# Patient Record
Sex: Female | Born: 1970
Health system: Southern US, Community
[De-identification: ages and names within clinical notes are randomized; demographics above are authoritative.]

## PROBLEM LIST (undated history)

## (undated) DIAGNOSIS — K589 Irritable bowel syndrome without diarrhea: Secondary | ICD-10-CM

## (undated) DIAGNOSIS — F32A Depression, unspecified: Secondary | ICD-10-CM

## (undated) DIAGNOSIS — Z860100 Personal history of colon polyps, unspecified: Secondary | ICD-10-CM

## (undated) DIAGNOSIS — T8859XA Other complications of anesthesia, initial encounter: Secondary | ICD-10-CM

## (undated) DIAGNOSIS — R7303 Prediabetes: Secondary | ICD-10-CM

## (undated) DIAGNOSIS — K921 Melena: Secondary | ICD-10-CM

## (undated) DIAGNOSIS — F329 Major depressive disorder, single episode, unspecified: Secondary | ICD-10-CM

## (undated) DIAGNOSIS — F909 Attention-deficit hyperactivity disorder, unspecified type: Secondary | ICD-10-CM

## (undated) DIAGNOSIS — T4145XA Adverse effect of unspecified anesthetic, initial encounter: Secondary | ICD-10-CM

## (undated) DIAGNOSIS — K5790 Diverticulosis of intestine, part unspecified, without perforation or abscess without bleeding: Secondary | ICD-10-CM

## (undated) DIAGNOSIS — I8390 Asymptomatic varicose veins of unspecified lower extremity: Secondary | ICD-10-CM

## (undated) DIAGNOSIS — G2581 Restless legs syndrome: Secondary | ICD-10-CM

## (undated) DIAGNOSIS — J45909 Unspecified asthma, uncomplicated: Secondary | ICD-10-CM

## (undated) DIAGNOSIS — K219 Gastro-esophageal reflux disease without esophagitis: Secondary | ICD-10-CM

## (undated) DIAGNOSIS — G43909 Migraine, unspecified, not intractable, without status migrainosus: Secondary | ICD-10-CM

## (undated) DIAGNOSIS — T7840XA Allergy, unspecified, initial encounter: Secondary | ICD-10-CM

## (undated) DIAGNOSIS — M1990 Unspecified osteoarthritis, unspecified site: Secondary | ICD-10-CM

## (undated) DIAGNOSIS — M5430 Sciatica, unspecified side: Secondary | ICD-10-CM

## (undated) DIAGNOSIS — N6489 Other specified disorders of breast: Secondary | ICD-10-CM

## (undated) DIAGNOSIS — E785 Hyperlipidemia, unspecified: Secondary | ICD-10-CM

## (undated) DIAGNOSIS — K649 Unspecified hemorrhoids: Secondary | ICD-10-CM

## (undated) DIAGNOSIS — F419 Anxiety disorder, unspecified: Secondary | ICD-10-CM

## (undated) DIAGNOSIS — F4310 Post-traumatic stress disorder, unspecified: Secondary | ICD-10-CM

## (undated) DIAGNOSIS — S8000XA Contusion of unspecified knee, initial encounter: Secondary | ICD-10-CM

## (undated) DIAGNOSIS — K5900 Constipation, unspecified: Secondary | ICD-10-CM

## (undated) HISTORY — DX: Contusion of unspecified knee, initial encounter: S80.00XA

## (undated) HISTORY — PX: UPPER GI ENDOSCOPY: SHX6162

## (undated) HISTORY — PX: HEMORRHOID BANDING: SHX5850

## (undated) HISTORY — DX: Depression, unspecified: F32.A

## (undated) HISTORY — DX: Allergy, unspecified, initial encounter: T78.40XA

## (undated) HISTORY — DX: Gastro-esophageal reflux disease without esophagitis: K21.9

## (undated) HISTORY — DX: Hyperlipidemia, unspecified: E78.5

## (undated) HISTORY — DX: Melena: K92.1

## (undated) HISTORY — DX: Attention-deficit hyperactivity disorder, unspecified type: F90.9

## (undated) HISTORY — DX: Anxiety disorder, unspecified: F41.9

## (undated) HISTORY — DX: Major depressive disorder, single episode, unspecified: F32.9

## (undated) HISTORY — DX: Post-traumatic stress disorder, unspecified: F43.10

## (undated) HISTORY — DX: Other specified disorders of breast: N64.89

## (undated) HISTORY — PX: COLONOSCOPY: SHX174

## (undated) HISTORY — DX: Unspecified osteoarthritis, unspecified site: M19.90

## (undated) HISTORY — PX: RADIOFREQUENCY ABLATION NERVES: SUR1070

---

## 2007-03-02 ENCOUNTER — Ambulatory Visit (HOSPITAL_COMMUNITY): Admission: RE | Admit: 2007-03-02 | Discharge: 2007-03-02 | Payer: Self-pay | Admitting: *Deleted

## 2007-03-02 ENCOUNTER — Encounter (INDEPENDENT_AMBULATORY_CARE_PROVIDER_SITE_OTHER): Payer: Self-pay | Admitting: Specialist

## 2007-04-16 ENCOUNTER — Ambulatory Visit (HOSPITAL_COMMUNITY): Admission: RE | Admit: 2007-04-16 | Discharge: 2007-04-16 | Payer: Self-pay | Admitting: *Deleted

## 2010-07-14 ENCOUNTER — Other Ambulatory Visit: Admission: RE | Admit: 2010-07-14 | Discharge: 2010-07-14 | Payer: Self-pay | Admitting: Family Medicine

## 2010-08-04 ENCOUNTER — Encounter: Admission: RE | Admit: 2010-08-04 | Discharge: 2010-08-04 | Payer: Self-pay | Admitting: Family Medicine

## 2010-09-03 ENCOUNTER — Encounter: Admission: RE | Admit: 2010-09-03 | Discharge: 2010-09-03 | Payer: Self-pay | Admitting: Surgery

## 2010-10-31 LAB — HM DIABETES EYE EXAM

## 2010-11-20 ENCOUNTER — Encounter: Payer: Self-pay | Admitting: Surgery

## 2011-03-15 NOTE — Op Note (Signed)
NAMEAVY, BARLETT                  ACCOUNT NO.:  [id number]   MEDICAL RECORD NO.:  [id number]          PATIENT TYPE:  AMB   LOCATION:  ENDO                         FACILITY:  [facility]   PHYSICIAN:  Georgiana Spinner, M.D.    DATE OF BIRTH:  [birth year]   DATE OF PROCEDURE:  04/16/2007  DATE OF DISCHARGE:                               OPERATIVE REPORT   PROCEDURE:  Colonoscopy.   INDICATIONS:  Colon cancer screening with a family history of colon  polyps, colon cancer occurring at age 77 in her father.   ANESTHESIA:  Demerol 120 mg, Benadryl 50 mg, Versed 12.5 mg.   PROCEDURE:  With the patient mildly sedated in the left lateral  decubitus position, the Pentax videoscopic colonoscope was inserted in  the rectum and passed rather easily to the cecum identified by the  ileocecal valve and appendiceal orifice both of which were photographed.  From this point the colonoscope was slowly withdrawn taking  circumferential views of colonic mucosa stopping only in the rectum  which appeared normal on direct and showed hemorrhoids on retroflex  view.  The endoscope was straightened and withdrawn.  The patient's  vital signs and pulse oximeter remained stable.  The patient tolerated  the procedure well without apparent complications.   FINDINGS:  Internal hemorrhoids, otherwise unremarkable exam.   PLAN:  Repeat examination in 5 years due to family history.           ______________________________  Georgiana Spinner, M.D.     GMO/MEDQ  D:  04/16/2007  T:  04/16/2007  Job:  [id number]

## 2011-03-18 NOTE — Op Note (Signed)
Pamela Novak, Pamela Novak                  ACCOUNT NO.:  [id number]   MEDICAL RECORD NO.:  [id number]          PATIENT TYPE:  AMB   LOCATION:  ENDO                         FACILITY:  [facility]   PHYSICIAN:  Georgiana Spinner, M.D.    DATE OF BIRTH:  1970-12-10   DATE OF PROCEDURE:  03/02/2007  DATE OF DISCHARGE:                               OPERATIVE REPORT   PROCEDURE:  Upper endoscopy.   INDICATIONS:  Abdominal pain.   ANESTHESIA:  Demerol 100 mg, Versed 10 mg.   PROCEDURE:  With the patient mildly sedated in the left lateral  decubitus position, the Pentax videoscopic endoscope was inserted in the  mouth and passed under direct vision through the esophagus, which  appeared normal except the distal esophagus showed some changes of  possible reflux.  This was photographed and biopsied.  We entered into  the stomach.  Fundus, body, antrum, duodenal bulb, second portion of the  duodenum were all visualized.  From this point the endoscope was slowly  withdrawn taking circumferential views of the duodenal and gastric  mucosa until the endoscope was pulled back into the stomach, placed in  retroflexion to view the stomach from below.  The endoscope was  straightened and withdrawn taking circumferential views of the remaining  gastric and esophageal mucosa.  The patient's vital signs and pulse  oximetry remained stable.  The patient tolerated the procedure well  without apparent complications.   FINDINGS:  1. A fairly unremarkable examination.  2. Biopsy of distal esophagus.   PLAN:  Await biopsy report.  Proceed to CT scan of abdomen and pelvis to  evaluate further.           ______________________________  Georgiana Spinner, M.D.     GMO/MEDQ  D:  03/02/2007  T:  03/02/2007  Job:  [id number]

## 2012-07-31 LAB — HM MAMMOGRAPHY: HM Mammogram: NORMAL

## 2012-07-31 LAB — HM PAP SMEAR: HM Pap smear: NORMAL

## 2012-08-16 ENCOUNTER — Other Ambulatory Visit: Payer: Self-pay | Admitting: Family Medicine

## 2012-08-16 DIAGNOSIS — Z1231 Encounter for screening mammogram for malignant neoplasm of breast: Secondary | ICD-10-CM

## 2012-08-17 ENCOUNTER — Other Ambulatory Visit: Payer: Self-pay | Admitting: Gastroenterology

## 2012-08-17 LAB — HM COLONOSCOPY

## 2012-08-20 ENCOUNTER — Ambulatory Visit
Admission: RE | Admit: 2012-08-20 | Discharge: 2012-08-20 | Disposition: A | Discharge status: Home / Self Care | Payer: BC Managed Care – PPO | Source: Ambulatory Visit | Attending: Family Medicine | Admitting: Family Medicine

## 2012-08-20 DIAGNOSIS — Z1231 Encounter for screening mammogram for malignant neoplasm of breast: Secondary | ICD-10-CM

## 2012-08-23 ENCOUNTER — Other Ambulatory Visit: Payer: Self-pay | Admitting: Obstetrics and Gynecology

## 2012-09-11 ENCOUNTER — Emergency Department (HOSPITAL_COMMUNITY)

## 2012-09-11 ENCOUNTER — Emergency Department (HOSPITAL_COMMUNITY)
Admission: EM | Admit: 2012-09-11 | Discharge: 2012-09-11 | Disposition: A | Discharge status: Home / Self Care | Attending: Emergency Medicine | Admitting: Emergency Medicine

## 2012-09-11 ENCOUNTER — Encounter (HOSPITAL_COMMUNITY): Payer: Self-pay | Admitting: Emergency Medicine

## 2012-09-11 DIAGNOSIS — S2002XA Contusion of left breast, initial encounter: Secondary | ICD-10-CM

## 2012-09-11 DIAGNOSIS — W1789XA Other fall from one level to another, initial encounter: Secondary | ICD-10-CM | POA: Insufficient documentation

## 2012-09-11 DIAGNOSIS — S20219A Contusion of unspecified front wall of thorax, initial encounter: Secondary | ICD-10-CM

## 2012-09-11 DIAGNOSIS — Y929 Unspecified place or not applicable: Secondary | ICD-10-CM | POA: Insufficient documentation

## 2012-09-11 DIAGNOSIS — S2000XA Contusion of breast, unspecified breast, initial encounter: Secondary | ICD-10-CM | POA: Insufficient documentation

## 2012-09-11 DIAGNOSIS — Y9302 Activity, running: Secondary | ICD-10-CM | POA: Insufficient documentation

## 2012-09-11 MED ORDER — KETOROLAC TROMETHAMINE 30 MG/ML IJ SOLN
30.0000 mg | Freq: Once | INTRAMUSCULAR | Status: AC
  Administered 2012-09-11: 30 mg via INTRAVENOUS
  Filled 2012-09-11: qty 1

## 2012-09-11 MED ORDER — OXYCODONE-ACETAMINOPHEN 5-325 MG PO TABS
1.0000 | ORAL_TABLET | Freq: Once | ORAL | Status: AC
  Administered 2012-09-11: 1 via ORAL
  Filled 2012-09-11: qty 1

## 2012-09-11 MED ORDER — OXYCODONE-ACETAMINOPHEN 5-325 MG PO TABS: 1.0000 | ORAL_TABLET | ORAL | Status: DC | PRN

## 2012-09-11 MED ORDER — SODIUM CHLORIDE 0.9 % IV SOLN
Freq: Once | INTRAVENOUS | Status: AC
  Administered 2012-09-11: 21:00:00 via INTRAVENOUS

## 2012-09-11 MED ORDER — HYDROMORPHONE HCL PF 1 MG/ML IJ SOLN
1.0000 mg | Freq: Once | INTRAMUSCULAR | Status: AC
  Administered 2012-09-11: 1 mg via INTRAVENOUS
  Filled 2012-09-11: qty 1

## 2012-09-11 NOTE — ED Provider Notes (Signed)
History     CSN: [id number]  Arrival date & time 09/11/12  2130   First MD Initiated Contact with Patient 09/11/12 2002      Chief Complaint  Patient presents with  . Fall    (Consider location/radiation/quality/duration/timing/severity/associated sxs/prior treatment) HPI Comments: Patient was running in the rain slipped and fell onto laptop that fell from her arms and landed on it's edge.  The edge hit her under the L breast.  Now pain radiates into breast and deep in chest.  Also hit L elbow and knee. She applied ice to the area without relief  Patient is a 41 y.o. female presenting with fall. The history is provided by the patient.  Fall The accident occurred 1 to 2 hours ago. The fall occurred while walking. She fell from a height of 3 to 5 ft. She landed on concrete. Point of impact: L breast and L ribs. The pain is at a severity of 10/10. The pain is severe. She was ambulatory at the scene. There was no entrapment after the fall. There was no drug use involved in the accident. There was no alcohol use involved in the accident. Pertinent negatives include no abdominal pain and no nausea.    History reviewed. No pertinent past medical history.  History reviewed. No pertinent past surgical history.  No family history on file.  History  Substance Use Topics  . Smoking status: Never Smoker   . Smokeless tobacco: Not on file  . Alcohol Use: No    OB History    Grav Para Term Preterm Abortions TAB SAB Ect Mult Living                  Review of Systems  Constitutional: Positive for activity change.  HENT: Negative for neck pain and neck stiffness.   Respiratory: Negative for cough and shortness of breath.   Cardiovascular: Positive for chest pain.  Gastrointestinal: Negative for nausea and abdominal pain.  Musculoskeletal: Negative for back pain.  Skin: Negative for rash and wound.  Neurological: Negative for weakness.  Hematological: Negative.     Psychiatric/Behavioral: Negative.     Allergies  Flagyl  Home Medications   Current Outpatient Rx  Name  Route  Sig  Dispense  Refill  . BUPROPION HCL ER (XL) 150 MG PO TB24   Oral   Take 150 mg by mouth daily.         [name] Kitchen MONTELUKAST SODIUM 10 MG PO TABS   Oral   Take 10 mg by mouth daily.         [name] Kitchen PROBIOTIC DAILY PO   Oral   Take 1 capsule by mouth daily.           BP 124/84  Pulse 96  Temp 98.5 F (36.9 C) (Oral)  Resp 15  SpO2 100%  LMP 09/02/2012  Physical Exam  Nursing note and vitals reviewed. Constitutional: She is oriented to person, place, and time. She appears well-developed and well-nourished.  HENT:  Head: Normocephalic.  Eyes: Pupils are equal, round, and reactive to light.  Neck: Normal range of motion.  Cardiovascular: Normal rate.   Pulmonary/Chest: Effort normal. No respiratory distress. She has no wheezes. She has no rales. She exhibits tenderness.    Abdominal: Soft. Bowel sounds are normal. She exhibits no distension. There is tenderness.  Musculoskeletal: Normal range of motion. She exhibits tenderness.       Left elbow: She exhibits normal range of motion, no swelling, no effusion, no  deformity and no laceration. tenderness found.       Left knee: She exhibits erythema. She exhibits normal range of motion, no swelling, no effusion, no ecchymosis, no deformity, no laceration, normal alignment, no LCL laxity and no bony tenderness. no tenderness found.       Arms: Neurological: She is alert and oriented to person, place, and time.  Skin: Skin is warm. No rash noted. No erythema.    ED Course  Procedures (including critical care time)  Labs Reviewed - No data to display Dg Ribs Unilateral W/chest Left  09/11/2012  *RADIOLOGY REPORT*  Clinical Data: Fall  LEFT RIBS AND CHEST - 3+ VIEW  Comparison: None  Findings: Heart size is normal.  No pleural effusion or edema.  No airspace consolidation identified.  Dedicated views of the left  ribs show no displaced rib fractures.  IMPRESSION:  1.  No acute findings identified.   Original Report Authenticated By: Signa Kell, M.D.      1. Chest wall contusion   2. Contusion of breast, left       MDM   Suspect rib fracture will xray and treat pain  Xray negative for rib fracture will  DC home with pan medication and PCP follow up      Arman Filter, NP 09/11/12 2152  Arman Filter, NP 09/11/12 2209

## 2012-09-11 NOTE — ED Notes (Signed)
Per patient, fell in parking lot at work and lap top  Jammed into left breast-no LOC, did not hit head

## 2012-09-11 NOTE — ED Provider Notes (Signed)
Medical screening examination/treatment/procedure(s) were performed by non-physician practitioner and as supervising physician I was immediately available for consultation/collaboration.  Raeford Razor, MD 09/11/12 [phone]

## 2012-09-11 NOTE — Progress Notes (Signed)
Pt confirms pcp as Pamela Novak EPIC updated

## 2012-09-21 ENCOUNTER — Encounter (HOSPITAL_COMMUNITY): Payer: Self-pay | Admitting: Pharmacist

## 2012-09-26 ENCOUNTER — Encounter (INDEPENDENT_AMBULATORY_CARE_PROVIDER_SITE_OTHER): Payer: Self-pay | Admitting: Surgery

## 2012-09-26 ENCOUNTER — Ambulatory Visit (INDEPENDENT_AMBULATORY_CARE_PROVIDER_SITE_OTHER): Payer: Federal, State, Local not specified - PPO | Admitting: Surgery

## 2012-09-26 VITALS — BP 140/80 | HR 78 | Temp 99.2°F | Resp 18 | Ht 67.0 in | Wt 164.0 lb

## 2012-09-26 DIAGNOSIS — D1739 Benign lipomatous neoplasm of skin and subcutaneous tissue of other sites: Secondary | ICD-10-CM

## 2012-09-26 DIAGNOSIS — D170 Benign lipomatous neoplasm of skin and subcutaneous tissue of head, face and neck: Secondary | ICD-10-CM | POA: Insufficient documentation

## 2012-09-26 DIAGNOSIS — K648 Other hemorrhoids: Secondary | ICD-10-CM

## 2012-09-26 NOTE — Patient Instructions (Signed)
ANORECTAL PROCEDURES: 1.  Tub soaks 2-3 times daily in warm water (may add Epsom salts if desired) 2.  Stool softener for one month (store brand Miralax or Colace) 3.  Avoid toilet paper - use baby wipes or Tucks pads 4.  Increase water intake - 6-8 glasses daily 5.  Apply dry pad to area until drainage stops 

## 2012-09-26 NOTE — Progress Notes (Signed)
General Surgery [facility]  Chief Complaint  Patient presents with  . Follow-up    eval hems and neck lipoma - last seen in 2011    HISTORY: The patient is a 41 year old white female last evaluated in my practice in 2011. She presents today with complaints of an enlarging soft tissue mass at the sternal notch. She is also had persistent problems with internal hemorrhoids noted on colonoscopy and on gynecologic examination. She complains of pain, burning, and intermittent bleeding. She notes prolapse requiring manual reduction. She notes chronic constipation.  Past Medical History  Diagnosis Date  . Anxiety   . Traumatic hematoma of knee   . Breast hematoma      Current Outpatient Prescriptions  Medication Sig Dispense Refill  . buPROPion (WELLBUTRIN XL) 150 MG 24 hr tablet Take 150 mg by mouth daily.      [name] Kitchen ibuprofen (ADVIL,MOTRIN) 800 MG tablet Take 800 mg by mouth every 8 (eight) hours as needed.      . montelukast (SINGULAIR) 10 MG tablet Take 10 mg by mouth daily.      [name] Kitchen oxyCODONE-acetaminophen (PERCOCET) 10-325 MG per tablet Take 1 tablet by mouth every 4 (four) hours as needed.      . pramoxine-hydrocortisone (ANALPRAM HC) cream Apply 1 application topically daily as needed. For hemorrhoids      . Probiotic Product (PROBIOTIC DAILY PO) Take 1 capsule by mouth daily.      [name] Kitchen HYDROcodone-acetaminophen (VICODIN) 5-500 MG per tablet Take 1 tablet by mouth every 4 (four) hours as needed. For pain         Allergies  Allergen Reactions  . Flagyl (Metronidazole) Rash     Family History  Problem Relation Age of Onset  . Heart disease Mother   . Arthritis Mother   . Hyperlipidemia Mother      History   Social History  . Marital Status: Married    Spouse Name: N/A    Number of Children: N/A  . Years of Education: N/A   Social History Main Topics  . Smoking status: Never Smoker   . Smokeless tobacco: None  . Alcohol Use: No  . Drug Use: No  .  Sexually Active:    Other Topics Concern  . None   Social History Narrative  . None     REVIEW OF SYSTEMS - PERTINENT POSITIVES ONLY: Intermittent bleeding with bowel movements. Chronic prolapse requiring manual reduction.  EXAM: Filed Vitals:   09/26/12 0959  BP: 140/80  Pulse: 78  Temp: 99.2 F (37.3 C)  Resp: 18    HEENT: normocephalic; pupils equal and reactive; sclerae clear; dentition good; mucous membranes moist NECK:  symmetric on extension; no palpable anterior or posterior cervical lymphadenopathy; no supraclavicular masses; no tenderness; soft tissue is prominent at the sternal notch; no discrete lipoma; prominent subcutaneous fat pad CHEST: clear to auscultation bilaterally without rales, rhonchi, or wheezes CARDIAC: regular rate and rhythm without significant murmur; peripheral pulses are full GU:  External examination shows circumferential skin tags. There is moderate prolapse. There is no sign of fissure or fistula. There are a few prominent external hemorrhoidal veins. Digital rectal examination shows edema in the anal canal. Anoscopy is performed and a 360 view shows grade 2-3 internal hemorrhoids. There is no ulceration. There is no active bleeding. EXT:  non-tender without edema; no deformity NEURO: no gross focal deficits; no sign of tremor   LABORATORY RESULTS: See [facility] (CHL-Epic) for most recent results  RADIOLOGY RESULTS: See [facility] (CHL-Epic) for most recent results   IMPRESSION: #1 grade 3 internal hemorrhoids with prolapse, no active bleeding #2 prominent fat pad at sternal notch, no evidence of neoplasm  PLAN: I discussed the above findings with the patient and her husband. I recommended that they consider the PPH procedure for management of her hemorrhoid disease. I do not perform that surgery. I am going to ask her to be seen in consultation by my partner, Dr. Chevis Pretty, for consideration for this procedure. I provided  them with written literature to review. I have suggested that she begin taking MiraLAX daily. She will continue with good perineal hygiene.  Consultation with Dr. Chevis Pretty has been arranged.  Velora Heckler, MD, FACS General & Endocrine Surgery [facility]   Visit Diagnoses: 1. Prolapsed internal hemorrhoids, circumferential   2. Lipoma of skin and subcutaneous tissue of neck     Primary Care Physician: Gaye Alken, MD

## 2012-10-01 ENCOUNTER — Ambulatory Visit (INDEPENDENT_AMBULATORY_CARE_PROVIDER_SITE_OTHER): Payer: Federal, State, Local not specified - PPO | Admitting: Internal Medicine

## 2012-10-01 ENCOUNTER — Encounter (HOSPITAL_COMMUNITY)
Admission: RE | Admit: 2012-10-01 | Discharge: 2012-10-01 | Disposition: A | Discharge status: Home / Self Care | Payer: Federal, State, Local not specified - PPO | Source: Ambulatory Visit | Attending: Obstetrics & Gynecology | Admitting: Obstetrics & Gynecology

## 2012-10-01 ENCOUNTER — Encounter: Payer: Self-pay | Admitting: Internal Medicine

## 2012-10-01 ENCOUNTER — Encounter (HOSPITAL_COMMUNITY): Payer: Self-pay

## 2012-10-01 VITALS — BP 120/82 | HR 90 | Temp 100.0°F | Ht 67.0 in | Wt 163.0 lb

## 2012-10-01 DIAGNOSIS — F411 Generalized anxiety disorder: Secondary | ICD-10-CM

## 2012-10-01 DIAGNOSIS — K649 Unspecified hemorrhoids: Secondary | ICD-10-CM

## 2012-10-01 DIAGNOSIS — Z13220 Encounter for screening for lipoid disorders: Secondary | ICD-10-CM

## 2012-10-01 DIAGNOSIS — Z1329 Encounter for screening for other suspected endocrine disorder: Secondary | ICD-10-CM

## 2012-10-01 DIAGNOSIS — Z0000 Encounter for general adult medical examination without abnormal findings: Secondary | ICD-10-CM

## 2012-10-01 DIAGNOSIS — Z130 Encounter for screening for diseases of the blood and blood-forming organs and certain disorders involving the immune mechanism: Secondary | ICD-10-CM

## 2012-10-01 DIAGNOSIS — Z131 Encounter for screening for diabetes mellitus: Secondary | ICD-10-CM

## 2012-10-01 DIAGNOSIS — F419 Anxiety disorder, unspecified: Secondary | ICD-10-CM

## 2012-10-01 DIAGNOSIS — Z1321 Encounter for screening for nutritional disorder: Secondary | ICD-10-CM

## 2012-10-01 HISTORY — DX: Other complications of anesthesia, initial encounter: T88.59XA

## 2012-10-01 HISTORY — DX: Adverse effect of unspecified anesthetic, initial encounter: T41.45XA

## 2012-10-01 LAB — CBC
HCT: 40.5 % (ref 36.0–46.0)
Hemoglobin: 13.7 g/dL (ref 12.0–15.0)
MCH: 28.9 pg (ref 26.0–34.0)
MCHC: 33.8 g/dL (ref 30.0–36.0)
MCV: 85.4 fL (ref 78.0–100.0)
Platelets: 265 10*3/uL (ref 150–400)
RBC: 4.74 MIL/uL (ref 3.87–5.11)
RDW: 12.6 % (ref 11.5–15.5)
WBC: 6.5 10*3/uL (ref 4.0–10.5)

## 2012-10-01 LAB — SURGICAL PCR SCREEN
MRSA, PCR: NEGATIVE
Staphylococcus aureus: NEGATIVE

## 2012-10-01 MED ORDER — BETAMETHASONE DIPROPIONATE AUG 0.05 % EX OINT: TOPICAL_OINTMENT | Freq: Two times a day (BID) | CUTANEOUS | Status: DC

## 2012-10-01 MED ORDER — FLUOXETINE HCL 20 MG PO TABS: 20.0000 mg | ORAL_TABLET | Freq: Every day | ORAL | Status: DC

## 2012-10-01 NOTE — Patient Instructions (Addendum)
20 Pamela Novak  10/01/2012   Your procedure is scheduled on:  10/04/12  Enter through the Main Entrance of [facility] at 1130 AM.  Pick up the phone at the desk and dial [phone].   Call this number if you have problems the morning of surgery: [phone]   Remember:   Do not eat food:After Midnight.  Do not drink clear liquids: 4 Hours before arrival.  Take these medicines the morning of surgery with A SIP OF WATER: Xanax, Wellbutrin, or Pain medication   Do not wear jewelry, make-up or nail polish.  Do not wear lotions, powders, or perfumes. You may wear deodorant.  Do not shave 48 hours prior to surgery.  Do not bring valuables to the hospital.  Contacts, dentures or bridgework may not be worn into surgery.  Leave suitcase in the car. After surgery it may be brought to your room.  For patients admitted to the hospital, checkout time is 11:00 AM the day of discharge.   Patients discharged the day of surgery will not be allowed to drive home.  Name and phone number of your driver: Pamela Novak  spouse  Special Instructions: Shower using CHG 2 nights before surgery and the night before surgery.  If you shower the day of surgery use CHG.  Use special wash - you have one bottle of CHG for all showers.  You should use approximately 1/3 of the bottle for each shower.   Please read over the following fact sheets that you were given: MRSA Information

## 2012-10-01 NOTE — Patient Instructions (Addendum)

## 2012-10-01 NOTE — Progress Notes (Signed)
HPI  Pt presents to the clinic today to establish care. Her only concern is that she wonders if she can change her Wellbutrin to Prozac, to better control her anxiety so that she can use her xanax less frequently. She has had a long standing history of anxiety without panic attacks. She feels like she has been on Wellbutrin so long that maybe it is not as effective as it use to be.  Past Medical History  Diagnosis Date  . Anxiety   . Traumatic hematoma of knee   . Breast hematoma   . Complication of anesthesia     "woke up during colonoscopy"  . Depression   . Allergy   . Hyperlipidemia   . GERD (gastroesophageal reflux disease)   . Blood in stool     Current Outpatient Prescriptions  Medication Sig Dispense Refill  . ALPRAZolam (XANAX) 0.25 MG tablet Take 0.25 mg by mouth 3 (three) times daily as needed.      [name] Kitchen buPROPion (WELLBUTRIN XL) 150 MG 24 hr tablet Take 150 mg by mouth daily.      . montelukast (SINGULAIR) 10 MG tablet Take 10 mg by mouth daily.      [name] Kitchen oxyCODONE-acetaminophen (PERCOCET) 10-325 MG per tablet Take 1 tablet by mouth every 6 (six) hours as needed.       . polyethylene glycol (MIRALAX / GLYCOLAX) packet Take 17 g by mouth daily.      . Probiotic Product (PROBIOTIC DAILY PO) Take 1 capsule by mouth daily.      . traMADol (ULTRAM) 50 MG tablet Take 100 mg by mouth every 6 (six) hours as needed.       . pramoxine-hydrocortisone (ANALPRAM HC) cream Apply 1 application topically daily as needed. For hemorrhoids        Allergies  Allergen Reactions  . Flagyl (Metronidazole) Rash    Family History  Problem Relation Age of Onset  . Heart disease Mother   . Arthritis Mother   . Hyperlipidemia Mother   . Hypertension Mother   . Cancer Father     Colon Cancer-Polyps  . Hypertension Father   . Hashimoto's thyroiditis Sister   . Diabetes Sister   . Cancer Paternal Grandfather     Colon Cancer  . Hashimoto's thyroiditis Sister     History   Social History    . Marital Status: Married    Spouse Name: N/A    Number of Children: 2  . Years of Education: 12+   Occupational History  . adm officer    Social History Main Topics  . Smoking status: Never Smoker   . Smokeless tobacco: Never Used  . Alcohol Use: No  . Drug Use: No  . Sexually Active: Yes   Other Topics Concern  . Not on file   Social History Narrative   Regular exercise-noCaffeine Use-yes    ROS:  Constitutional: Denies fever, malaise, fatigue, headache or abrupt weight changes.  HEENT: Denies eye pain, eye redness, ear pain, ringing in the ears, wax buildup, runny nose, nasal congestion, bloody nose, or sore throat. Respiratory: Denies difficulty breathing, shortness of breath, cough or sputum production.   Cardiovascular: Denies chest pain, chest tightness, palpitations or swelling in the hands or feet.  Gastrointestinal: Pt reports hemorhoids. Denies abdominal pain, bloating, constipation, diarrhea or blood in the stool.  GU: Denies frequency, urgency, pain with urination, blood in urine, odor or discharge. Musculoskeletal: Denies decrease in range of motion, difficulty with gait, muscle pain or joint  pain and swelling.  Skin: Denies redness, rashes, lesions or ulcercations.  Neurological: Denies dizziness, difficulty with memory, difficulty with speech or problems with balance and coordination.   No other specific complaints in a complete review of systems (except as listed in HPI above).  PE:  BP 120/82  Pulse 90  Temp 100 F (37.8 C) (Oral)  Ht 5\' 7"  (1.702 m)  Wt 163 lb (73.936 kg)  BMI 25.53 kg/m2  SpO2 96%  LMP 09/29/2012 Wt Readings from Last 3 Encounters:  10/01/12 163 lb (73.936 kg)  10/01/12 158 lb (71.668 kg)  09/26/12 164 lb (74.39 kg)    General: Appears her stated age, well developed, well nourished in NAD. HEENT: Head: normal shape and size; Eyes: sclera white, no icterus, conjunctiva pink, PERRLA and EOMs intact; Ears: Tm's gray and intact,  normal light reflex; Nose: mucosa pink and moist, septum midline; Throat/Mouth: Teeth present, mucosa pink and moist, no lesions or ulcerations noted.  Neck: Normal range of motion. Neck supple, trachea midline. No massses, lumps or thyromegaly present.  Cardiovascular: Normal rate and rhythm. S1,S2 noted.  No murmur, rubs or gallops noted. No JVD or BLE edema. No carotid bruits noted. Pulmonary/Chest: Normal effort and positive vesicular breath sounds. No respiratory distress. No wheezes, rales or ronchi noted.  Abdomen: Soft and nontender. Normal bowel sounds, no bruits noted. No distention or masses noted. Liver, spleen and kidneys non palpable. Musculoskeletal: Normal range of motion. No signs of joint swelling. No difficulty with gait.  Neurological: Alert and oriented. Cranial nerves II-XII intact. Coordination normal. +DTRs bilaterally. Psychiatric: Mood and affect normal. Behavior is normal. Judgment and thought content normal.     Assessment and Plan:  Preventative Health Maintenance:  Will defer labs today per patient preference. She usually gets them done at her OB/GYN. Not able to get the flu shot today due to being febrile without illness Will defer Tdap until next visit per patient preference. Continue diet and exercise  Anxiety, chronic problem with additional workup required:  Will change Wellbutrin to Prozac 20 mg daily Encouraged use of relaxation techniques  Hemorrhoids, chronic problem with additional workup required:  Scheduled for hemorrhoidectomy on November 10, 2011 Refilled analpram  RTC in 1 month to evaluate effectiveness of prozac

## 2012-10-03 NOTE — H&P (Signed)
Pamela Novak is an 41 y.o. female with menorrhagia, dysmenorrhea, vulvar condyloma who declines hormonal tx.  Pertinent Gynecological History: Menses: flow is excessive with use of 8 pads or tampons on heaviest days and with severe dysmenorrhea Bleeding: dysfunctional uterine bleeding Contraception: vasectomy DES exposure: unknown Blood transfusions: none Sexually transmitted diseases: no past history Previous GYN Procedures: none  Last mammogram: normal Date: 08/20/2012 Last pap: normal Date: 08/24/2012 OB History: G2, P2   Menstrual History: Menarche age: 40 Patient's last menstrual period was 09/02/2012.    Past Medical History  Diagnosis Date  . Anxiety   . Traumatic hematoma of knee   . Breast hematoma   . Complication of anesthesia     "woke up during colonoscopy"  . Depression   . Allergy   . Hyperlipidemia   . GERD (gastroesophageal reflux disease)   . Blood in stool     Past Surgical History  Procedure Date  . Colonoscopy     Family History  Problem Relation Age of Onset  . Heart disease Mother   . Arthritis Mother   . Hyperlipidemia Mother   . Hypertension Mother   . Cancer Father     Colon Cancer-Polyps  . Hypertension Father   . Hashimoto's thyroiditis Sister   . Diabetes Sister   . Cancer Paternal Grandfather     Colon Cancer  . Hashimoto's thyroiditis Sister     Social History:  reports that she has never smoked. She has never used smokeless tobacco. She reports that she does not drink alcohol or use illicit drugs.  Allergies:  Allergies  Allergen Reactions  . Flagyl (Metronidazole) Rash    No prescriptions prior to admission    Review of Systems  Constitutional: Negative for fever and chills.  Eyes: Negative for blurred vision and double vision.  Respiratory: Negative for cough and hemoptysis.   Cardiovascular: Negative for chest pain and palpitations.  Gastrointestinal: Positive for abdominal pain. Negative for nausea and vomiting.   Musculoskeletal: Negative for myalgias.  Neurological: Negative for headaches.    Height 5\' 7"  (1.702 m), weight 162 lb (73.483 kg), last menstrual period 09/02/2012. Physical Exam  Constitutional: She is oriented to person, place, and time. She appears well-developed and well-nourished.  HENT:  Head: Normocephalic and atraumatic.  Neck: Normal range of motion. Neck supple.  Cardiovascular: Normal rate and regular rhythm.   Respiratory: Effort normal and breath sounds normal.  GI: Soft. Bowel sounds are normal. She exhibits no distension. There is tenderness. There is no rebound and no guarding.  Genitourinary: Vagina normal and uterus normal.  Neurological: She is alert and oriented to person, place, and time.  Skin: Skin is warm and dry.  Psychiatric: She has a normal mood and affect. Her behavior is normal.    No results found for this or any previous visit (from the past 24 hour(s)).  No results found.  Assessment/Plan: 41 yo with menorrhagia, dysmenorrhea and vulvar condyloma Plan for Dx L/S, H/S with NovaSure, removal of vulvar condyloma  Thimothy Barretta 10/03/2012, 8:20 PM

## 2012-10-04 ENCOUNTER — Encounter (HOSPITAL_COMMUNITY): Payer: Self-pay | Admitting: Anesthesiology

## 2012-10-04 ENCOUNTER — Ambulatory Visit (HOSPITAL_COMMUNITY)
Admission: RE | Admit: 2012-10-04 | Discharge: 2012-10-04 | Disposition: A | Discharge status: Home / Self Care | Payer: Federal, State, Local not specified - PPO | Source: Ambulatory Visit | Attending: Obstetrics & Gynecology | Admitting: Obstetrics & Gynecology

## 2012-10-04 ENCOUNTER — Ambulatory Visit (HOSPITAL_COMMUNITY): Payer: Federal, State, Local not specified - PPO | Admitting: Anesthesiology

## 2012-10-04 ENCOUNTER — Encounter (HOSPITAL_COMMUNITY)
Admission: RE | Disposition: A | Discharge status: Home / Self Care | Payer: Self-pay | Source: Ambulatory Visit | Attending: Obstetrics & Gynecology

## 2012-10-04 ENCOUNTER — Encounter (HOSPITAL_COMMUNITY): Payer: Self-pay | Admitting: *Deleted

## 2012-10-04 DIAGNOSIS — N920 Excessive and frequent menstruation with regular cycle: Secondary | ICD-10-CM | POA: Insufficient documentation

## 2012-10-04 DIAGNOSIS — N803 Endometriosis of pelvic peritoneum, unspecified: Secondary | ICD-10-CM | POA: Insufficient documentation

## 2012-10-04 DIAGNOSIS — N946 Dysmenorrhea, unspecified: Secondary | ICD-10-CM | POA: Insufficient documentation

## 2012-10-04 DIAGNOSIS — A630 Anogenital (venereal) warts: Secondary | ICD-10-CM | POA: Insufficient documentation

## 2012-10-04 DIAGNOSIS — Z01812 Encounter for preprocedural laboratory examination: Secondary | ICD-10-CM | POA: Insufficient documentation

## 2012-10-04 DIAGNOSIS — Z01818 Encounter for other preprocedural examination: Secondary | ICD-10-CM | POA: Insufficient documentation

## 2012-10-04 HISTORY — PX: DILITATION & CURRETTAGE/HYSTROSCOPY WITH NOVASURE ABLATION: SHX5568

## 2012-10-04 HISTORY — PX: ABLATION ON ENDOMETRIOSIS: SHX5787

## 2012-10-04 HISTORY — PX: CONDYLOMA EXCISION/FULGURATION: SHX1389

## 2012-10-04 HISTORY — PX: LAPAROSCOPY: SHX197

## 2012-10-04 SURGERY — LAPAROSCOPY OPERATIVE
Anesthesia: General | Site: Abdomen | Wound class: Clean Contaminated

## 2012-10-04 MED ORDER — FENTANYL CITRATE 0.05 MG/ML IJ SOLN
INTRAMUSCULAR | Status: DC | PRN
  Administered 2012-10-04: 100 ug via INTRAVENOUS
  Administered 2012-10-04: 50 ug via INTRAVENOUS
  Administered 2012-10-04: 100 ug via INTRAVENOUS

## 2012-10-04 MED ORDER — KETOROLAC TROMETHAMINE 30 MG/ML IJ SOLN
INTRAMUSCULAR | Status: DC | PRN
  Administered 2012-10-04: 30 mg via INTRAVENOUS
  Administered 2012-10-04: 30 mg via INTRAMUSCULAR

## 2012-10-04 MED ORDER — KETOROLAC TROMETHAMINE 30 MG/ML IJ SOLN
INTRAMUSCULAR | Status: AC
  Filled 2012-10-04: qty 1

## 2012-10-04 MED ORDER — GLYCOPYRROLATE 0.2 MG/ML IJ SOLN
INTRAMUSCULAR | Status: DC | PRN
  Administered 2012-10-04: 0.4 mg via INTRAVENOUS

## 2012-10-04 MED ORDER — SODIUM CHLORIDE 0.9 % IJ SOLN
INTRAMUSCULAR | Status: DC | PRN
  Administered 2012-10-04: 10 mL

## 2012-10-04 MED ORDER — MIDAZOLAM HCL 5 MG/5ML IJ SOLN
INTRAMUSCULAR | Status: DC | PRN
  Administered 2012-10-04 (×2): 2 mg via INTRAVENOUS

## 2012-10-04 MED ORDER — NEOSTIGMINE METHYLSULFATE 1 MG/ML IJ SOLN
INTRAMUSCULAR | Status: DC | PRN
  Administered 2012-10-04: 3 mg via INTRAVENOUS

## 2012-10-04 MED ORDER — OXYCODONE-ACETAMINOPHEN 5-325 MG PO TABS
ORAL_TABLET | ORAL | Status: AC
  Filled 2012-10-04: qty 1

## 2012-10-04 MED ORDER — BUPIVACAINE HCL (PF) 0.25 % IJ SOLN
INTRAMUSCULAR | Status: DC | PRN
  Administered 2012-10-04: 5 mL

## 2012-10-04 MED ORDER — ROCURONIUM BROMIDE 100 MG/10ML IV SOLN
INTRAVENOUS | Status: DC | PRN
  Administered 2012-10-04: 10 mg via INTRAVENOUS
  Administered 2012-10-04: 40 mg via INTRAVENOUS
  Administered 2012-10-04: 10 mg via INTRAVENOUS

## 2012-10-04 MED ORDER — HYDROMORPHONE HCL PF 1 MG/ML IJ SOLN
INTRAMUSCULAR | Status: AC
  Filled 2012-10-04: qty 1

## 2012-10-04 MED ORDER — MIDAZOLAM HCL 2 MG/2ML IJ SOLN
INTRAMUSCULAR | Status: AC
  Filled 2012-10-04: qty 2

## 2012-10-04 MED ORDER — OXYCODONE-ACETAMINOPHEN 10-325 MG PO TABS: 1.0000 | ORAL_TABLET | ORAL | Status: DC | PRN

## 2012-10-04 MED ORDER — BUPIVACAINE HCL (PF) 0.25 % IJ SOLN
INTRAMUSCULAR | Status: AC
  Filled 2012-10-04: qty 30

## 2012-10-04 MED ORDER — HYDROMORPHONE HCL PF 1 MG/ML IJ SOLN
INTRAMUSCULAR | Status: DC | PRN
  Administered 2012-10-04 (×2): 1 mg via INTRAVENOUS

## 2012-10-04 MED ORDER — GLYCOPYRROLATE 0.2 MG/ML IJ SOLN
INTRAMUSCULAR | Status: AC
  Filled 2012-10-04: qty 2

## 2012-10-04 MED ORDER — DEXTROSE IN LACTATED RINGERS 5 % IV SOLN: INTRAVENOUS | Status: DC

## 2012-10-04 MED ORDER — FENTANYL CITRATE 0.05 MG/ML IJ SOLN
INTRAMUSCULAR | Status: AC
  Administered 2012-10-04: 50 ug via INTRAVENOUS
  Filled 2012-10-04: qty 2

## 2012-10-04 MED ORDER — FENTANYL CITRATE 0.05 MG/ML IJ SOLN
25.0000 ug | INTRAMUSCULAR | Status: DC | PRN
  Administered 2012-10-04 (×3): 50 ug via INTRAVENOUS

## 2012-10-04 MED ORDER — ATROPINE SULFATE 0.4 MG/ML IJ SOLN
INTRAMUSCULAR | Status: AC
  Filled 2012-10-04: qty 1

## 2012-10-04 MED ORDER — LACTATED RINGERS IV SOLN
Freq: Once | INTRAVENOUS | Status: AC
  Administered 2012-10-04 (×2): via INTRAVENOUS

## 2012-10-04 MED ORDER — ONDANSETRON HCL 4 MG/2ML IJ SOLN
INTRAMUSCULAR | Status: DC | PRN
  Administered 2012-10-04: 4 mg via INTRAVENOUS

## 2012-10-04 MED ORDER — LIDOCAINE HCL (CARDIAC) 20 MG/ML IV SOLN
INTRAVENOUS | Status: DC | PRN
  Administered 2012-10-04: 80 mg via INTRAVENOUS

## 2012-10-04 MED ORDER — OXYCODONE-ACETAMINOPHEN 5-325 MG PO TABS
1.0000 | ORAL_TABLET | Freq: Once | ORAL | Status: AC
  Administered 2012-10-04: 1 via ORAL

## 2012-10-04 MED ORDER — FENTANYL CITRATE 0.05 MG/ML IJ SOLN
INTRAMUSCULAR | Status: AC
  Filled 2012-10-04: qty 5

## 2012-10-04 MED ORDER — ROCURONIUM BROMIDE 50 MG/5ML IV SOLN
INTRAVENOUS | Status: AC
  Filled 2012-10-04: qty 1

## 2012-10-04 MED ORDER — ONDANSETRON HCL 4 MG/2ML IJ SOLN
INTRAMUSCULAR | Status: AC
  Filled 2012-10-04: qty 2

## 2012-10-04 MED ORDER — PROPOFOL 10 MG/ML IV EMUL
INTRAVENOUS | Status: DC | PRN
  Administered 2012-10-04: 200 mg via INTRAVENOUS

## 2012-10-04 MED ORDER — LIDOCAINE HCL (CARDIAC) 20 MG/ML IV SOLN
INTRAVENOUS | Status: AC
  Filled 2012-10-04: qty 5

## 2012-10-04 MED ORDER — SILVER SULFADIAZINE 1 % EX CREA
TOPICAL_CREAM | CUTANEOUS | Status: AC
  Filled 2012-10-04: qty 50

## 2012-10-04 MED ORDER — PROPOFOL 10 MG/ML IV EMUL
INTRAVENOUS | Status: AC
  Filled 2012-10-04: qty 20

## 2012-10-04 SURGICAL SUPPLY — 37 items
ABLATOR ENDOMETRIAL BIPOLAR (ABLATOR) ×2 IMPLANT
ADH SKN CLS APL DERMABOND .7 (GAUZE/BANDAGES/DRESSINGS) ×1
CABLE HIGH FREQUENCY MONO STRZ (ELECTRODE) ×1 IMPLANT
CATH ROBINSON RED A/P 16FR (CATHETERS) ×2 IMPLANT
CHLORAPREP W/TINT 26ML (MISCELLANEOUS) ×3 IMPLANT
CLOTH BEACON ORANGE TIMEOUT ST (SAFETY) ×2 IMPLANT
CONTAINER PREFILL 10% NBF 15ML (MISCELLANEOUS) ×2 IMPLANT
CONTAINER PREFILL 10% NBF 60ML (FORM) IMPLANT
COVER TABLE BACK 60X90 (DRAPES) ×1 IMPLANT
DERMABOND ADVANCED (GAUZE/BANDAGES/DRESSINGS) ×1
DERMABOND ADVANCED .7 DNX12 (GAUZE/BANDAGES/DRESSINGS) ×1 IMPLANT
DRESSING TELFA 8X3 (GAUZE/BANDAGES/DRESSINGS) ×2 IMPLANT
ELECT REM PT RETURN 9FT ADLT (ELECTROSURGICAL) ×2
ELECTRODE REM PT RTRN 9FT ADLT (ELECTROSURGICAL) IMPLANT
GAUZE SPONGE 4X4 16PLY XRAY LF (GAUZE/BANDAGES/DRESSINGS) ×1 IMPLANT
GLOVE BIO SURGEON STRL SZ 6 (GLOVE) ×2 IMPLANT
GLOVE BIOGEL PI IND STRL 6 (GLOVE) ×2 IMPLANT
GLOVE BIOGEL PI INDICATOR 6 (GLOVE) ×2
GOWN PREVENTION PLUS LG XLONG (DISPOSABLE) ×3 IMPLANT
GOWN STRL REIN XL XLG (GOWN DISPOSABLE) ×3 IMPLANT
NDL INSUFFLATION 14GA 120MM (NEEDLE) ×1 IMPLANT
NDL SPNL 20GX3.5 QUINCKE YW (NEEDLE) IMPLANT
NEEDLE INSUFFLATION 14GA 120MM (NEEDLE) ×2 IMPLANT
NEEDLE SPNL 20GX3.5 QUINCKE YW (NEEDLE) ×2 IMPLANT
NS IRRIG 1000ML POUR BTL (IV SOLUTION) ×2 IMPLANT
PACK LAPAROSCOPY BASIN (CUSTOM PROCEDURE TRAY) ×2 IMPLANT
PAD OB MATERNITY 4.3X12.25 (PERSONAL CARE ITEMS) ×2 IMPLANT
PROTECTOR NERVE ULNAR (MISCELLANEOUS) ×2 IMPLANT
SUT MNCRL AB 3-0 PS2 27 (SUTURE) ×2 IMPLANT
SUT VICRYL 0 UR6 27IN ABS (SUTURE) ×2 IMPLANT
SYR CONTROL 10ML LL (SYRINGE) ×1 IMPLANT
TOWEL OR 17X24 6PK STRL BLUE (TOWEL DISPOSABLE) ×4 IMPLANT
TROCAR XCEL NON-BLD 11X100MML (ENDOMECHANICALS) ×2 IMPLANT
TROCAR XCEL NON-BLD 5MMX100MML (ENDOMECHANICALS) ×3 IMPLANT
TUBING HYDROFLEX HYSTEROSCOPY (TUBING) ×1 IMPLANT
WARMER LAPAROSCOPE (MISCELLANEOUS) ×2 IMPLANT
WATER STERILE IRR 1000ML POUR (IV SOLUTION) ×2 IMPLANT

## 2012-10-04 NOTE — Anesthesia Postprocedure Evaluation (Signed)
  Anesthesia Post-op Note  Patient: Pamela Novak  Procedure(s) Performed: Procedure(s) (LRB) with comments: LAPAROSCOPY OPERATIVE (N/A) DILATATION & CURETTAGE/HYSTEROSCOPY WITH NOVASURE ABLATION (N/A) CONDYLOMA REMOVAL (N/A) ABLATION ON ENDOMETRIOSIS (N/A)

## 2012-10-04 NOTE — Op Note (Signed)
Pamela Novak 10/04/2012  PREOPERATIVE DIAGNOSIS:  Menorrhagia, dysmenorrhea, vulvar condyloma  POSTOPERATIVE DIAGNOSIS:  SAA with endometriosis  PROCEDURE:  Diagnostic laparoscopy with coagulation of endometriosis, hysteroscopy with NovaSure, excision of vulvar condyloma  ANESTHESIA:  General endotracheal  SPECIMENS:  Peritoneal biopsies and vulvar condyloma  COMPLICATIONS:  None immediate.  ESTIMATED BLOOD LOSS:  Less than 20 ml.  INDICATIONS: 41 y.o.  with menorrhagia and dysmenorrhea refusing hormonal therapy as well as vulvar condyloma.     FINDINGS:  Normal uterus, normal bilateral fallopian tubes and ovaries.  Endometriosis on bilateral uterosacral ligaments left>right, vesicular endometriosis lesions on the peritoneum overlying the bladder and in bilateral utero-ovarian fossae.  Normal appendix and liver edge.  An 8 week sized uterus with diffuse proliferative endometrium.  Normal ostia bilaterally.  TECHNIQUE:  The patient was taken to the operating room where general anesthesia was obtained without difficulty.  She was then placed in the dorsal lithotomy position and prepared and draped in sterile fashion.  After an adequate timeout was performed, a bivalved speculum was then placed in the patient's vagina, and the anterior lip of cervix grasped with the single-tooth tenaculum.  The hulka clip was advanced into the uterus.  The speculum was removed from the vagina.  Attention was then turned to the patient's abdomen where a 10-mm skin incision was made on the umbilical fold.  The Veress needle was carefully introduced into the peritoneal cavity through the abdominal wall.  Intraperitoneal placement was confirmed by saline drop test.  Adequate pneumoperitoneum was obtained, and the 10/11 XL trocar was then advanced without difficulty into the abdomen where intraabdominal placement was confirmed by the operative laparoscope.  Survey of the underlying anatomy revealed no trauma on entry  and the above listed findings.  After local infiltration with marcaine, a 5 mm suprapubic incision was made and a 5 mm trocar advanced under direct visualization. Using biopsy forceps, peritoneal biopsies were taken from the posterior cul-de-sac and sent to pathology.  Using monopolar shears, the endometriosis lesions were coagulated being mindful of underlying anatomy.  Bilateral ureters were seen peristalsing after coagulation in the utero-ovarian fossae.  Instruments and trocars were removed under direct visualization.  The infraumbilical fascial incision was closed with 2-0 vicryl in a figure-of-eight stitch.  The skin was closed with 3-0 monocryl in a subcuticular fashion.  Dermabond was applied to the skin.    Attention was then turned to the vaginal portion of the case.  The uterine manipulator was removed and the uterus sounded to 8 cm.  The cervix was dilated using Pratt dilators to accommodate the 5.43mm diagnostic hysteroscope.  The hysteroscope was inserted under direct visualization with the above listed findings.  The cervical length was noted to be 3.5 cm with a cavity length of 4.5 cm.  The NovaSure device was opened and calibrated per the uterine measurements.  The NovaSure was inserted into the uterus and seated per manufacturer's recommendation.  The cavity width was 3.5 cm.  The cycle was performed at power of 87 for length of 62 seconds.  The device was removed and a final hysteroscopic look revealed diffuse blanching of the endometrium.  The tenaculum was removed from the anterior lip of the cervix and hemostasis was achieved with pressure.   Finally, attention was turned to the posterior forchette where there were two lesions on each side measuring approximately 3mm each.  Using the Bovie knife, the lesions were removed and sent to path.  The areas were covered in Silvadene cream.  The patient tolerated the procedure well.  Sponge, lap, and needle counts were correct times two.  The  patient was then taken to the recovery room awake, extubated and in stable  in stable condition.

## 2012-10-04 NOTE — Progress Notes (Signed)
No change in medical hx.  All questions answered.    Mitchel Honour, DO

## 2012-10-04 NOTE — Transfer of Care (Signed)
Immediate Anesthesia Transfer of Care Note  Patient: Pamela Novak  Procedure(s) Performed: Procedure(s) (LRB) with comments: LAPAROSCOPY OPERATIVE (N/A) DILATATION & CURETTAGE/HYSTEROSCOPY WITH NOVASURE ABLATION (N/A) CONDYLOMA REMOVAL (N/A) ABLATION ON ENDOMETRIOSIS (N/A)  Patient Location: PACU  Anesthesia Type:General  Level of Consciousness: awake, alert  and oriented  Airway & Oxygen Therapy: Patient Spontanous Breathing and Patient connected to nasal cannula oxygen  Post-op Assessment: Report given to PACU RN and Post -op Vital signs reviewed and stable  Post vital signs: stable  Complications: No apparent anesthesia complications

## 2012-10-04 NOTE — Anesthesia Preprocedure Evaluation (Signed)
Anesthesia Evaluation  Patient identified by MRN, date of birth, ID band Patient awake    Reviewed: Allergy & Precautions, H&P , Patient's Chart, lab work & pertinent test results, reviewed documented beta blocker date and time   History of Anesthesia Complications (+) AWARENESS UNDER ANESTHESIA  Airway Mallampati: II TM Distance: >3 FB Neck ROM: full    Dental No notable dental hx.    Pulmonary  breath sounds clear to auscultation  Pulmonary exam normal       Cardiovascular Rhythm:regular Rate:Normal     Neuro/Psych PSYCHIATRIC DISORDERS    GI/Hepatic   Endo/Other    Renal/GU      Musculoskeletal   Abdominal   Peds  Hematology   Anesthesia Other Findings   Reproductive/Obstetrics                           Anesthesia Physical Anesthesia Plan  ASA: II  Anesthesia Plan: General   Post-op Pain Management:    Induction: Intravenous  Airway Management Planned: Oral ETT  Additional Equipment:   Intra-op Plan:   Post-operative Plan:   Informed Consent: I have reviewed the patients History and Physical, chart, labs and discussed the procedure including the risks, benefits and alternatives for the proposed anesthesia with the patient or authorized representative who has indicated his/her understanding and acceptance.   Dental Advisory Given and Dental advisory given  Plan Discussed with: CRNA and Surgeon  Anesthesia Plan Comments: (  Discussed  general anesthesia, including possible nausea, instrumentation of airway, sore throat,pulmonary aspiration, etc. I asked if the were any outstanding questions, or  concerns before we proceeded. )        Anesthesia Quick Evaluation

## 2012-10-05 ENCOUNTER — Encounter (HOSPITAL_COMMUNITY): Payer: Self-pay | Admitting: Obstetrics & Gynecology

## 2012-10-09 ENCOUNTER — Encounter (INDEPENDENT_AMBULATORY_CARE_PROVIDER_SITE_OTHER): Payer: Self-pay | Admitting: General Surgery

## 2012-10-09 ENCOUNTER — Ambulatory Visit (INDEPENDENT_AMBULATORY_CARE_PROVIDER_SITE_OTHER): Payer: Federal, State, Local not specified - PPO | Admitting: General Surgery

## 2012-10-09 VITALS — BP 134/84 | HR 89 | Temp 98.8°F | Ht 67.0 in | Wt 163.6 lb

## 2012-10-09 DIAGNOSIS — K648 Other hemorrhoids: Secondary | ICD-10-CM

## 2012-10-09 MED ORDER — HYDROCORTISONE ACE-PRAMOXINE 1-1 % RE FOAM: 1.0000 | Freq: Two times a day (BID) | RECTAL | Status: DC

## 2012-10-09 NOTE — Patient Instructions (Signed)
Use miralax daily for constipation. May titrate dose to effect

## 2012-10-17 NOTE — Progress Notes (Signed)
Subjective:     Patient ID: Pamela Novak, female   DOB: 01-04-1971, 41 y.o.   MRN: [id number]  HPI The patient is a 41 year old white female who recently saw Dr. Gerrit Friends in our practice. At that time she was noted to have significant internal hemorrhoids. She reports that her problems started when she fell on her left breast and developed a large hematoma. She required narcotic pain medicine to control the pain from her fall. Because of this she has had significant problems with constipation. She has had some rectal discomfort but denies any blood in her bowel movements.  Review of Systems  Constitutional: Negative.   HENT: Negative.   Eyes: Negative.   Respiratory: Negative.   Cardiovascular: Negative.   Gastrointestinal: Positive for constipation and rectal pain.  Genitourinary: Negative.   Musculoskeletal: Negative.   Skin: Negative.   Neurological: Negative.   Hematological: Negative.   Psychiatric/Behavioral: Negative.        Objective:   Physical Exam  Constitutional: She is oriented to person, place, and time. She appears well-developed and well-nourished.  HENT:  Head: Normocephalic and atraumatic.  Eyes: Conjunctivae normal and EOM are normal. Pupils are equal, round, and reactive to light.  Neck: Normal range of motion. Neck supple.  Cardiovascular: Normal rate, regular rhythm and normal heart sounds.   Pulmonary/Chest: Effort normal and breath sounds normal.  Abdominal: Soft. Bowel sounds are normal.  Genitourinary:       Her external perirectal scan shows 2 small soft hemorrhoidal skin tags. On digital exam she has no palpable mass and good rectal tone. On anoscopic exam she has some mild to moderate internal hemorrhoidal tissue but nothing that appears large enough to prolapse today. There is no bleeding.  Musculoskeletal: Normal range of motion.  Neurological: She is alert and oriented to person, place, and time.  Skin: Skin is warm and dry.  Psychiatric: She has a  normal mood and affect. Her behavior is normal.       Assessment:     The patient has had recent problems with internal hemorrhoids and constipation secondary to narcotic use from a fall. Her internal hemorrhoids did not look that bad today. I suspect if she can get off the narcotics and get her bowel movements regulated better she may not continue to have issues with internal hemorrhoids.    Plan:     I recommended that she continue to use MiraLAX on a daily basis and try to titrate the amounts of that her stools are soft. We will plan to see her back in the next couple months to evaluate her progress

## 2012-11-02 ENCOUNTER — Encounter: Payer: Self-pay | Admitting: Internal Medicine

## 2012-11-02 ENCOUNTER — Ambulatory Visit (INDEPENDENT_AMBULATORY_CARE_PROVIDER_SITE_OTHER): Payer: Federal, State, Local not specified - PPO | Admitting: Internal Medicine

## 2012-11-02 VITALS — BP 138/80 | HR 91 | Temp 98.1°F | Ht 67.0 in | Wt 157.8 lb

## 2012-11-02 DIAGNOSIS — F411 Generalized anxiety disorder: Secondary | ICD-10-CM

## 2012-11-02 MED ORDER — FLUOXETINE HCL 20 MG PO TABS: 20.0000 mg | ORAL_TABLET | Freq: Every day | ORAL | Status: DC

## 2012-11-02 NOTE — Patient Instructions (Signed)

## 2012-11-02 NOTE — Progress Notes (Signed)
Subjective:    Patient ID: Pamela Novak, female    DOB: May 26, 1971, 42 y.o.   MRN: [id number]  HPI  Pt presents to the clinic today to f/u on medication changes. At the last visit, we took her off of Celexa and placed her Prozac for increase in anxiety. She has tolerated this very well and feels like her anxiety is under much better control. She has even taken less of her Xanax than she was previously take on the Celexa. She is very happy with the medication and not experiencing any unwanted side effects.  Review of Systems      Past Medical History  Diagnosis Date  . Anxiety   . Traumatic hematoma of knee   . Breast hematoma   . Complication of anesthesia     "woke up during colonoscopy"  . Depression   . Allergy   . Hyperlipidemia   . GERD (gastroesophageal reflux disease)   . Blood in stool     Current Outpatient Prescriptions  Medication Sig Dispense Refill  . ALPRAZolam (XANAX) 0.25 MG tablet Take 0.25 mg by mouth 3 (three) times daily as needed.      [name] Kitchen augmented betamethasone dipropionate (DIPROLENE) 0.05 % ointment Apply topically 2 (two) times daily.  30 g  0  . cefdinir (OMNICEF) 300 MG capsule Take 300 mg by mouth 2 (two) times daily.       [name] Kitchen FLUoxetine (PROZAC) 20 MG tablet Take 1 tablet (20 mg total) by mouth daily.  90 tablet  3  . hydrocortisone-pramoxine (PROCTOFOAM HC) rectal foam Place 1 applicator rectally 2 (two) times daily.  10 g  3  . montelukast (SINGULAIR) 10 MG tablet Take 10 mg by mouth daily.      [name] Kitchen oxyCODONE-acetaminophen (PERCOCET) 10-325 MG per tablet Take 1 tablet by mouth every 6 (six) hours as needed.       . polyethylene glycol (MIRALAX / GLYCOLAX) packet Take 17 g by mouth daily.      . Probiotic Product (PROBIOTIC DAILY PO) Take 1 capsule by mouth daily.      . traMADol (ULTRAM) 50 MG tablet Take 100 mg by mouth every 6 (six) hours as needed.         Allergies  Allergen Reactions  . Flagyl (Metronidazole) Rash    Family History    Problem Relation Age of Onset  . Heart disease Mother   . Arthritis Mother   . Hyperlipidemia Mother   . Hypertension Mother   . Cancer Father     Colon Cancer-Polyps  . Hypertension Father   . Hashimoto's thyroiditis Sister   . Diabetes Sister   . Cancer Paternal Grandfather     Colon Cancer  . Hashimoto's thyroiditis Sister     History   Social History  . Marital Status: Married    Spouse Name: N/A    Number of Children: 2  . Years of Education: 12+   Occupational History  . adm officer    Social History Main Topics  . Smoking status: Never Smoker   . Smokeless tobacco: Never Used  . Alcohol Use: No  . Drug Use: No  . Sexually Active: Yes   Other Topics Concern  . Not on file   Social History Narrative   Regular exercise-noCaffeine Use-yes     Constitutional: Denies fever, malaise, fatigue, headache or abrupt weight changes.   Cardiovascular: Denies chest pain, chest tightness, palpitations or swelling in the hands or feet.  Neurological: Denies dizziness,  difficulty with memory, difficulty with speech or problems with balance and coordination.  Psych: Reports decrease in anxiety. Denies depressive symptoms, SI/HI.  No other specific complaints in a complete review of systems (except as listed in HPI above).  Objective:   Physical Exam   BP 138/80  Pulse 91  Temp 98.1 F (36.7 C) (Oral)  Ht 5\' 7"  (1.702 m)  Wt 157 lb 12 oz (71.555 kg)  BMI 24.71 kg/m2  SpO2 97% Wt Readings from Last 3 Encounters:  11/02/12 157 lb 12 oz (71.555 kg)  10/09/12 163 lb 9.6 oz (74.208 kg)  09/24/12 162 lb (73.483 kg)    General: Appears her stated age, well developed, well nourished in NAD. Cardiovascular: Normal rate and rhythm. S1,S2 noted.  No murmur, rubs or gallops noted. No JVD or BLE edema. No carotid bruits noted. Pulmonary/Chest: Normal effort and positive vesicular breath sounds. No respiratory distress. No wheezes, rales or ronchi noted.   Neurological:  Alert and oriented. Cranial nerves II-XII intact. Coordination normal. +DTRs bilaterally. Psychiatric: Mood and affect normal. Behavior is normal. Judgment and thought content normal.        Assessment & Plan:   Generalized anxiety disorder, chronic, no additional workup required:  Prozac effective, will maintain current dose at this time Continue relaxation techniques and use of distraction  RTC as needed

## 2012-11-22 ENCOUNTER — Encounter (INDEPENDENT_AMBULATORY_CARE_PROVIDER_SITE_OTHER): Payer: Self-pay

## 2012-12-12 ENCOUNTER — Encounter (INDEPENDENT_AMBULATORY_CARE_PROVIDER_SITE_OTHER): Payer: Federal, State, Local not specified - PPO | Admitting: General Surgery

## 2013-02-21 ENCOUNTER — Telehealth: Payer: Self-pay | Admitting: Internal Medicine

## 2013-02-21 NOTE — Telephone Encounter (Signed)
Needs a refill on Xanax .25mg  (a new RX) to go to [facility].

## 2013-02-21 NOTE — Telephone Encounter (Signed)
Ash, Ok to refill. # 60, no refills [address]

## 2013-02-22 ENCOUNTER — Telehealth: Payer: Self-pay

## 2013-02-22 MED ORDER — ALPRAZOLAM 0.25 MG PO TABS: 0.2500 mg | ORAL_TABLET | Freq: Three times a day (TID) | ORAL | Status: DC | PRN

## 2013-02-22 NOTE — Telephone Encounter (Signed)
What are the directions on the xanax #60 BID or TID? Please advise. Call to [facility] on [address] [phone]

## 2013-02-22 NOTE — Telephone Encounter (Signed)
TID but I still only want to give her # 60 Thx

## 2013-02-22 NOTE — Telephone Encounter (Signed)
Notified pt rx call into pharmacy.../lmb 

## 2013-02-22 NOTE — Telephone Encounter (Signed)
Pharmacy notified TID instructions #60 only

## 2013-03-13 ENCOUNTER — Ambulatory Visit (INDEPENDENT_AMBULATORY_CARE_PROVIDER_SITE_OTHER): Payer: 59 | Admitting: Internal Medicine

## 2013-03-13 ENCOUNTER — Encounter: Payer: Self-pay | Admitting: Internal Medicine

## 2013-03-13 VITALS — BP 110/80 | HR 101 | Temp 99.9°F | Ht 67.0 in | Wt 157.1 lb

## 2013-03-13 DIAGNOSIS — R21 Rash and other nonspecific skin eruption: Secondary | ICD-10-CM

## 2013-03-13 MED ORDER — NYSTATIN 100000 UNIT/ML MT SUSP: 500000.0000 [IU] | Freq: Four times a day (QID) | OROMUCOSAL | Status: DC

## 2013-03-13 MED ORDER — TRIAMCINOLONE ACETONIDE 0.1 % MT PSTE: PASTE | Freq: Two times a day (BID) | OROMUCOSAL | Status: DC

## 2013-03-13 MED ORDER — TRIAMCINOLONE ACETONIDE 0.1 % MT PSTE: PASTE | OROMUCOSAL | Status: DC

## 2013-03-13 NOTE — Assessment & Plan Note (Signed)
No skin or reported genital rash, but suspect possible lichen planus to buccal mucosa, as well as possible tongue involvement (vs thrush); for kenalog in orabase for buccal mucosa bid, as well as trial nystatin soln oral, watch for any other skin or mucosal involvement or other rheum dz,  to f/u any worsening symptoms or concerns

## 2013-03-13 NOTE — Progress Notes (Signed)
Subjective:    Patient ID: Pamela Novak, female    DOB: Mar 16, 1971, 42 y.o.   MRN: [id number]  HPI  Here with several wks whitish type of rash to both inner cheeks, as well as tongue whitish, small painful ulcers, and part of whitish left inner cheek rash seemed to have "sloughed off" since this AM.  No prior hx, no genital or other skin rash.  No hx of immunosuppressed, HIV, denies risk factors Past Medical History  Diagnosis Date  . Anxiety   . Traumatic hematoma of knee   . Breast hematoma   . Complication of anesthesia     "woke up during colonoscopy"  . Depression   . Allergy   . Hyperlipidemia   . GERD (gastroesophageal reflux disease)   . Blood in stool    Past Surgical History  Procedure Laterality Date  . Colonoscopy    . Laparoscopy  10/04/2012    Procedure: LAPAROSCOPY OPERATIVE;  Surgeon: Mitchel Honour, DO;  Location: [facility];  Service: Gynecology;  Laterality: N/A;  . Dilitation & currettage/hystroscopy with novasure ablation  10/04/2012    Procedure: DILATATION & CURETTAGE/HYSTEROSCOPY WITH NOVASURE ABLATION;  Surgeon: Mitchel Honour, DO;  Location: [facility];  Service: Gynecology;  Laterality: N/A;  . Condyloma excision/fulguration  10/04/2012    Procedure: CONDYLOMA REMOVAL;  Surgeon: Mitchel Honour, DO;  Location: [facility];  Service: Gynecology;  Laterality: N/A;  . Ablation on endometriosis  10/04/2012    Procedure: ABLATION ON ENDOMETRIOSIS;  Surgeon: Mitchel Honour, DO;  Location: [facility];  Service: Gynecology;  Laterality: N/A;    reports that she has never smoked. She has never used smokeless tobacco. She reports that she does not drink alcohol or use illicit drugs. family history includes Arthritis in her mother; Cancer in her father and paternal grandfather; Diabetes in her sister; Hashimoto's thyroiditis in her sisters; Heart disease in her mother; Hyperlipidemia in her mother; and Hypertension in her father and mother. Allergies  Allergen Reactions  . Flagyl  (Metronidazole) Rash   Current Outpatient Prescriptions on File Prior to Visit  Medication Sig Dispense Refill  . ALPRAZolam (XANAX) 0.25 MG tablet Take 1 tablet (0.25 mg total) by mouth 3 (three) times daily as needed.  60 tablet  0  . augmented betamethasone dipropionate (DIPROLENE) 0.05 % ointment Apply topically 2 (two) times daily.  30 g  0  . FLUoxetine (PROZAC) 20 MG tablet Take 1 tablet (20 mg total) by mouth daily.  90 tablet  3  . hydrocortisone-pramoxine (PROCTOFOAM HC) rectal foam Place 1 applicator rectally 2 (two) times daily.  10 g  3  . montelukast (SINGULAIR) 10 MG tablet Take 10 mg by mouth daily.      [name] Kitchen oxyCODONE-acetaminophen (PERCOCET) 10-325 MG per tablet Take 1 tablet by mouth every 6 (six) hours as needed.       . polyethylene glycol (MIRALAX / GLYCOLAX) packet Take 17 g by mouth daily.      . Probiotic Product (PROBIOTIC DAILY PO) Take 1 capsule by mouth daily.      . traMADol (ULTRAM) 50 MG tablet Take 100 mg by mouth every 6 (six) hours as needed.        No current facility-administered medications on file prior to visit.   Review of Systems .All otherwise neg per pt     Objective:   Physical Exam BP 110/80  Pulse 101  Temp(Src) 99.9 F (37.7 C) (Oral)  Ht 5\' 7"  (1.702 m)  Wt 157 lb 2  oz (71.271 kg)  BMI 24.6 kg/m2  SpO2 98% VS noted,  Constitutional: Pt appears well-developed and well-nourished.  HENT: Head: NCAT.  Right Ear: External ear normal.  Left Ear: External ear normal.  Eyes: Conjunctivae and EOM are normal. Pupils are equal, round, and reactive to light.  Neck: Normal range of motion. Neck supple.  Mouth: whitish reticular linear rash to bilat bucca mucosa, tongue with rash - ? thrush Cardiovascular: Normal rate and regular rhythm.   Pulmonary/Chest: Effort normal and breath sounds normal.  Neurological: Pt is alert. Not confused  Skin  - no rash Psychiatric: Pt behavior is normal. 1-2+ nervous    Assessment & Plan:

## 2013-03-13 NOTE — Patient Instructions (Signed)
It appears you may have "lichen planus" to the inside cheek areas on both sides You may also have thrush to the tongue, though this can be involved with lichen planus as well Please take all new medication as prescribed - the nystatin solution (to swish and spit 4 times per day) and the Kenalog in Orabase to help with the rashes to the cheeks Please continue all other medications as before, and refills have been done if requested. Please keep your appointments with your specialists as you have planned Please consider seeing an oral surgeon if not improving

## 2013-05-21 ENCOUNTER — Ambulatory Visit (INDEPENDENT_AMBULATORY_CARE_PROVIDER_SITE_OTHER): Payer: 59 | Admitting: Internal Medicine

## 2013-05-21 ENCOUNTER — Encounter: Payer: Self-pay | Admitting: Internal Medicine

## 2013-05-21 VITALS — BP 126/80 | HR 82 | Temp 98.6°F | Wt 162.0 lb

## 2013-05-21 DIAGNOSIS — F32A Depression, unspecified: Secondary | ICD-10-CM

## 2013-05-21 DIAGNOSIS — L989 Disorder of the skin and subcutaneous tissue, unspecified: Secondary | ICD-10-CM | POA: Insufficient documentation

## 2013-05-21 DIAGNOSIS — I8390 Asymptomatic varicose veins of unspecified lower extremity: Secondary | ICD-10-CM | POA: Insufficient documentation

## 2013-05-21 DIAGNOSIS — D1739 Benign lipomatous neoplasm of skin and subcutaneous tissue of other sites: Secondary | ICD-10-CM

## 2013-05-21 DIAGNOSIS — D170 Benign lipomatous neoplasm of skin and subcutaneous tissue of head, face and neck: Secondary | ICD-10-CM

## 2013-05-21 DIAGNOSIS — F341 Dysthymic disorder: Secondary | ICD-10-CM

## 2013-05-21 DIAGNOSIS — F419 Anxiety disorder, unspecified: Secondary | ICD-10-CM

## 2013-05-21 MED ORDER — BUPROPION HCL ER (XL) 150 MG PO TB24: 150.0000 mg | ORAL_TABLET | Freq: Two times a day (BID) | ORAL | Status: DC

## 2013-05-21 NOTE — Assessment & Plan Note (Signed)
Reassurance given Continue to monitor 

## 2013-05-21 NOTE — Assessment & Plan Note (Signed)
Will refer to vein specialist 

## 2013-05-21 NOTE — Assessment & Plan Note (Signed)
Info given for Dr. Terri Piedra dermatology

## 2013-05-21 NOTE — Patient Instructions (Signed)

## 2013-05-21 NOTE — Progress Notes (Signed)
Subjective:    Patient ID: Pamela Novak, female    DOB: [birth year], 42 y.o.   MRN: [id number]  HPI  Pt presents to the clinic today with c/o a lump in her leg. She noticed this many years ago. She was told that it is a lipoma. She does have lumps on both legs but feels like the lump on her right leg has grown in size. It is not painful. It is soft. Additionally, she c/o depression. She has had a history of anxiety and depression in the past. She was on Celexa and Xanax, but did not like the side effects from the Celexa. She was subsequently switched to Prozac and her anxiety was well controlled. She was not supplementing with Xanax nearly as often. She now feels like the Prozac in not controlling her anxiety as well. She would like to go back on her wellbutrin that she has been on in the past. She also c/o varicose veins on both legs. Sometimes they are tender. Sometimes they itch. She would like to know what could be done about them. She has never had this issue evaluated before. She also c/o multiple skin lesions. She would like a referral to a dermatologist for further evaluation.  Review of Systems      Past Medical History  Diagnosis Date  . Anxiety   . Traumatic hematoma of knee   . Breast hematoma   . Complication of anesthesia     "woke up during colonoscopy"  . Depression   . Allergy   . Hyperlipidemia   . GERD (gastroesophageal reflux disease)   . Blood in stool     Current Outpatient Prescriptions  Medication Sig Dispense Refill  . ALPRAZolam (XANAX) 0.25 MG tablet Take 1 tablet (0.25 mg total) by mouth 3 (three) times daily as needed.  60 tablet  0  . augmented betamethasone dipropionate (DIPROLENE) 0.05 % ointment Apply topically 2 (two) times daily.  30 g  0  . FLUoxetine (PROZAC) 20 MG tablet Take 1 tablet (20 mg total) by mouth daily.  90 tablet  3  . hydrocortisone-pramoxine (PROCTOFOAM HC) rectal foam Place 1 applicator rectally 2 (two) times daily.  10 g  3  .  montelukast (SINGULAIR) 10 MG tablet Take 10 mg by mouth daily.      [name] Kitchen nystatin (MYCOSTATIN) 100000 UNIT/ML suspension Take 5 mLs (500,000 Units total) by mouth 4 (four) times daily.  60 mL  0  . oxyCODONE-acetaminophen (PERCOCET) 10-325 MG per tablet Take 1 tablet by mouth every 6 (six) hours as needed.       . polyethylene glycol (MIRALAX / GLYCOLAX) packet Take 17 g by mouth daily.      . Probiotic Product (PROBIOTIC DAILY PO) Take 1 capsule by mouth daily.      . traMADol (ULTRAM) 50 MG tablet Take 100 mg by mouth every 6 (six) hours as needed.       . triamcinolone (KENALOG) 0.1 % paste Place onto affected area twice per day  5 g  12   No current facility-administered medications for this visit.    Allergies  Allergen Reactions  . Flagyl (Metronidazole) Rash    Family History  Problem Relation Age of Onset  . Heart disease Mother   . Arthritis Mother   . Hyperlipidemia Mother   . Hypertension Mother   . Cancer Father     Colon Cancer-Polyps  . Hypertension Father   . Hashimoto's thyroiditis Sister   . Diabetes Sister   .  Cancer Paternal Grandfather     Colon Cancer  . Hashimoto's thyroiditis Sister     History   Social History  . Marital Status: Married    Spouse Name: N/A    Number of Children: 2  . Years of Education: 12+   Occupational History  . adm officer    Social History Main Topics  . Smoking status: Never Smoker   . Smokeless tobacco: Never Used  . Alcohol Use: No  . Drug Use: No  . Sexually Active: Yes   Other Topics Concern  . Not on file   Social History Narrative   Regular exercise-no   Caffeine Use-yes     Constitutional: Denies fever, malaise, fatigue, headache or abrupt weight changes.  Respiratory: Denies difficulty breathing, shortness of breath, cough or sputum production.   Cardiovascular: Pt reports varicose veins. Denies chest pain, chest tightness, palpitations or swelling in the hands or feet.   Skin: Pt reports multiple  rough and discolored skin lesions. Denies redness, rashes, or ulcercations.  Psych: Pt reports anxiety and depression. Denies SI/HI.  No other specific complaints in a complete review of systems (except as listed in HPI above).  Objective:   Physical Exam   BP 126/80  Pulse 82  Temp(Src) 98.6 F (37 C) (Oral)  Wt 162 lb (73.483 kg)  BMI 25.37 kg/m2  SpO2 97% Wt Readings from Last 3 Encounters:  05/21/13 162 lb (73.483 kg)  03/13/13 157 lb 2 oz (71.271 kg)  11/02/12 157 lb 12 oz (71.555 kg)    General: Appears her stated age, well developed, well nourished in NAD. Skin: Warm, dry and intact. Lipoma noted on right upper thigh. Small scaly round skin lesion noted on right forehead. Cardiovascular: Normal rate and rhythm. S1,S2 noted.  No murmur, rubs or gallops noted. No JVD or BLE edema. No carotid bruits noted. Varicose veins noted on anterior thighs and bilateral calves. Pulmonary/Chest: Normal effort and positive vesicular breath sounds. No respiratory distress. No wheezes, rales or ronchi noted.  Psychiatric: Mood and affect normal. Behavior is normal. Judgment and thought content normal.   :  BMET No results found for this basename: na, k, cl, co2, glucose, bun, creatinine, calcium, gfrnonaa, gfraa    Lipid Panel  No results found for this basename: chol, trig, hdl, cholhdl, vldl, ldlcalc    CBC    Component Value Date/Time   WBC 6.5 10/01/2012 1205   RBC 4.74 10/01/2012 1205   HGB 13.7 10/01/2012 1205   HCT 40.5 10/01/2012 1205   PLT 265 10/01/2012 1205   MCV 85.4 10/01/2012 1205   MCH 28.9 10/01/2012 1205   MCHC 33.8 10/01/2012 1205   RDW 12.6 10/01/2012 1205    Hgb A1C No results found for this basename: HGBA1C        Assessment & Plan:

## 2013-05-21 NOTE — Assessment & Plan Note (Signed)
Pt has already stopped prozac eRx for wellbutrin Strongly encouraged CBT

## 2013-05-23 ENCOUNTER — Other Ambulatory Visit: Payer: Self-pay | Admitting: *Deleted

## 2013-05-23 DIAGNOSIS — I83893 Varicose veins of bilateral lower extremities with other complications: Secondary | ICD-10-CM

## 2013-07-17 ENCOUNTER — Encounter: Payer: Self-pay | Admitting: Vascular Surgery

## 2013-07-18 ENCOUNTER — Encounter (INDEPENDENT_AMBULATORY_CARE_PROVIDER_SITE_OTHER): Payer: 59 | Admitting: Vascular Surgery

## 2013-07-18 ENCOUNTER — Encounter: Payer: Self-pay | Admitting: Vascular Surgery

## 2013-07-18 ENCOUNTER — Ambulatory Visit (INDEPENDENT_AMBULATORY_CARE_PROVIDER_SITE_OTHER): Payer: 59 | Admitting: Vascular Surgery

## 2013-07-18 VITALS — BP 126/81 | HR 110 | Ht 67.0 in | Wt 162.1 lb

## 2013-07-18 DIAGNOSIS — M79609 Pain in unspecified limb: Secondary | ICD-10-CM

## 2013-07-18 DIAGNOSIS — I83893 Varicose veins of bilateral lower extremities with other complications: Secondary | ICD-10-CM | POA: Insufficient documentation

## 2013-07-18 NOTE — Progress Notes (Signed)
[facility] [place] [facility] HISTORY AND PHYSICAL   History of Present Illness:  Patient is a 42 y.o. year old female who presents for evaluation of bilateral leg pain and itching. The patient has been having symptoms for approximately one year. She occasionally has what she refers to as a "deep leg pain" this can be random in occurs with sitting or with ambulation. She has also noted a lump in the right thigh which has doubled in size. However she states the lump has been present since she was a teenager. She has also noted a lump in the left anterior thigh. Also over the past year the scan in both legs below the knee has become quite sensitive. When something touches her lower legs that gives her a sensation of displeasure.  She is not able to really describe exactly the sensation. She denies any prior history of DVT. She did have significant vaginal varicosities during pregnancy but these resolved. She does have a family history of varicose veins. She is currently also being treated for bursitis in her right knee. She does say that her legs feel better when she wears compression stockings.  Other medical problems include depression anxiety hyperlipidemia and reflux all of which are currently stable.  Past Medical History  Diagnosis Date  . Anxiety   . Traumatic hematoma of knee   . Breast hematoma   . Complication of anesthesia     "woke up during colonoscopy"  . Depression   . Allergy   . Hyperlipidemia   . GERD (gastroesophageal reflux disease)   . Blood in stool     Past Surgical History  Procedure Laterality Date  . Colonoscopy    . Laparoscopy  10/04/2012    Procedure: LAPAROSCOPY OPERATIVE;  Surgeon: Mitchel Honour, DO;  Location: [facility];  Service: Gynecology;  Laterality: N/A;  . Dilitation & currettage/hystroscopy with novasure ablation  10/04/2012    Procedure: DILATATION & CURETTAGE/HYSTEROSCOPY WITH NOVASURE ABLATION;  Surgeon: Mitchel Honour, DO;  Location: [facility];   Service: Gynecology;  Laterality: N/A;  . Condyloma excision/fulguration  10/04/2012    Procedure: CONDYLOMA REMOVAL;  Surgeon: Mitchel Honour, DO;  Location: [facility];  Service: Gynecology;  Laterality: N/A;  . Ablation on endometriosis  10/04/2012    Procedure: ABLATION ON ENDOMETRIOSIS;  Surgeon: Mitchel Honour, DO;  Location: [facility];  Service: Gynecology;  Laterality: N/A;     Social History History  Substance Use Topics  . Smoking status: Never Smoker   . Smokeless tobacco: Never Used  . Alcohol Use: Yes     Comment: rare    Family History Family History  Problem Relation Age of Onset  . Heart disease Mother   . Arthritis Mother   . Hyperlipidemia Mother   . Hypertension Mother   . Heart attack Mother   . Cancer Father     Colon Cancer-Polyps  . Hypertension Father   . Hashimoto's thyroiditis Sister   . Diabetes Sister   . Thyroid disease Sister   . Cancer Paternal Grandfather     Colon Cancer  . Hashimoto's thyroiditis Sister   . Thyroid disease Sister     Allergies  Allergies  Allergen Reactions  . Flagyl [Metronidazole] Rash     Current Outpatient Prescriptions  Medication Sig Dispense Refill  . ALPRAZolam (XANAX) 0.25 MG tablet Take 1 tablet (0.25 mg total) by mouth 3 (three) times daily as needed.  60 tablet  0  . buPROPion (WELLBUTRIN XL) 150 MG 24 hr  tablet Take 1 tablet (150 mg total) by mouth 2 (two) times daily.  30 tablet  1  . Etonogestrel-Ethinyl Estradiol (NUVARING VA) Place vaginally every 21 ( twenty-one) days.      . Loratadine (ALAVERT PO) Take 1 tablet by mouth as needed.      . montelukast (SINGULAIR) 10 MG tablet Take 10 mg by mouth daily.      . VOLTAREN 1 % GEL Apply 1 application topically 2 (two) times daily.      [name] Kitchen augmented betamethasone dipropionate (DIPROLENE) 0.05 % ointment Apply topically 2 (two) times daily.  30 g  0  . hydrocortisone-pramoxine (PROCTOFOAM HC) rectal foam Place 1 applicator rectally 2 (two) times daily.  10 g  3  .  nystatin (MYCOSTATIN) 100000 UNIT/ML suspension Take 5 mLs (500,000 Units total) by mouth 4 (four) times daily.  60 mL  0  . oxyCODONE-acetaminophen (PERCOCET) 10-325 MG per tablet Take 1 tablet by mouth every 6 (six) hours as needed.       . polyethylene glycol (MIRALAX / GLYCOLAX) packet Take 17 g by mouth daily.      . Probiotic Product (PROBIOTIC DAILY PO) Take 1 capsule by mouth daily.      . traMADol (ULTRAM) 50 MG tablet Take 100 mg by mouth every 6 (six) hours as needed.       . triamcinolone (KENALOG) 0.1 % paste Place onto affected area twice per day  5 g  12   No current facility-administered medications for this visit.    ROS:   General:  No weight loss, Fever, chills  HEENT: No recent headaches, no nasal bleeding, no visual changes, no sore throat  Neurologic: No dizziness, blackouts, seizures. No recent symptoms of stroke or mini- stroke. No recent episodes of slurred speech, or temporary blindness.  Cardiac: No recent episodes of chest pain/pressure, no shortness of breath at rest.  No shortness of breath with exertion.  Denies history of atrial fibrillation or irregular heartbeat  Vascular: No history of rest pain in feet.  No history of claudication.  No history of non-healing ulcer, No history of DVT   Pulmonary: No home oxygen, no productive cough, no hemoptysis,  No asthma or wheezing  Musculoskeletal:  [ ]  Arthritis, [ ]  Low back pain,  [ ]  Joint pain  Hematologic:No history of hypercoagulable state.  No history of easy bleeding.  No history of anemia  Gastrointestinal: No hematochezia or melena,  No gastroesophageal reflux, no trouble swallowing  Urinary: [ ]  chronic Kidney disease, [ ]  on HD - [ ]  MWF or [ ]  TTHS, [ ]  Burning with urination, [ ]  Frequent urination, [ ]  Difficulty urinating;   Skin: No rashes  Psychological: + history of anxiety,  + history of depression   Physical Examination  Filed Vitals:   07/18/13 1422  BP: 126/81  Pulse: 110   Height: 5\' 7"  (1.702 m)  Weight: 162 lb 1.6 oz (73.528 kg)  SpO2: 100%    Body mass index is 25.38 kg/(m^2).  General:  Alert and oriented, no acute distress HEENT: Normal Neck: No bruit or JVD Pulmonary: Clear to auscultation bilaterally Cardiac: Regular Rate and Rhythm without murmur Abdomen: Soft, non-tender, non-distended, no mass Skin: No rash, 2.5 cm subcutaneous nodule right upper third anterior thigh the nodule is not fixed it is nontender it is not fluctuant, similar size and character nodule distal third anterior thigh, area of slight ecchymosis right medial knee Extremity Pulses:  2+ radial, brachial, femoral,  dorsalis pedis, posterior tibial pulses bilaterally Musculoskeletal: No deformity or edema  Neurologic: Upper and lower extremity motor 5/5 and symmetric  DATA: Patient had bilateral venous reflux exam today. This showed no evidence of significant deep venous reflux although she did have some reflux in the common femoral vein it was overall mild. She also had one area of focal reflux in the right distal greater saphenous and left proximal lesser saphenous vein.  She had no evidence of DVT. Overall this was nearly a normal exam.   ASSESSMENT: Mild venous reflux bilaterally but most likely not clinically relevant to her current symptoms. I do not believe the long as her anterior thigh are varicosities and most likely represent a subcutaneous nodule perhaps intramuscular lipoma. However if the right anterior mass is truly enlarging in size is probably needs further evaluation with consideration for ultrasound to determine further what may be the etiology. The patient does not have reflux significant enough to consider laser ablation of her veins. She has no obvious physical varicosities on the skin to consider for stab avulsion. She has no significant spider-type or reticular varicosities for consideration of sclerotherapy. Her lower leg symptoms of itching/dysthesia is sound  like they could be possibly neuropathy but again I do not believe these are related to her veins. Her symptoms improved with compression stockings and I told her to continue this. Her arterial exam is normal.   PLAN:    No current intervention as her venous and arterial exam are essentially normal. Consideration for ultrasound of the nodules in her thighs if clinical suspicion is warranted by her primary care physician. She will followup with me on as-needed basis.  Fabienne Bruns, MD [facility] Office: [phone] Pager: [phone]

## 2013-09-05 ENCOUNTER — Other Ambulatory Visit: Payer: Self-pay

## 2013-11-26 ENCOUNTER — Other Ambulatory Visit: Payer: Self-pay | Admitting: Family Medicine

## 2013-11-26 DIAGNOSIS — F419 Anxiety disorder, unspecified: Principal | ICD-10-CM

## 2013-11-26 DIAGNOSIS — F329 Major depressive disorder, single episode, unspecified: Secondary | ICD-10-CM

## 2013-11-26 DIAGNOSIS — F32A Depression, unspecified: Secondary | ICD-10-CM

## 2013-11-26 MED ORDER — BUPROPION HCL ER (XL) 150 MG PO TB24: 150.0000 mg | ORAL_TABLET | Freq: Two times a day (BID) | ORAL | Status: DC

## 2013-11-26 NOTE — Telephone Encounter (Signed)
Requesting 90 day supply.  Last filled 04/2013?

## 2013-12-05 ENCOUNTER — Telehealth: Payer: Self-pay

## 2013-12-05 DIAGNOSIS — F419 Anxiety disorder, unspecified: Principal | ICD-10-CM

## 2013-12-05 DIAGNOSIS — F32A Depression, unspecified: Secondary | ICD-10-CM

## 2013-12-05 DIAGNOSIS — F329 Major depressive disorder, single episode, unspecified: Secondary | ICD-10-CM

## 2013-12-05 MED ORDER — BUPROPION HCL ER (XL) 150 MG PO TB24: 150.0000 mg | ORAL_TABLET | Freq: Two times a day (BID) | ORAL | Status: DC

## 2013-12-05 NOTE — Telephone Encounter (Signed)
Ok to send in to mail order #[id number], 1 refill

## 2013-12-05 NOTE — Telephone Encounter (Signed)
The patient called and is hoping to get her Wellbutrin switched over to her mail order pharmacy.  She states with her insurance, she has to use this pharmacy.   [place] toll free number - [phone]  (insurance company [employer])   She is hoping for 2 tabs per day, (she states she takes one in the am or pm ) 90 day supply

## 2013-12-05 NOTE — Telephone Encounter (Signed)
Rx sent through [facility] and l[facility] letting pt know [facility]

## 2013-12-05 NOTE — Addendum Note (Signed)
Addended by: Lurlean Nanny on: 12/05/2013 04:38 PM   Modules accepted: Orders

## 2014-03-18 ENCOUNTER — Encounter: Payer: Self-pay | Admitting: Internal Medicine

## 2014-03-18 ENCOUNTER — Ambulatory Visit (INDEPENDENT_AMBULATORY_CARE_PROVIDER_SITE_OTHER): Payer: 59 | Admitting: Internal Medicine

## 2014-03-18 VITALS — BP 112/72 | HR 84 | Temp 98.2°F | Ht 67.0 in | Wt 175.8 lb

## 2014-03-18 DIAGNOSIS — J029 Acute pharyngitis, unspecified: Secondary | ICD-10-CM | POA: Insufficient documentation

## 2014-03-18 MED ORDER — AZITHROMYCIN 250 MG PO TABS: ORAL_TABLET | ORAL | Status: DC

## 2014-03-18 NOTE — Progress Notes (Signed)
Pre visit review using our clinic review tool, if applicable. No additional management support is needed unless otherwise documented below in the visit note. 

## 2014-03-18 NOTE — Assessment & Plan Note (Signed)
Mild to mod, ? Infectious vs other such as apthous ulcer, for antibx course,  to f/u any worsening symptoms or concerns, consider ENT or rheum referral

## 2014-03-18 NOTE — Patient Instructions (Signed)
Please take all new medication as prescribed - the antibiotic  OK to use chloraseptic otc, or salt water gargles, and/or tylenol as needed for pain  If not improved soon, we may need to consider possible Apthous ulcer type lesion, and consider referral to ENT, or even rheumatology

## 2014-03-18 NOTE — Progress Notes (Signed)
Subjective:    Patient ID: Pamela Novak, female    DOB: 24-Mar-1971, 43 y.o.   MRN: [id number]  HPI  Here with acute onset 2 wks ST, mostly on the right with ? Some radiation to right ear vs ear pain otherwise, noted a spot to right post throat as well. No high fever, HA, sinus congestion, cough and Pt denies chest pain, increased sob or doe, wheezing, orthopnea, PND, increased LE swelling, palpitations, dizziness or syncope. No fever, but has been around ill people recently with [employer] college activities.  Past Medical History  Diagnosis Date  . Anxiety   . Traumatic hematoma of knee   . Breast hematoma   . Complication of anesthesia     "woke up during colonoscopy"  . Depression   . Allergy   . Hyperlipidemia   . GERD (gastroesophageal reflux disease)   . Blood in stool    Past Surgical History  Procedure Laterality Date  . Colonoscopy    . Laparoscopy  10/04/2012    Procedure: LAPAROSCOPY OPERATIVE;  Surgeon: Linda Hedges, DO;  Location: [facility];  Service: Gynecology;  Laterality: N/A;  . Dilitation & currettage/hystroscopy with novasure ablation  10/04/2012    Procedure: DILATATION & CURETTAGE/HYSTEROSCOPY WITH NOVASURE ABLATION;  Surgeon: Linda Hedges, DO;  Location: [facility];  Service: Gynecology;  Laterality: N/A;  . Condyloma excision/fulguration  10/04/2012    Procedure: CONDYLOMA REMOVAL;  Surgeon: Linda Hedges, DO;  Location: [facility];  Service: Gynecology;  Laterality: N/A;  . Ablation on endometriosis  10/04/2012    Procedure: ABLATION ON ENDOMETRIOSIS;  Surgeon: Linda Hedges, DO;  Location: [facility];  Service: Gynecology;  Laterality: N/A;    reports that she has never smoked. She has never used smokeless tobacco. She reports that she drinks alcohol. She reports that she does not use illicit drugs. family history includes Arthritis in her mother; Cancer in her father and paternal grandfather; Diabetes in her sister; Hashimoto's thyroiditis in her sister and sister; Heart  attack in her mother; Heart disease in her mother; Hyperlipidemia in her mother; Hypertension in her father and mother; Thyroid disease in her sister and sister. Allergies  Allergen Reactions  . Flagyl [Metronidazole] Rash   Current Outpatient Prescriptions on File Prior to Visit  Medication Sig Dispense Refill  . ALPRAZolam (XANAX) 0.25 MG tablet Take 1 tablet (0.25 mg total) by mouth 3 (three) times daily as needed.  60 tablet  0  . buPROPion (WELLBUTRIN XL) 150 MG 24 hr tablet Take 1 tablet (150 mg total) by mouth 2 (two) times daily.  180 tablet  1   No current facility-administered medications on file prior to visit.   Review of Systems All otherwise neg per pt     Objective:   Physical Exam BP 112/72  Pulse 84  Temp(Src) 98.2 F (36.8 C) (Oral)  Ht 5\' 7"  (1.702 m)  Wt 175 lb 12 oz (79.72 kg)  BMI 27.52 kg/m2  SpO2 95% VS noted,  Constitutional: Pt appears well-developed, well-nourished.  HENT: Head: NCAT.  Right Ear: External ear normal.  Left Ear: External ear normal.  Eyes: . Pupils are equal, round, and reactive to light. Conjunctivae and EOM are normal Bilat tm's with mild erythema left > right.  Max sinus areas non tender. Pharynx with diffuse post and tonsillar pillar erythema, mild swelling, with approx 5-6 mm shallow ulceration to right tonsillar pillar Neck: Normal range of motion. Neck supple. No LA Cardiovascular: Normal rate and regular rhythm.  Pulmonary/Chest: Effort normal and breath sounds normal.  Neurological: Pt is alert. Not confused , motor grossly intact Skin: Skin is warm. No rash Psychiatric: Pt behavior is normal. No agitation.     Assessment & Plan:

## 2014-08-15 ENCOUNTER — Other Ambulatory Visit: Payer: Self-pay

## 2014-10-03 ENCOUNTER — Encounter: Payer: Self-pay | Admitting: Internal Medicine

## 2014-10-03 ENCOUNTER — Other Ambulatory Visit: Payer: 59

## 2014-10-03 ENCOUNTER — Ambulatory Visit (INDEPENDENT_AMBULATORY_CARE_PROVIDER_SITE_OTHER): Payer: 59 | Admitting: Internal Medicine

## 2014-10-03 VITALS — BP 122/78 | HR 87 | Temp 98.7°F | Resp 12 | Ht 67.0 in | Wt 176.4 lb

## 2014-10-03 DIAGNOSIS — J029 Acute pharyngitis, unspecified: Secondary | ICD-10-CM

## 2014-10-03 DIAGNOSIS — K1370 Unspecified lesions of oral mucosa: Secondary | ICD-10-CM

## 2014-10-03 MED ORDER — SUCRALFATE 1 GM/10ML PO SUSP: 1.0000 g | Freq: Three times a day (TID) | ORAL | Status: DC

## 2014-10-03 MED ORDER — LIDOCAINE VISCOUS 2 % MT SOLN: 20.0000 mL | Freq: Four times a day (QID) | OROMUCOSAL | Status: DC | PRN

## 2014-10-03 NOTE — Assessment & Plan Note (Signed)
Unclear etiology, will check rheumatoid factor, AnA, Sjogren's markers, HIV. She can use viscous lidocaine for the pain in the back of her throat, Carafate to help coat her esophagus. Referral for GI as she likely does need endoscopy to make sure she is not having any additional etiology in her esophagus. No overt Candida in her mouth. No signs of bacterial infection and she has failed repeated courses of antibiotics for the same issue. If no resolution with GI referral may consider infectious disease versus rheumatology referral

## 2014-10-03 NOTE — Progress Notes (Signed)
   Subjective:    Patient ID: Pamela Novak, female    DOB: May 31, 1971, 43 y.o.   MRN: [id number]  HPI The patient is a 43 year old female comes in for an day for an acute visit for sore throat. She has been seen multiple times in the past for the same problem however never has had adequate resolution. She does have ulcers at the back of her throat which she was told was lichen planus. She is also having discomfort with swallowing. She has seen ENT for this problem who used a laryngoscope to look down at the back of her throat and did not see any abnormalities with her voice box. She was started recently on Prilosec for this problem and she is unclear if this is helped at all or not. She also has been having some vaginal dryness and discomfort and it's unclear if this is related however she's read about all the symptoms and feels that it also may be lichen planus.  Review of Systems  Constitutional: Negative.   HENT: Positive for mouth sores and sore throat. Negative for congestion, ear discharge, ear pain, facial swelling, postnasal drip, rhinorrhea and sinus pressure.   Respiratory: Negative.  Negative for cough.   Cardiovascular: Negative.   Gastrointestinal: Negative.       Objective:   Physical Exam  Constitutional: She appears well-developed and well-nourished.  HENT:  Head: Normocephalic and atraumatic.  Right Ear: External ear normal.  Left Ear: External ear normal.  Nose: Nose normal.  Oropharynx with redness and surrounding the tonsils and several ulcerations at the very back of throat. No overt oral ulcers around the gumline or teeth area. No Candida on the tongue or the back of the throat.  Eyes: EOM are normal. Pupils are equal, round, and reactive to light.  Neck: Normal range of motion. Neck supple. No JVD present. No thyromegaly present.  Cardiovascular: Normal rate and regular rhythm.   Pulmonary/Chest: Effort normal and breath sounds normal.  Abdominal: Soft. Bowel  sounds are normal.  Lymphadenopathy:    She has no cervical adenopathy.   Filed Vitals:   10/03/14 1414  BP: 122/78  Pulse: 87  Temp: 98.7 F (37.1 C)  TempSrc: Oral  Resp: 12  Height: 5\' 7"  (1.702 m)  Weight: 176 lb 6.4 oz (80.015 kg)  SpO2: 96%      Assessment & Plan:

## 2014-10-03 NOTE — Progress Notes (Signed)
Pre visit review using our clinic review tool, if applicable. No additional management support is needed unless otherwise documented below in the visit note. 

## 2014-10-03 NOTE — Patient Instructions (Signed)
We're going to check some blood work for you today. We will call you back with the results which likely will be Monday or Tuesday. We will also send her to the GI doctor as we think he may need an endoscopy where they look down urethra with a camera.  We had given you to prescriptions today, one is for viscous lidocaine which you can swirl around her mouth and then swallow to help with the irritation and pain. The other is for Carafate which she will swallow which will help to coat your esophagus and give you less pain.

## 2014-10-04 LAB — RHEUMATOID FACTOR: Rhuematoid fact SerPl-aCnc: <10 IU/mL (ref <=14)

## 2014-10-04 LAB — HIV ANTIBODY (ROUTINE TESTING W REFLEX): HIV 1&2 Ab, 4th Generation: NONREACTIVE

## 2014-10-06 LAB — SJOGRENS SYNDROME-A EXTRACTABLE NUCLEAR ANTIBODY: SSA (Ro) (ENA) Antibody, IgG: <1

## 2014-10-06 LAB — ANA: Anti Nuclear Antibody(ANA): NEGATIVE

## 2014-10-06 LAB — SJOGRENS SYNDROME-B EXTRACTABLE NUCLEAR ANTIBODY: SSB (La) (ENA) Antibody, IgG: <1

## 2014-10-07 ENCOUNTER — Other Ambulatory Visit: Payer: Self-pay

## 2014-10-07 ENCOUNTER — Other Ambulatory Visit: Payer: Self-pay | Admitting: Obstetrics & Gynecology

## 2014-10-07 DIAGNOSIS — N6459 Other signs and symptoms in breast: Secondary | ICD-10-CM

## 2014-10-07 DIAGNOSIS — N63 Unspecified lump in unspecified breast: Secondary | ICD-10-CM

## 2014-10-08 ENCOUNTER — Ambulatory Visit (INDEPENDENT_AMBULATORY_CARE_PROVIDER_SITE_OTHER): Payer: 59 | Admitting: Internal Medicine

## 2014-10-08 ENCOUNTER — Encounter: Payer: Self-pay | Admitting: Internal Medicine

## 2014-10-08 VITALS — BP 122/88 | HR 89 | Temp 98.3°F | Ht 67.0 in | Wt 176.0 lb

## 2014-10-08 DIAGNOSIS — J0391 Acute recurrent tonsillitis, unspecified: Secondary | ICD-10-CM

## 2014-10-08 MED ORDER — LEVOFLOXACIN 250 MG PO TABS
250.0000 mg | ORAL_TABLET | Freq: Every day | ORAL | Status: DC
Start: 2014-10-08 — End: 2015-11-11

## 2014-10-08 NOTE — Progress Notes (Signed)
Pre visit review using our clinic review tool, if applicable. No additional management support is needed unless otherwise documented below in the visit note. 

## 2014-10-08 NOTE — Patient Instructions (Signed)
Please take all new medication as prescribed - the antibiotic  Please continue all other medications as before, and refills have been done if requested.  Please have the pharmacy call with any other refills you may need.  Please keep your appointments with your specialists as you may have planned  You will be contacted regarding the referral for: ENT   

## 2014-10-08 NOTE — Progress Notes (Signed)
Subjective:    Patient ID: Pamela Novak, female    DOB: Sep 02, 1971, 43 y.o.   MRN: [id number]  HPI  Here with c/o persistent ST, accompanied by visulized enlarged tonsilar tissue with crypts and stones evolving since seen last per Dr Doug Sou.  Mult rheum labs neg.  In retrospect, has had several episodes similar in the past yr, each lasting up to 4 -6 wks, assoc with fatigue.  Also with high stress job, cant afford to be recurrently ill.  Not pregnant.  No high fever but feels warm.  No dysphagia or sob/breathing difficulty, no aspiration problems it seems.  Xylocaine does help temporarily, also has small nonprod cough occas. Pt denies chest pain, increased sob or doe, wheezing, orthopnea, PND, increased LE swelling, palpitations, dizziness or syncope. Denies worsening depressive symptoms, suicidal ideation, or panic; has ongoing anxiety, Past Medical History  Diagnosis Date  . Anxiety   . Traumatic hematoma of knee   . Breast hematoma   . Complication of anesthesia     "woke up during colonoscopy"  . Depression   . Allergy   . Hyperlipidemia   . GERD (gastroesophageal reflux disease)   . Blood in stool    Past Surgical History  Procedure Laterality Date  . Colonoscopy    . Laparoscopy  10/04/2012    Procedure: LAPAROSCOPY OPERATIVE;  Surgeon: Linda Hedges, DO;  Location: [facility];  Service: Gynecology;  Laterality: N/A;  . Dilitation & currettage/hystroscopy with novasure ablation  10/04/2012    Procedure: DILATATION & CURETTAGE/HYSTEROSCOPY WITH NOVASURE ABLATION;  Surgeon: Linda Hedges, DO;  Location: [facility];  Service: Gynecology;  Laterality: N/A;  . Condyloma excision/fulguration  10/04/2012    Procedure: CONDYLOMA REMOVAL;  Surgeon: Linda Hedges, DO;  Location: [facility];  Service: Gynecology;  Laterality: N/A;  . Ablation on endometriosis  10/04/2012    Procedure: ABLATION ON ENDOMETRIOSIS;  Surgeon: Linda Hedges, DO;  Location: [facility];  Service: Gynecology;  Laterality: N/A;    reports that she has never smoked. She has never used smokeless tobacco. She reports that she drinks alcohol. She reports that she does not use illicit drugs. family history includes Arthritis in her mother; Cancer in her father and paternal grandfather; Diabetes in her sister; Hashimoto's thyroiditis in her sister and sister; Heart attack in her mother; Heart disease in her mother; Hyperlipidemia in her mother; Hypertension in her father and mother; Thyroid disease in her sister and sister. Allergies  Allergen Reactions  . Flagyl [Metronidazole] Rash   Current Outpatient Prescriptions on File Prior to Visit  Medication Sig Dispense Refill  . ALPRAZolam (XANAX) 0.25 MG tablet Take 1 tablet (0.25 mg total) by mouth 3 (three) times daily as needed. 60 tablet 0  . buPROPion (WELLBUTRIN XL) 150 MG 24 hr tablet Take 1 tablet (150 mg total) by mouth 2 (two) times daily. 180 tablet 1  . lidocaine (XYLOCAINE) 2 % solution Use as directed 20 mLs in the mouth or throat every 6 (six) hours as needed for mouth pain. 100 mL 0  . loratadine (CLARITIN) 10 MG tablet Take 10 mg by mouth daily.    . montelukast (SINGULAIR) 10 MG tablet Take 10 mg by mouth at bedtime.    [name] Kitchen omeprazole (PRILOSEC) 20 MG capsule Take 20 mg by mouth daily.    . sucralfate (CARAFATE) 1 GM/10ML suspension Take 10 mLs (1 g total) by mouth 4 (four) times daily -  with meals and at bedtime. 420 mL 0   No  current facility-administered medications on file prior to visit.   Review of Systems All otherwise neg per pt     Objective:   Physical Exam BP 122/88 mmHg  Pulse 89  Temp(Src) 98.3 F (36.8 C) (Oral)  Ht 5\' 7"  (1.702 m)  Wt 176 lb (79.833 kg)  BMI 27.56 kg/m2  SpO2 97% VS noted,  Constitutional: Pt appears well-developed, well-nourished.  HENT: Head: NCAT.  Right Ear: External ear normal.  Left Ear: External ear normal.  Eyes: . Pupils are equal, round, and reactive to light. Conjunctivae and EOM are normal Neck: Normal  range of motion. Neck supple.  Bilat tm's without erythema.  Max sinus areas non tender.  Pharynx with mild erythema, no exudate, and some hint of tonsillar tissue below level of tongue base that may be what she is referring to; I am unable to actually apprec the crypts and stones she refers to Cardiovascular: Normal rate and regular rhythm.   Pulmonary/Chest: Effort normal and breath sounds normal.  Neurological: Pt is alert. Not confused , motor grossly intact Skin: Skin is warm. No rash Psychiatric: Pt behavior is normal. No agitation. mild nervous     Assessment & Plan:

## 2014-10-08 NOTE — Assessment & Plan Note (Addendum)
By hx, unable to directly visualize today, so may mild be mild issue at best, but with recurrent symptoms over the past yr, will tx levaquin course, also refer ENT for direct visualization/better exam, and access to definitive tx if needed

## 2014-10-13 ENCOUNTER — Other Ambulatory Visit: Payer: Self-pay | Admitting: Internal Medicine

## 2014-10-14 NOTE — Telephone Encounter (Signed)
Last filled 12/05/13 #180 with 1 refill by Noel Christmas saw that she is now your pt--please advise if okay to refill

## 2014-10-14 NOTE — Telephone Encounter (Signed)
Done erx 

## 2014-10-16 ENCOUNTER — Other Ambulatory Visit: Payer: Self-pay | Admitting: Otolaryngology

## 2014-10-16 DIAGNOSIS — J329 Chronic sinusitis, unspecified: Secondary | ICD-10-CM

## 2014-10-17 ENCOUNTER — Ambulatory Visit
Admission: RE | Admit: 2014-10-17 | Discharge: 2014-10-17 | Disposition: A | Discharge status: Home / Self Care | Payer: 59 | Source: Ambulatory Visit | Attending: Obstetrics & Gynecology | Admitting: Obstetrics & Gynecology

## 2014-10-17 DIAGNOSIS — N6459 Other signs and symptoms in breast: Secondary | ICD-10-CM

## 2015-02-17 ENCOUNTER — Other Ambulatory Visit: Payer: Self-pay | Admitting: Internal Medicine

## 2015-03-11 ENCOUNTER — Other Ambulatory Visit: Payer: Self-pay

## 2015-03-11 MED ORDER — ALPRAZOLAM 0.25 MG PO TABS: 0.2500 mg | ORAL_TABLET | Freq: Two times a day (BID) | ORAL | Status: DC | PRN

## 2015-03-11 NOTE — Telephone Encounter (Signed)
Done hardcopy to Cherina  

## 2015-03-11 NOTE — Telephone Encounter (Signed)
Rx sent to pharmacy   

## 2015-03-11 NOTE — Telephone Encounter (Signed)
Last filled by Regina Baity 2014---last acute OV with you was 10/08/2014--please advise

## 2015-04-06 ENCOUNTER — Telehealth: Payer: Self-pay | Admitting: Internal Medicine

## 2015-04-06 NOTE — Telephone Encounter (Signed)
Pt called in and was under the impression that she was est with Dr Doug Sou.  Is this ok for her to be est with DR Doug Sou?  She wanted a female provider ?

## 2015-04-07 NOTE — Telephone Encounter (Signed)
[place] with me if OK with Dr Doug Sou

## 2015-05-14 NOTE — Telephone Encounter (Signed)
Please offer her that a new female provider is starting in October and that would be a good fit.

## 2015-10-11 ENCOUNTER — Other Ambulatory Visit: Payer: Self-pay | Admitting: Internal Medicine

## 2015-11-11 ENCOUNTER — Ambulatory Visit: Payer: 59 | Admitting: Internal Medicine

## 2015-11-11 ENCOUNTER — Telehealth: Payer: Self-pay | Admitting: Internal Medicine

## 2015-11-11 ENCOUNTER — Ambulatory Visit (INDEPENDENT_AMBULATORY_CARE_PROVIDER_SITE_OTHER): Payer: 59 | Admitting: Family

## 2015-11-11 ENCOUNTER — Encounter: Payer: Self-pay | Admitting: Family

## 2015-11-11 VITALS — BP 110/78 | HR 104 | Temp 98.8°F | Resp 16 | Ht 67.0 in | Wt 172.4 lb

## 2015-11-11 DIAGNOSIS — R05 Cough: Secondary | ICD-10-CM

## 2015-11-11 DIAGNOSIS — R059 Cough, unspecified: Secondary | ICD-10-CM | POA: Insufficient documentation

## 2015-11-11 MED ORDER — LIDOCAINE VISCOUS 2 % MT SOLN: 20.0000 mL | Freq: Four times a day (QID) | OROMUCOSAL | Status: DC | PRN

## 2015-11-11 MED ORDER — HYDROCOD POLST-CPM POLST ER 10-8 MG/5ML PO SUER: 5.0000 mL | Freq: Every evening | ORAL | Status: DC | PRN

## 2015-11-11 MED ORDER — FLUCONAZOLE 150 MG PO TABS: 150.0000 mg | ORAL_TABLET | Freq: Once | ORAL | Status: DC

## 2015-11-11 MED ORDER — AZITHROMYCIN 250 MG PO TABS: ORAL_TABLET | ORAL | Status: DC

## 2015-11-11 NOTE — Telephone Encounter (Signed)
Pt would like to tranfer from [place] to [facility].  She would like a female provider.  It this ok with both of you?

## 2015-11-11 NOTE — Assessment & Plan Note (Signed)
Symptoms and exam consistent with acute upper respiratory infection however there is concern for bronchitis. Start azithromycin. Start Tussionex as needed for cough and sleep. Start Xylocaine as needed for sore throat. Start Diflucan as needed for post antibiotic candidiasis. Continue over-the-counter medications as needed for symptom relief and supportive care. Follow-up if symptoms worsen or fail to improve.

## 2015-11-11 NOTE — Telephone Encounter (Signed)
ok 

## 2015-11-11 NOTE — Progress Notes (Signed)
Subjective:    Patient ID: Pamela Novak, female    DOB: 03-26-1971, 45 y.o.   MRN: [id number]  Chief Complaint  Patient presents with  . Acute Visit    sore throat, chest congestion, neck is sore, coughing up green mucus, x 10 days    HPI:  Pamela Novak is a 45 y.o. female who  has a past medical history of Anxiety; Traumatic hematoma of knee; Breast hematoma; Complication of anesthesia; Depression; Allergy; Hyperlipidemia; GERD (gastroesophageal reflux disease); and Blood in stool. and presents today for an acute office visit.  This is a new problem. Associated symptoms of sore throat, chest congestion, neck soreness, and productive cough with purulent sputum has been going on for approximately 10 days. Denies fevers.  Modifying factors include Sudafed and Mucinex which have helped with symptom management. Severity of her symptoms is worse at night and disturbs her sleep pattern. Denies recent antibiotics.   Allergies  Allergen Reactions  . Flagyl [Metronidazole] Rash     Current Outpatient Prescriptions on File Prior to Visit  Medication Sig Dispense Refill  . ALPRAZolam (XANAX) 0.25 MG tablet Take 1 tablet (0.25 mg total) by mouth 2 (two) times daily as needed. 60 tablet 0  . buPROPion (WELLBUTRIN XL) 150 MG 24 hr tablet TAKE 1 TABLET (150 MG TOTAL) TWO TIMES DAILY 180 tablet 3  . loratadine (CLARITIN) 10 MG tablet Take 10 mg by mouth daily.    . montelukast (SINGULAIR) 10 MG tablet Take 10 mg by mouth at bedtime.    [name] Kitchen omeprazole (PRILOSEC) 20 MG capsule Take 20 mg by mouth daily.    . sucralfate (CARAFATE) 1 GM/10ML suspension Take 10 mLs (1 g total) by mouth 4 (four) times daily -  with meals and at bedtime. 420 mL 0  . triamcinolone (KENALOG) 0.1 % paste PLACE ONTO THE TEETH 2 TIMES A DAY 5 g 0   No current facility-administered medications on file prior to visit.    Review of Systems  Constitutional: Negative for fever and chills.  HENT: Positive for congestion,  sore throat and voice change. Negative for sinus pressure.   Respiratory: Positive for cough.   Cardiovascular: Negative for chest pain and leg swelling.      Objective:    BP 110/78 mmHg  Pulse 104  Temp(Src) 98.8 F (37.1 C) (Oral)  Resp 16  Ht 5\' 7"  (1.702 m)  Wt 172 lb 6.4 oz (78.2 kg)  BMI 27.00 kg/m2  SpO2 97% Nursing note and vital signs reviewed.  Physical Exam  Constitutional: She is oriented to person, place, and time. She appears well-developed and well-nourished. No distress.  HENT:  Right Ear: Hearing, tympanic membrane, external ear and ear canal normal.  Left Ear: Hearing, tympanic membrane, external ear and ear canal normal.  Nose: Nose normal. Right sinus exhibits no maxillary sinus tenderness and no frontal sinus tenderness. Left sinus exhibits no maxillary sinus tenderness and no frontal sinus tenderness.  Mouth/Throat: Uvula is midline, oropharynx is clear and moist and mucous membranes are normal.  Cardiovascular: Normal rate, regular rhythm, normal heart sounds and intact distal pulses.   Pulmonary/Chest: Effort normal and breath sounds normal.  Neurological: She is alert and oriented to person, place, and time.  Skin: Skin is warm and dry.  Psychiatric: She has a normal mood and affect. Her behavior is normal. Judgment and thought content normal.       Assessment & Plan:   Problem List Items Addressed This Visit  Other   Cough - Primary    Symptoms and exam consistent with acute upper respiratory infection however there is concern for bronchitis. Start azithromycin. Start Tussionex as needed for cough and sleep. Start Xylocaine as needed for sore throat. Start Diflucan as needed for post antibiotic candidiasis. Continue over-the-counter medications as needed for symptom relief and supportive care. Follow-up if symptoms worsen or fail to improve.      Relevant Medications   azithromycin (ZITHROMAX) 250 MG tablet   chlorpheniramine-HYDROcodone  (TUSSIONEX PENNKINETIC ER) 10-8 MG/5ML SUER   lidocaine (XYLOCAINE) 2 % solution   fluconazole (DIFLUCAN) 150 MG tablet

## 2015-11-11 NOTE — Telephone Encounter (Signed)
Ok with me 

## 2015-11-11 NOTE — Progress Notes (Signed)
Pre visit review using our clinic review tool, if applicable. No additional management support is needed unless otherwise documented below in the visit note. 

## 2015-11-11 NOTE — Patient Instructions (Signed)
Thank you for choosing [facility].  Summary/Instructions:  Your prescription(s) have been submitted to your pharmacy or been printed and provided for you. Please take as directed and contact our office if you believe you are having problem(s) with the medication(s) or have any questions.  If your symptoms worsen or fail to improve, please contact our office for further instruction, or in case of emergency go directly to the emergency room at the closest medical facility.   General Recommendations:    Please drink plenty of fluids.  Get plenty of rest   Sleep in humidified air  Use saline nasal sprays  Netti pot   OTC Medications:  Decongestants - helps relieve congestion   Flonase (generic fluticasone) or Nasacort (generic triamcinolone) - please make sure to use the "cross-over" technique at a 45 degree angle towards the opposite eye as opposed to straight up the nasal passageway.   Sudafed (generic pseudoephedrine - Note this is the one that is available behind the pharmacy counter); Products with phenylephrine (-PE) may also be used but is often not as effective as pseudoephedrine.   If you have HIGH BLOOD PRESSURE - Coricidin HBP; AVOID any product that is -D as this contains pseudoephedrine which may increase your blood pressure.  Afrin (oxymetazoline) every 6-8 hours for up to 3 days.   Allergies - helps relieve runny nose, itchy eyes and sneezing   Claritin (generic loratidine), Allegra (fexofenidine), or Zyrtec (generic cyrterizine) for runny nose. These medications should not cause drowsiness.  Note - Benadryl (generic diphenhydramine) may be used however may cause drowsiness  Cough -   Delsym or Robitussin (generic dextromethorphan)  Expectorants - helps loosen mucus to ease removal   Mucinex (generic guaifenesin) as directed on the package.  Headaches / General Aches   Tylenol (generic acetaminophen) - DO NOT EXCEED 3 grams (3,000 mg) in a 24  hour time period  Advil/Motrin (generic ibuprofen)   Sore Throat -   Salt water gargle   Chloraseptic (generic benzocaine) spray or lozenges / Sucrets (generic dyclonine)      

## 2015-11-12 NOTE — Telephone Encounter (Signed)
Left messaged for patient to call back to scheduled

## 2016-01-12 ENCOUNTER — Encounter: Payer: Self-pay | Admitting: Internal Medicine

## 2016-01-18 ENCOUNTER — Other Ambulatory Visit (INDEPENDENT_AMBULATORY_CARE_PROVIDER_SITE_OTHER): Payer: 59

## 2016-01-18 ENCOUNTER — Encounter: Payer: Self-pay | Admitting: Internal Medicine

## 2016-01-18 ENCOUNTER — Ambulatory Visit (INDEPENDENT_AMBULATORY_CARE_PROVIDER_SITE_OTHER): Payer: 59 | Admitting: Internal Medicine

## 2016-01-18 VITALS — BP 124/84 | HR 89 | Temp 98.3°F | Resp 16 | Wt 174.0 lb

## 2016-01-18 DIAGNOSIS — K644 Residual hemorrhoidal skin tags: Secondary | ICD-10-CM | POA: Diagnosis not present

## 2016-01-18 DIAGNOSIS — F411 Generalized anxiety disorder: Secondary | ICD-10-CM

## 2016-01-18 DIAGNOSIS — F32A Depression, unspecified: Secondary | ICD-10-CM

## 2016-01-18 DIAGNOSIS — Z0000 Encounter for general adult medical examination without abnormal findings: Secondary | ICD-10-CM | POA: Diagnosis not present

## 2016-01-18 DIAGNOSIS — K219 Gastro-esophageal reflux disease without esophagitis: Secondary | ICD-10-CM

## 2016-01-18 DIAGNOSIS — F329 Major depressive disorder, single episode, unspecified: Secondary | ICD-10-CM

## 2016-01-18 DIAGNOSIS — Z23 Encounter for immunization: Secondary | ICD-10-CM | POA: Diagnosis not present

## 2016-01-18 DIAGNOSIS — K648 Other hemorrhoids: Secondary | ICD-10-CM

## 2016-01-18 DIAGNOSIS — F4310 Post-traumatic stress disorder, unspecified: Secondary | ICD-10-CM | POA: Diagnosis not present

## 2016-01-18 LAB — CBC WITH DIFFERENTIAL/PLATELET
Basophils Absolute: 0 10*3/uL (ref 0.0–0.1)
Basophils Relative: 0.4 % (ref 0.0–3.0)
Eosinophils Absolute: 0.1 10*3/uL (ref 0.0–0.7)
Eosinophils Relative: 1.6 % (ref 0.0–5.0)
HCT: 40.8 % (ref 36.0–46.0)
Hemoglobin: 13.8 g/dL (ref 12.0–15.0)
Lymphocytes Relative: 17.3 % (ref 12.0–46.0)
Lymphs Abs: 1.2 10*3/uL (ref 0.7–4.0)
MCHC: 33.7 g/dL (ref 30.0–36.0)
MCV: 84.4 fl (ref 78.0–100.0)
Monocytes Absolute: 0.5 10*3/uL (ref 0.1–1.0)
Monocytes Relative: 6.4 % (ref 3.0–12.0)
Neutro Abs: 5.3 10*3/uL (ref 1.4–7.7)
Neutrophils Relative %: 74.3 % (ref 43.0–77.0)
Platelets: 236 10*3/uL (ref 150.0–400.0)
RBC: 4.84 Mil/uL (ref 3.87–5.11)
RDW: 13.2 % (ref 11.5–15.5)
WBC: 7.1 10*3/uL (ref 4.0–10.5)

## 2016-01-18 LAB — TSH: TSH: 1.61 u[IU]/mL (ref 0.35–4.50)

## 2016-01-18 LAB — COMPREHENSIVE METABOLIC PANEL
ALT: 16 U/L (ref 0–35)
AST: 17 U/L (ref 0–37)
Albumin: 4.5 g/dL (ref 3.5–5.2)
Alkaline Phosphatase: 48 U/L (ref 39–117)
BUN: 15 mg/dL (ref 6–23)
CO2: 27 mEq/L (ref 19–32)
Calcium: 9.1 mg/dL (ref 8.4–10.5)
Chloride: 104 mEq/L (ref 96–112)
Creatinine, Ser: 0.94 mg/dL (ref 0.40–1.20)
GFR: 68.03 mL/min (ref >60.00)
Glucose, Bld: 104 mg/dL — ABNORMAL HIGH (ref 70–99)
Potassium: 3.9 mEq/L (ref 3.5–5.1)
Sodium: 138 mEq/L (ref 135–145)
Total Bilirubin: 0.4 mg/dL (ref 0.2–1.2)
Total Protein: 6.9 g/dL (ref 6.0–8.3)

## 2016-01-18 LAB — HEMOGLOBIN A1C: Hgb A1c MFr Bld: 5.8 % (ref 4.6–6.5)

## 2016-01-18 LAB — LIPID PANEL
Cholesterol: 266 mg/dL — ABNORMAL HIGH (ref 0–200)
HDL: 46.5 mg/dL (ref >39.00)
LDL Cholesterol: 190 mg/dL — ABNORMAL HIGH (ref 0–99)
NonHDL: 219.8
Total CHOL/HDL Ratio: 6
Triglycerides: 150 mg/dL — ABNORMAL HIGH (ref 0.0–149.0)
VLDL: 30 mg/dL (ref 0.0–40.0)

## 2016-01-18 MED ORDER — HYDROCORTISONE ACETATE 25 MG RE SUPP: 25.0000 mg | Freq: Two times a day (BID) | RECTAL | Status: DC

## 2016-01-18 MED ORDER — HYDROCORTISONE 2.5 % RE CREA: 1.0000 "application " | TOPICAL_CREAM | Freq: Two times a day (BID) | RECTAL | Status: DC

## 2016-01-18 NOTE — Progress Notes (Signed)
Subjective:    Patient ID: Pamela Novak, female    DOB: 10-04-1971, 45 y.o.   MRN: [id number]  HPI She is here for a physical exam.   Anxiety, depression, PTSD:  She has PTSD from miliary service.  She sees a therapist at the [place] and gets her medication from there.    Concern for osteoporosis:  Her mother had severe OP and she is concerned about developing it with the medication she is on.  She is currently not exercising regularly.   GERD:  She takes omeprazole 20 mg twice daily.  She does not have GERD symptoms.  She has a long history of hoarseness and has to clear her throat frequently.    Hoarseness:  Her voice has gotten deeper over the years.  She feels her voice is tired.  Her allergist recommended an EGD, but she has not pursued that.    Hemorrhoids: She has had a colonoscopy in the past and has known internal and external hemorrhoids. They're currently flared and she has been using over-the-counter medications, which are not working well.  Medications and allergies reviewed with patient and updated if appropriate.  Patient Active Problem List   Diagnosis Date Noted  . Recurrent tonsillitis 10/08/2014  . Varicose veins of lower extremities with other complications 123456  . Pain in limb 07/18/2013  . Varicose veins 05/21/2013  . Generalized skin lesions 05/21/2013  . GAD (generalized anxiety disorder) 11/02/2012  . Prolapsed internal hemorrhoids, circumferential 09/26/2012  . Lipoma of skin and subcutaneous tissue of neck 09/26/2012    Current Outpatient Prescriptions on File Prior to Visit  Medication Sig Dispense Refill  . ALPRAZolam (XANAX) 0.25 MG tablet Take 1 tablet (0.25 mg total) by mouth 2 (two) times daily as needed. 60 tablet 0  . montelukast (SINGULAIR) 10 MG tablet Take 10 mg by mouth at bedtime.    [name] Kitchen omeprazole (PRILOSEC) 20 MG capsule Take 20 mg by mouth daily.     No current facility-administered medications on file prior to visit.    Past  Medical History  Diagnosis Date  . Anxiety   . Traumatic hematoma of knee   . Breast hematoma   . Complication of anesthesia     "woke up during colonoscopy"  . Depression   . Allergy   . Hyperlipidemia   . GERD (gastroesophageal reflux disease)   . Blood in stool     Past Surgical History  Procedure Laterality Date  . Colonoscopy    . Laparoscopy  10/04/2012    Procedure: LAPAROSCOPY OPERATIVE;  Surgeon: Linda Hedges, DO;  Location: [facility];  Service: Gynecology;  Laterality: N/A;  . Dilitation & currettage/hystroscopy with novasure ablation  10/04/2012    Procedure: DILATATION & CURETTAGE/HYSTEROSCOPY WITH NOVASURE ABLATION;  Surgeon: Linda Hedges, DO;  Location: [facility];  Service: Gynecology;  Laterality: N/A;  . Condyloma excision/fulguration  10/04/2012    Procedure: CONDYLOMA REMOVAL;  Surgeon: Linda Hedges, DO;  Location: [facility];  Service: Gynecology;  Laterality: N/A;  . Ablation on endometriosis  10/04/2012    Procedure: ABLATION ON ENDOMETRIOSIS;  Surgeon: Linda Hedges, DO;  Location: [facility];  Service: Gynecology;  Laterality: N/A;    Social History   Social History  . Marital Status: Married    Spouse Name: N/A  . Number of Children: 2  . Years of Education: 12+   Occupational History  . adm officer    Social History Main Topics  . Smoking status: Never Smoker   .  Smokeless tobacco: Never Used  . Alcohol Use: Yes     Comment: rare  . Drug Use: No  . Sexual Activity: Yes   Other Topics Concern  . None   Social History Narrative   Regular exercise-no   Caffeine Use-yes    Family History  Problem Relation Age of Onset  . Heart disease Mother   . Arthritis Mother   . Hyperlipidemia Mother   . Hypertension Mother   . Heart attack Mother   . Cancer Father     Colon Cancer-Polyps  . Hypertension Father   . Hashimoto's thyroiditis Sister   . Diabetes Sister   . Thyroid disease Sister   . Cancer Paternal Grandfather     Colon Cancer  . Hashimoto's  thyroiditis Sister   . Thyroid disease Sister     Review of Systems  Constitutional: Negative for fever, chills, appetite change and unexpected weight change.  HENT: Positive for voice change. Negative for hearing loss.   Eyes: Negative for visual disturbance.  Respiratory: Negative for cough, shortness of breath and wheezing.   Cardiovascular: Positive for chest pain (on occasion, related to anxiety). Negative for palpitations and leg swelling.  Gastrointestinal: Positive for abdominal pain (cramping intermittently from IBS), diarrhea, constipation and blood in stool (during menses). Negative for nausea.       No typical gerd symptoms  Genitourinary: Negative for dysuria and hematuria.  Musculoskeletal: Positive for back pain (lower back). Negative for myalgias.  Neurological: Positive for dizziness (possibly medication related - feels off balanced at times). Negative for light-headedness and headaches.  Psychiatric/Behavioral: Positive for dysphoric mood. The patient is nervous/anxious.        Following with psychiatry       Objective:   Filed Vitals:   01/18/16 1012  BP: 124/84  Pulse: 89  Temp: 98.3 F (36.8 C)  Resp: 16   Filed Weights   01/18/16 1012  Weight: 174 lb (78.926 kg)   Body mass index is 27.25 kg/(m^2).   Physical Exam Constitutional: She appears well-developed and well-nourished. No distress.  HENT:  Head: Normocephalic and atraumatic.  Right Ear: External ear normal. Normal ear canal and TM Left Ear: External ear normal.  Normal ear canal and TM Mouth/Throat: Oropharynx is clear and moist.  Normal bilateral ear canals and tympanic membranes  Eyes: Conjunctivae and EOM are normal.  Neck: Neck supple. No tracheal deviation present. No thyromegaly present.  No carotid bruit  Cardiovascular: Normal rate, regular rhythm and normal heart sounds.   No murmur heard.  No edema. Pulmonary/Chest: Effort normal and breath sounds normal. No respiratory distress.  She has no wheezes. She has no rales.  Breast: deferred to Gyn Abdominal: Soft. She exhibits no distension. There is no tenderness.  Lymphadenopathy: She has no cervical adenopathy.  Skin: Skin is warm and dry. She is not diaphoretic.  Psychiatric: She has a normal mood and affect. Her behavior is normal.       Assessment & Plan:    Physical exam: Screening blood work ordered Immunizations - flu deferred, tdap today Colonoscopy - has had one -- due in 2018 Mammogram up to date Gyn - up to date Eye exams up to date Exercise - not currently exercising, stressed importance of regular exercise Weight, overweight, advised to work on weight loss Substance abuse - no evidence of substance abuse  Advised taking vitamin D daily 1000 units Try to get 1200 mg of calcium. Diet daily-may need to supplement 500-600 mg daily  See Problem List for Assessment and Plan of chronic medical problems.

## 2016-01-18 NOTE — Patient Instructions (Addendum)
Start taking vitamin d daily 1000 units daily.  Ideally try to get 1200 mg of calcium in your diet with food.  If you need to supplement take no more than 500-600 mg daily of calcium.     Test(s) ordered today. Your results will be released to [facility] (or called to you) after review, usually within 72hours after test completion. If any changes need to be made, you will be notified at that same time.  All other Health Maintenance issues reviewed.   All recommended immunizations and age-appropriate screenings are up-to-date.  Tdap vaccine administered today.   Medications reviewed and updated.  Changes include trying medication for your hemorrhoids.    Your prescription(s) have been submitted to your pharmacy. Please take as directed and contact our office if you believe you are having problem(s) with the medication(s).  A referral was ordered for gastroenterology for her heartburn.   Please followup annually   Health Maintenance, Female Adopting a healthy lifestyle and getting preventive care can go a long way to promote health and wellness. Talk with your health care provider about what schedule of regular examinations is right for you. This is a good chance for you to check in with your provider about disease prevention and staying healthy. In between checkups, there are plenty of things you can do on your own. Experts have done a lot of research about which lifestyle changes and preventive measures are most likely to keep you healthy. Ask your health care provider for more information. WEIGHT AND DIET  Eat a healthy diet  Be sure to include plenty of vegetables, fruits, low-fat dairy products, and lean protein.  Do not eat a lot of foods high in solid fats, added sugars, or salt.  Get regular exercise. This is one of the most important things you can do for your health.  Most adults should exercise for at least 150 minutes each week. The exercise should increase your heart rate and  make you sweat (moderate-intensity exercise).  Most adults should also do strengthening exercises at least twice a week. This is in addition to the moderate-intensity exercise.  Maintain a healthy weight  Body mass index (BMI) is a measurement that can be used to identify possible weight problems. It estimates body fat based on height and weight. Your health care provider can help determine your BMI and help you achieve or maintain a healthy weight.  For females 80 years of age and older:   A BMI below 18.5 is considered underweight.  A BMI of 18.5 to 24.9 is normal.  A BMI of 25 to 29.9 is considered overweight.  A BMI of 30 and above is considered obese.  Watch levels of cholesterol and blood lipids  You should start having your blood tested for lipids and cholesterol at 47 years of age, then have this test every 5 years.  You may need to have your cholesterol levels checked more often if:  Your lipid or cholesterol levels are high.  You are older than 47 years of age.  You are at high risk for heart disease.  CANCER SCREENING   Lung Cancer  Lung cancer screening is recommended for adults 12-40 years old who are at high risk for lung cancer because of a history of smoking.  A yearly low-dose CT scan of the lungs is recommended for people who:  Currently smoke.  Have quit within the past 15 years.  Have at least a 30-pack-year history of smoking. A pack year is  smoking an average of one pack of cigarettes a day for 1 year.  Yearly screening should continue until it has been 15 years since you quit.  Yearly screening should stop if you develop a health problem that would prevent you from having lung cancer treatment.  Breast Cancer  Practice breast self-awareness. This means understanding how your breasts normally appear and feel.  It also means doing regular breast self-exams. Let your health care provider know about any changes, no matter how small.  If you  are in your 20s or 30s, you should have a clinical breast exam (CBE) by a health care provider every 1-3 years as part of a regular health exam.  If you are 74 or older, have a CBE every year. Also consider having a breast X-ray (mammogram) every year.  If you have a family history of breast cancer, talk to your health care provider about genetic screening.  If you are at high risk for breast cancer, talk to your health care provider about having an MRI and a mammogram every year.  Breast cancer gene (BRCA) assessment is recommended for women who have family members with BRCA-related cancers. BRCA-related cancers include:  Breast.  Ovarian.  Tubal.  Peritoneal cancers.  Results of the assessment will determine the need for genetic counseling and BRCA1 and BRCA2 testing. Cervical Cancer Your health care provider may recommend that you be screened regularly for cancer of the pelvic organs (ovaries, uterus, and vagina). This screening involves a pelvic examination, including checking for microscopic changes to the surface of your cervix (Pap test). You may be encouraged to have this screening done every 3 years, beginning at age 49.  For women ages 24-65, health care providers may recommend pelvic exams and Pap testing every 3 years, or they may recommend the Pap and pelvic exam, combined with testing for human papilloma virus (HPV), every 5 years. Some types of HPV increase your risk of cervical cancer. Testing for HPV may also be done on women of any age with unclear Pap test results.  Other health care providers may not recommend any screening for nonpregnant women who are considered low risk for pelvic cancer and who do not have symptoms. Ask your health care provider if a screening pelvic exam is right for you.  If you have had past treatment for cervical cancer or a condition that could lead to cancer, you need Pap tests and screening for cancer for at least 20 years after your  treatment. If Pap tests have been discontinued, your risk factors (such as having a new sexual partner) need to be reassessed to determine if screening should resume. Some women have medical problems that increase the chance of getting cervical cancer. In these cases, your health care provider may recommend more frequent screening and Pap tests. Colorectal Cancer  This type of cancer can be detected and often prevented.  Routine colorectal cancer screening usually begins at 47 years of age and continues through 47 years of age.  Your health care provider may recommend screening at an earlier age if you have risk factors for colon cancer.  Your health care provider may also recommend using home test kits to check for hidden blood in the stool.  A small camera at the end of a tube can be used to examine your colon directly (sigmoidoscopy or colonoscopy). This is done to check for the earliest forms of colorectal cancer.  Routine screening usually begins at age 41.  Direct examination of the  colon should be repeated every 5-10 years through 47 years of age. However, you may need to be screened more often if early forms of precancerous polyps or small growths are found. Skin Cancer  Check your skin from head to toe regularly.  Tell your health care provider about any new moles or changes in moles, especially if there is a change in a mole's shape or color.  Also tell your health care provider if you have a mole that is larger than the size of a pencil eraser.  Always use sunscreen. Apply sunscreen liberally and repeatedly throughout the day.  Protect yourself by wearing long sleeves, pants, a wide-brimmed hat, and sunglasses whenever you are outside. HEART DISEASE, DIABETES, AND HIGH BLOOD PRESSURE   High blood pressure causes heart disease and increases the risk of stroke. High blood pressure is more likely to develop in:  People who have blood pressure in the high end of the normal range  (130-139/85-89 mm Hg).  People who are overweight or obese.  People who are African American.  If you are 44-59 years of age, have your blood pressure checked every 3-5 years. If you are 29 years of age or older, have your blood pressure checked every year. You should have your blood pressure measured twice--once when you are at a hospital or clinic, and once when you are not at a hospital or clinic. Record the average of the two measurements. To check your blood pressure when you are not at a hospital or clinic, you can use:  An automated blood pressure machine at a pharmacy.  A home blood pressure monitor.  If you are between 50 years and 69 years old, ask your health care provider if you should take aspirin to prevent strokes.  Have regular diabetes screenings. This involves taking a blood sample to check your fasting blood sugar level.  If you are at a normal weight and have a low risk for diabetes, have this test once every three years after 47 years of age.  If you are overweight and have a high risk for diabetes, consider being tested at a younger age or more often. PREVENTING INFECTION  Hepatitis B  If you have a higher risk for hepatitis B, you should be screened for this virus. You are considered at high risk for hepatitis B if:  You were born in a country where hepatitis B is common. Ask your health care provider which countries are considered high risk.  Your parents were born in a high-risk country, and you have not been immunized against hepatitis B (hepatitis B vaccine).  You have HIV or AIDS.  You use needles to inject street drugs.  You live with someone who has hepatitis B.  You have had sex with someone who has hepatitis B.  You get hemodialysis treatment.  You take certain medicines for conditions, including cancer, organ transplantation, and autoimmune conditions. Hepatitis C  Blood testing is recommended for:  Everyone born from [birth year] through  1967.  Anyone with known risk factors for hepatitis C. Sexually transmitted infections (STIs)  You should be screened for sexually transmitted infections (STIs) including gonorrhea and chlamydia if:  You are sexually active and are younger than 47 years of age.  You are older than 47 years of age and your health care provider tells you that you are at risk for this type of infection.  Your sexual activity has changed since you were last screened and you are at an increased risk  for chlamydia or gonorrhea. Ask your health care provider if you are at risk.  If you do not have HIV, but are at risk, it may be recommended that you take a prescription medicine daily to prevent HIV infection. This is called pre-exposure prophylaxis (PrEP). You are considered at risk if:  You are sexually active and do not regularly use condoms or know the HIV status of your partner(s).  You take drugs by injection.  You are sexually active with a partner who has HIV. Talk with your health care provider about whether you are at high risk of being infected with HIV. If you choose to begin PrEP, you should first be tested for HIV. You should then be tested every 3 months for as long as you are taking PrEP.  PREGNANCY   If you are premenopausal and you may become pregnant, ask your health care provider about preconception counseling.  If you may become pregnant, take 400 to 800 micrograms (mcg) of folic acid every day.  If you want to prevent pregnancy, talk to your health care provider about birth control (contraception). OSTEOPOROSIS AND MENOPAUSE   Osteoporosis is a disease in which the bones lose minerals and strength with aging. This can result in serious bone fractures. Your risk for osteoporosis can be identified using a bone density scan.  If you are 33 years of age or older, or if you are at risk for osteoporosis and fractures, ask your health care provider if you should be screened.  Ask your health  care provider whether you should take a calcium or vitamin D supplement to lower your risk for osteoporosis.  Menopause may have certain physical symptoms and risks.  Hormone replacement therapy may reduce some of these symptoms and risks. Talk to your health care provider about whether hormone replacement therapy is right for you.  HOME CARE INSTRUCTIONS   Schedule regular health, dental, and eye exams.  Stay current with your immunizations.   Do not use any tobacco products including cigarettes, chewing tobacco, or electronic cigarettes.  If you are pregnant, do not drink alcohol.  If you are breastfeeding, limit how much and how often you drink alcohol.  Limit alcohol intake to no more than 1 drink per day for nonpregnant women. One drink equals 12 ounces of beer, 5 ounces of wine, or 1 ounces of hard liquor.  Do not use street drugs.  Do not share needles.  Ask your health care provider for help if you need support or information about quitting drugs.  Tell your health care provider if you often feel depressed.  Tell your health care provider if you have ever been abused or do not feel safe at home.   This information is not intended to replace advice given to you by your health care provider. Make sure you discuss any questions you have with your health care provider.   Document Released: 05/02/2011 Document Revised: 11/07/2014 Document Reviewed: 09/18/2013 Elsevier Interactive Patient Education [employer].

## 2016-01-18 NOTE — Assessment & Plan Note (Signed)
Following with psychiatry at the [facility] Taking sertraline daily Uses Xanax as needed

## 2016-01-18 NOTE — Assessment & Plan Note (Signed)
No typical heartburn symptoms, but experiencing hoarseness, which is likely related to uncontrolled GERD Will refer to GI for possible EGD Since she is asymptomatic we will not change her medication-continue omeprazole at current dosing

## 2016-01-18 NOTE — Assessment & Plan Note (Signed)
Following with psychiatry at the [facility] Medication management program

## 2016-01-18 NOTE — Assessment & Plan Note (Signed)
Management psychiatry at the [facility]

## 2016-01-18 NOTE — Assessment & Plan Note (Signed)
Prescription medications and to pharmacy She will let me know if there is no improvement

## 2016-01-18 NOTE — Progress Notes (Signed)
Pre visit review using our clinic review tool, if applicable. No additional management support is needed unless otherwise documented below in the visit note. 

## 2016-01-21 ENCOUNTER — Encounter: Payer: Self-pay | Admitting: Internal Medicine

## 2016-01-21 DIAGNOSIS — E785 Hyperlipidemia, unspecified: Secondary | ICD-10-CM | POA: Insufficient documentation

## 2016-03-02 ENCOUNTER — Encounter: Payer: Self-pay | Admitting: *Deleted

## 2016-10-28 ENCOUNTER — Encounter: Payer: Self-pay | Admitting: Nurse Practitioner

## 2016-10-28 ENCOUNTER — Ambulatory Visit (INDEPENDENT_AMBULATORY_CARE_PROVIDER_SITE_OTHER)
Admission: RE | Admit: 2016-10-28 | Discharge: 2016-10-28 | Disposition: A | Discharge status: Home / Self Care | Payer: 59 | Source: Ambulatory Visit | Attending: Nurse Practitioner | Admitting: Nurse Practitioner

## 2016-10-28 ENCOUNTER — Ambulatory Visit (INDEPENDENT_AMBULATORY_CARE_PROVIDER_SITE_OTHER): Payer: 59 | Admitting: Nurse Practitioner

## 2016-10-28 VITALS — BP 150/80 | HR 103 | Temp 97.5°F | Resp 16 | Ht 67.0 in | Wt 175.0 lb

## 2016-10-28 DIAGNOSIS — J069 Acute upper respiratory infection, unspecified: Secondary | ICD-10-CM | POA: Diagnosis not present

## 2016-10-28 DIAGNOSIS — J209 Acute bronchitis, unspecified: Secondary | ICD-10-CM | POA: Diagnosis not present

## 2016-10-28 MED ORDER — METHYLPREDNISOLONE ACETATE 40 MG/ML IJ SUSP: 40.0000 mg | Freq: Once | INTRAMUSCULAR | Status: DC

## 2016-10-28 MED ORDER — FLUTICASONE PROPIONATE 50 MCG/ACT NA SUSP: 2.0000 | Freq: Every day | NASAL | 0 refills | Status: DC

## 2016-10-28 MED ORDER — METHYLPREDNISOLONE ACETATE 40 MG/ML IJ SUSP
40.0000 mg | Freq: Once | INTRAMUSCULAR | Status: AC
  Administered 2016-10-28: 40 mg via INTRAMUSCULAR

## 2016-10-28 MED ORDER — DM-GUAIFENESIN ER 30-600 MG PO TB12: 1.0000 | ORAL_TABLET | Freq: Two times a day (BID) | ORAL | 0 refills | Status: DC | PRN

## 2016-10-28 MED ORDER — HYDROCODONE-HOMATROPINE 5-1.5 MG/5ML PO SYRP: 5.0000 mL | ORAL_SOLUTION | Freq: Every evening | ORAL | 0 refills | Status: DC | PRN

## 2016-10-28 NOTE — Progress Notes (Signed)
Normal results, see office note

## 2016-10-28 NOTE — Patient Instructions (Signed)
[facility] Instructions: Flonase and Afrin use: apply 1spray of afrin in each nare, wait 78mins, then apply 2sprays of flonase in each nare. Use both nasal spray consecutively x 3days, then flonase only for at least 14days.  Encourage adequate oral hydration.  Use over-the-counter  "cold" medicines  such as "Tylenol cold" , "Advil cold",  "Mucinex" or" Mucinex DM"  for cough and congestion.  Avoid decongestants if you have high blood pressure. Use" Delsym" or" Robitussin" cough syrup varietis for cough.  You can use plain "Tylenol" or "advil" for fever, chills and achyness.

## 2016-10-28 NOTE — Progress Notes (Signed)
Pre visit review using our clinic review tool, if applicable. No additional management support is needed unless otherwise documented below in the visit note. 

## 2016-10-28 NOTE — Progress Notes (Signed)
Subjective:  Patient ID: Pamela Novak, female    DOB: 1971/04/17  Age: 45 y.o. MRN: [id number]  CC: Acute Visit (joint pain, headaches, body aches then turned into cough, head and chest congestion x 5 days)   URI   This is a new problem. The current episode started in the past 7 days. The problem has been gradually improving. There has been no fever. Associated symptoms include chest pain, congestion, coughing, headaches, rhinorrhea, sinus pain, sneezing, a sore throat and wheezing. Pertinent negatives include no abdominal pain, diarrhea, dysuria, ear pain, joint pain, joint swelling, nausea, neck pain, plugged ear sensation, rash, swollen glands or vomiting. She has tried increased fluids, decongestant, acetaminophen, NSAIDs and inhaler use for the symptoms. The treatment provided mild relief.    Outpatient Medications Prior to Visit  Medication Sig Dispense Refill  . ALPRAZolam (XANAX) 0.25 MG tablet Take 1 tablet (0.25 mg total) by mouth 2 (two) times daily as needed. 60 tablet 0  . buPROPion (WELLBUTRIN XL) 300 MG 24 hr tablet Take 300 mg by mouth daily.    . montelukast (SINGULAIR) 10 MG tablet Take 10 mg by mouth at bedtime.    [name] Kitchen omeprazole (PRILOSEC) 20 MG capsule Take 20 mg by mouth daily.    . sertraline (ZOLOFT) 50 MG tablet Take 50 mg by mouth daily. Take 1.5 tablets twice daily.    . cetirizine (ZYRTEC) 10 MG tablet Take 10 mg by mouth daily.    . hydrocortisone (ANUSOL-HC) 2.5 % rectal cream Place 1 application rectally 2 (two) times daily. (Patient not taking: Reported on 10/28/2016) 30 g 0  . hydrocortisone (ANUSOL-HC) 25 MG suppository Place 1 suppository (25 mg total) rectally 2 (two) times daily. (Patient not taking: Reported on 10/28/2016) 12 suppository 0   No facility-administered medications prior to visit.     ROS See HPI  Objective:  BP (!) 150/80   Pulse (!) 103   Temp 97.5 F (36.4 C) (Oral)   Resp 16   Ht 5\' 7"  (1.702 m)   Wt 175 lb (79.4 kg)   SpO2  98%   BMI 27.41 kg/m   BP Readings from Last 3 Encounters:  10/28/16 (!) 150/80  01/18/16 124/84  11/11/15 110/78    Wt Readings from Last 3 Encounters:  10/28/16 175 lb (79.4 kg)  01/18/16 174 lb (78.9 kg)  11/11/15 172 lb 6.4 oz (78.2 kg)    Physical Exam  Constitutional: She is oriented to person, place, and time.  HENT:  Right Ear: Tympanic membrane, external ear and ear canal normal.  Left Ear: Tympanic membrane, external ear and ear canal normal.  Nose: Mucosal edema and rhinorrhea present. Right sinus exhibits no maxillary sinus tenderness and no frontal sinus tenderness. Left sinus exhibits no maxillary sinus tenderness and no frontal sinus tenderness.  Mouth/Throat: Uvula is midline. No trismus in the jaw. Posterior oropharyngeal erythema present. No oropharyngeal exudate.  Eyes: No scleral icterus.  Neck: Normal range of motion. Neck supple.  Cardiovascular: Normal rate and normal heart sounds.   Pulmonary/Chest: Effort normal and breath sounds normal.  Musculoskeletal: She exhibits no edema.  Lymphadenopathy:    She has no cervical adenopathy.  Neurological: She is alert and oriented to person, place, and time.  Vitals reviewed.   Lab Results  Component Value Date   WBC 7.1 01/18/2016   HGB 13.8 01/18/2016   HCT 40.8 01/18/2016   PLT 236.0 01/18/2016   GLUCOSE 104 (H) 01/18/2016   CHOL 266 (H)  01/18/2016   TRIG 150.0 (H) 01/18/2016   HDL 46.50 01/18/2016   LDLCALC 190 (H) 01/18/2016   ALT 16 01/18/2016   AST 17 01/18/2016   NA 138 01/18/2016   K 3.9 01/18/2016   CL 104 01/18/2016   CREATININE 0.94 01/18/2016   BUN 15 01/18/2016   CO2 27 01/18/2016   TSH 1.61 01/18/2016   HGBA1C 5.8 01/18/2016    Mm Digital Diagnostic Bilat  Result Date: 10/17/2014 CLINICAL DATA:  Palpable lump in the upper outer quadrant of the left breast over the past 2 months. Prior trauma to the left breast with a large hematoma in the upper-outer quadrant approximately 2  years ago. Annual evaluation, right breast. EXAM: DIGITAL DIAGNOSTIC BILATERAL MAMMOGRAM WITH CAD ULTRASOUND LEFT BREAST COMPARISON:  Mammography 08/21/2012, 08/04/2010. No prior ultrasound. ACR Breast Density Category b: There are scattered areas of fibroglandular density. FINDINGS: CC and MLO views of both breasts, a spot tangential view of the left breast in the area of palpable concern, and spot compression views of the upper outer left breast, middle depth, were obtained. Corresponding to the area of palpable concern in the upper outer left breast, middle depth, is asymmetric increased density which extends into the subcutaneous fat. There is no discrete mass. Dystrophic type calcifications are present in this region. There is a focal density in the upper inner left breast, middle depth, which persists on spot compression views and which is more apparent than on the prior mammograms. No findings suspicious for malignancy in the right breast. Mammographic images were processed with CAD. On physical exam, there is no palpable abnormality in the upper inner quadrant of the right breast. There is palpable firmness diffusely throughout the upper-outer quadrant, though a discrete mass is difficult to palpate. Ultrasound is performed, showing a cluster of small cysts or focus of apocrine metaplasia at the 11 o'clock position of the left breast approximately 4 cm from the nipple measuring approximately 3 x 3 x 5 mm, without internal color Doppler flow, corresponding to the area of concern on the mammogram. The largest cyst in the cluster measures approximately 2-3 mm. Numerous circumscribed anechoic masses with slight acoustic shadowing are identified in the upper-outer quadrant of the left breast in the area of palpable concern. These are on the order of 3-4 mm in size. No suspicious solid mass or abnormal acoustic shadowing was identified in the upper-outer quadrant. IMPRESSION: 1. Benign oil cysts throughout the  upper-outer quadrant of the left breast in the area of palpable concern, correlating with the prior history of significant trauma and prior hematoma. 2. Benign cluster of small cysts or focus of apocrine metaplasia in the upper inner left breast accounting for an area of concern on mammography. 3. No mammographic or sonographic evidence of malignancy, left breast. 4. No mammographic evidence of malignancy, right breast. RECOMMENDATION: Screening mammogram in one year.(Code:SM-B-01Y) The importance of monthly self breast examination and an annual clinical breast examination was discussed with the patient. I have discussed the findings and recommendations with the patient. Results were also provided in writing at the conclusion of the visit. If applicable, a reminder letter will be sent to the patient regarding the next appointment. BI-RADS CATEGORY  2: Benign. Electronically Signed   By: Evangeline Dakin M.D.   On: 10/17/2014 15:26   US Breast Ltd Uni Left Inc Axilla  Result Date: 10/21/2014 CLINICAL DATA:  Palpable lump in the upper outer quadrant of the left breast over the past 2 months. Prior trauma  to the left breast with a large hematoma in the upper-outer quadrant approximately 2 years ago. Annual evaluation, right breast. EXAM: DIGITAL DIAGNOSTIC BILATERAL MAMMOGRAM WITH CAD ULTRASOUND LEFT BREAST COMPARISON:  Mammography 08/21/2012, 08/04/2010. No prior ultrasound. ACR Breast Density Category b: There are scattered areas of fibroglandular density. FINDINGS: CC and MLO views of both breasts, a spot tangential view of the left breast in the area of palpable concern, and spot compression views of the upper outer left breast, middle depth, were obtained. Corresponding to the area of palpable concern in the upper outer left breast, middle depth, is asymmetric increased density which extends into the subcutaneous fat. There is no discrete mass. Dystrophic type calcifications are present in this region. There  is a focal density in the upper inner left breast, middle depth, which persists on spot compression views and which is more apparent than on the prior mammograms. No findings suspicious for malignancy in the right breast. Mammographic images were processed with CAD. On physical exam, there is no palpable abnormality in the upper inner quadrant of the right breast. There is palpable firmness diffusely throughout the upper-outer quadrant, though a discrete mass is difficult to palpate. Ultrasound is performed, showing a cluster of small cysts or focus of apocrine metaplasia at the 11 o'clock position of the left breast approximately 4 cm from the nipple measuring approximately 3 x 3 x 5 mm, without internal color Doppler flow, corresponding to the area of concern on the mammogram. The largest cyst in the cluster measures approximately 2-3 mm. Numerous circumscribed anechoic masses with slight acoustic shadowing are identified in the upper-outer quadrant of the left breast in the area of palpable concern. These are on the order of 3-4 mm in size. No suspicious solid mass or abnormal acoustic shadowing was identified in the upper-outer quadrant. IMPRESSION: 1. Benign oil cysts throughout the upper-outer quadrant of the left breast in the area of palpable concern, correlating with the prior history of significant trauma and prior hematoma. 2. Benign cluster of small cysts or focus of apocrine metaplasia in the upper inner left breast accounting for an area of concern on mammography. 3. No mammographic or sonographic evidence of malignancy, left breast. 4. No mammographic evidence of malignancy, right breast. RECOMMENDATION: Screening mammogram in one year.(Code:SM-B-01Y) The importance of monthly self breast examination and an annual clinical breast examination was discussed with the patient. I have discussed the findings and recommendations with the patient. Results were also provided in writing at the conclusion of the  visit. If applicable, a reminder letter will be sent to the patient regarding the next appointment. BI-RADS CATEGORY  2: Benign. Electronically Signed   By: Evangeline Dakin M.D.   On: 10/17/2014 15:26    Assessment & Plan:   Inice was seen today for acute visit.  Diagnoses and all orders for this visit:  Acute URI -     dextromethorphan-guaiFENesin (MUCINEX DM) 30-600 MG 12hr tablet; Take 1 tablet by mouth 2 (two) times daily as needed for cough. -     fluticasone (FLONASE) 50 MCG/ACT nasal spray; Place 2 sprays into both nostrils daily. -     methylPREDNISolone acetate (DEPO-MEDROL) injection 40 mg; Inject 1 mL (40 mg total) into the muscle once. -     DG Chest 2 View; Future -     HYDROcodone-homatropine (HYCODAN) 5-1.5 MG/5ML syrup; Take 5 mLs by mouth at bedtime as needed for cough.  Acute bronchitis, unspecified organism -     dextromethorphan-guaiFENesin [facility] DM)  30-600 MG 12hr tablet; Take 1 tablet by mouth 2 (two) times daily as needed for cough. -     fluticasone (FLONASE) 50 MCG/ACT nasal spray; Place 2 sprays into both nostrils daily. -     methylPREDNISolone acetate (DEPO-MEDROL) injection 40 mg; Inject 1 mL (40 mg total) into the muscle once. -     DG Chest 2 View; Future -     HYDROcodone-homatropine (HYCODAN) 5-1.5 MG/5ML syrup; Take 5 mLs by mouth at bedtime as needed for cough.   I am having Ms. Dado start on dextromethorphan-guaiFENesin, fluticasone, and HYDROcodone-homatropine. I am also having her maintain her montelukast, omeprazole, ALPRAZolam, buPROPion, sertraline, cetirizine, hydrocortisone, and hydrocortisone. We will continue to administer methylPREDNISolone acetate.  Meds ordered this encounter  Medications  . dextromethorphan-guaiFENesin (MUCINEX DM) 30-600 MG 12hr tablet    Sig: Take 1 tablet by mouth 2 (two) times daily as needed for cough.    Dispense:  14 tablet    Refill:  0    Order Specific Question:   Supervising Provider    Answer:    Cassandria Anger [[id number]]  . fluticasone (FLONASE) 50 MCG/ACT nasal spray    Sig: Place 2 sprays into both nostrils daily.    Dispense:  16 g    Refill:  0    Order Specific Question:   Supervising Provider    Answer:   Cassandria Anger [[id number]]  . methylPREDNISolone acetate (DEPO-MEDROL) injection 40 mg  . HYDROcodone-homatropine (HYCODAN) 5-1.5 MG/5ML syrup    Sig: Take 5 mLs by mouth at bedtime as needed for cough.    Dispense:  120 mL    Refill:  0    Order Specific Question:   Supervising Provider    Answer:   Cassandria Anger [[id number]]    Follow-up: No Follow-up on file.  Wilfred Lacy, NP

## 2017-03-23 ENCOUNTER — Encounter: Payer: Self-pay | Admitting: Internal Medicine

## 2017-03-23 ENCOUNTER — Ambulatory Visit (INDEPENDENT_AMBULATORY_CARE_PROVIDER_SITE_OTHER): Payer: 59 | Admitting: Internal Medicine

## 2017-03-23 ENCOUNTER — Other Ambulatory Visit (INDEPENDENT_AMBULATORY_CARE_PROVIDER_SITE_OTHER): Payer: 59

## 2017-03-23 VITALS — BP 122/86 | HR 92 | Temp 98.2°F | Resp 16 | Ht 67.0 in | Wt 172.0 lb

## 2017-03-23 DIAGNOSIS — Z0000 Encounter for general adult medical examination without abnormal findings: Secondary | ICD-10-CM

## 2017-03-23 DIAGNOSIS — R7303 Prediabetes: Secondary | ICD-10-CM

## 2017-03-23 DIAGNOSIS — L219 Seborrheic dermatitis, unspecified: Secondary | ICD-10-CM | POA: Diagnosis not present

## 2017-03-23 DIAGNOSIS — E7800 Pure hypercholesterolemia, unspecified: Secondary | ICD-10-CM

## 2017-03-23 DIAGNOSIS — K219 Gastro-esophageal reflux disease without esophagitis: Secondary | ICD-10-CM

## 2017-03-23 LAB — CBC WITH DIFFERENTIAL/PLATELET
Basophils Absolute: 0.1 10*3/uL (ref 0.0–0.1)
Basophils Relative: 0.7 % (ref 0.0–3.0)
Eosinophils Absolute: 0.1 10*3/uL (ref 0.0–0.7)
Eosinophils Relative: 1.6 % (ref 0.0–5.0)
HCT: 42.6 % (ref 36.0–46.0)
Hemoglobin: 14.3 g/dL (ref 12.0–15.0)
Lymphocytes Relative: 17 % (ref 12.0–46.0)
Lymphs Abs: 1.3 10*3/uL (ref 0.7–4.0)
MCHC: 33.6 g/dL (ref 30.0–36.0)
MCV: 84.7 fl (ref 78.0–100.0)
Monocytes Absolute: 0.6 10*3/uL (ref 0.1–1.0)
Monocytes Relative: 7.6 % (ref 3.0–12.0)
Neutro Abs: 5.6 10*3/uL (ref 1.4–7.7)
Neutrophils Relative %: 73.1 % (ref 43.0–77.0)
Platelets: 229 10*3/uL (ref 150.0–400.0)
RBC: 5.02 Mil/uL (ref 3.87–5.11)
RDW: 13.2 % (ref 11.5–15.5)
WBC: 7.7 10*3/uL (ref 4.0–10.5)

## 2017-03-23 LAB — LIPID PANEL
Cholesterol: 254 mg/dL — ABNORMAL HIGH (ref 0–200)
HDL: 42.1 mg/dL (ref >39.00)
LDL Cholesterol: 173 mg/dL — ABNORMAL HIGH (ref 0–99)
NonHDL: 212.36
Total CHOL/HDL Ratio: 6
Triglycerides: 195 mg/dL — ABNORMAL HIGH (ref 0.0–149.0)
VLDL: 39 mg/dL (ref 0.0–40.0)

## 2017-03-23 LAB — COMPREHENSIVE METABOLIC PANEL
ALT: 14 U/L (ref 0–35)
AST: 15 U/L (ref 0–37)
Albumin: 4.7 g/dL (ref 3.5–5.2)
Alkaline Phosphatase: 47 U/L (ref 39–117)
BUN: 16 mg/dL (ref 6–23)
CO2: 29 mEq/L (ref 19–32)
Calcium: 9.5 mg/dL (ref 8.4–10.5)
Chloride: 103 mEq/L (ref 96–112)
Creatinine, Ser: 1.05 mg/dL (ref 0.40–1.20)
GFR: 59.57 mL/min — ABNORMAL LOW (ref >60.00)
Glucose, Bld: 108 mg/dL — ABNORMAL HIGH (ref 70–99)
Potassium: 4.2 mEq/L (ref 3.5–5.1)
Sodium: 139 mEq/L (ref 135–145)
Total Bilirubin: 0.6 mg/dL (ref 0.2–1.2)
Total Protein: 7 g/dL (ref 6.0–8.3)

## 2017-03-23 LAB — HEMOGLOBIN A1C: Hgb A1c MFr Bld: 5.9 % (ref 4.6–6.5)

## 2017-03-23 LAB — TSH: TSH: 1.43 u[IU]/mL (ref 0.35–4.50)

## 2017-03-23 MED ORDER — RANITIDINE HCL 150 MG PO TABS: 150.0000 mg | ORAL_TABLET | Freq: Every day | ORAL | 3 refills | Status: DC

## 2017-03-23 MED ORDER — KETOCONAZOLE 2 % EX SHAM: 1.0000 "application " | MEDICATED_SHAMPOO | CUTANEOUS | 0 refills | Status: DC

## 2017-03-23 MED ORDER — PANTOPRAZOLE SODIUM 40 MG PO TBEC: 40.0000 mg | DELAYED_RELEASE_TABLET | Freq: Every day | ORAL | 5 refills | Status: DC

## 2017-03-23 MED ORDER — SUCRALFATE 1 GM/10ML PO SUSP: 1.0000 g | Freq: Three times a day (TID) | ORAL | 0 refills | Status: DC

## 2017-03-23 NOTE — Patient Instructions (Addendum)
Test(s) ordered today. Your results will be released to [place] (or called to you) after review, usually within 72hours after test completion. If any changes need to be made, you will be notified at that same time.  All other Health Maintenance issues reviewed.   All recommended immunizations and age-appropriate screenings are up-to-date or discussed.  No immunizations administered today.   Medications reviewed and updated.  Changes include starting protonix, zantac for your heartburn and carafate for your heartburn, which is temporary.  A shampoo was prescribed for your scalp.   Your prescription(s) have been submitted to your pharmacy. Please take as directed and contact our office if you believe you are having problem(s) with the medication(s).  A referral was ordered for GI for your heartburn.  Please followup in 6 months   Health Maintenance, Female Adopting a healthy lifestyle and getting preventive care can go a long way to promote health and wellness. Talk with your health care provider about what schedule of regular examinations is right for you. This is a good chance for you to check in with your provider about disease prevention and staying healthy. In between checkups, there are plenty of things you can do on your own. Experts have done a lot of research about which lifestyle changes and preventive measures are most likely to keep you healthy. Ask your health care provider for more information. Weight and diet Eat a healthy diet  Be sure to include plenty of vegetables, fruits, low-fat dairy products, and lean protein.  Do not eat a lot of foods high in solid fats, added sugars, or salt.  Get regular exercise. This is one of the most important things you can do for your health.  Most adults should exercise for at least 150 minutes each week. The exercise should increase your heart rate and make you sweat (moderate-intensity exercise).  Most adults should also do  strengthening exercises at least twice a week. This is in addition to the moderate-intensity exercise. Maintain a healthy weight  Body mass index (BMI) is a measurement that can be used to identify possible weight problems. It estimates body fat based on height and weight. Your health care provider can help determine your BMI and help you achieve or maintain a healthy weight.  For females 65 years of age and older:  A BMI below 18.5 is considered underweight.  A BMI of 18.5 to 24.9 is normal.  A BMI of 25 to 29.9 is considered overweight.  A BMI of 30 and above is considered obese. Watch levels of cholesterol and blood lipids  You should start having your blood tested for lipids and cholesterol at 48 years of age, then have this test every 5 years.  You may need to have your cholesterol levels checked more often if:  Your lipid or cholesterol levels are high.  You are older than 48 years of age.  You are at high risk for heart disease. Cancer screening Lung Cancer  Lung cancer screening is recommended for adults 74-46 years old who are at high risk for lung cancer because of a history of smoking.  A yearly low-dose CT scan of the lungs is recommended for people who:  Currently smoke.  Have quit within the past 15 years.  Have at least a 30-pack-year history of smoking. A pack year is smoking an average of one pack of cigarettes a day for 1 year.  Yearly screening should continue until it has been 15 years since you quit.  Yearly  screening should stop if you develop a health problem that would prevent you from having lung cancer treatment. Breast Cancer  Practice breast self-awareness. This means understanding how your breasts normally appear and feel.  It also means doing regular breast self-exams. Let your health care provider know about any changes, no matter how small.  If you are in your 20s or 30s, you should have a clinical breast exam (CBE) by a health care  provider every 1-3 years as part of a regular health exam.  If you are 73 or older, have a CBE every year. Also consider having a breast X-ray (mammogram) every year.  If you have a family history of breast cancer, talk to your health care provider about genetic screening.  If you are at high risk for breast cancer, talk to your health care provider about having an MRI and a mammogram every year.  Breast cancer gene (BRCA) assessment is recommended for women who have family members with BRCA-related cancers. BRCA-related cancers include:  Breast.  Ovarian.  Tubal.  Peritoneal cancers.  Results of the assessment will determine the need for genetic counseling and BRCA1 and BRCA2 testing. Cervical Cancer  Your health care provider may recommend that you be screened regularly for cancer of the pelvic organs (ovaries, uterus, and vagina). This screening involves a pelvic examination, including checking for microscopic changes to the surface of your cervix (Pap test). You may be encouraged to have this screening done every 3 years, beginning at age 79.  For women ages 34-65, health care providers may recommend pelvic exams and Pap testing every 3 years, or they may recommend the Pap and pelvic exam, combined with testing for human papilloma virus (HPV), every 5 years. Some types of HPV increase your risk of cervical cancer. Testing for HPV may also be done on women of any age with unclear Pap test results.  Other health care providers may not recommend any screening for nonpregnant women who are considered low risk for pelvic cancer and who do not have symptoms. Ask your health care provider if a screening pelvic exam is right for you.  If you have had past treatment for cervical cancer or a condition that could lead to cancer, you need Pap tests and screening for cancer for at least 20 years after your treatment. If Pap tests have been discontinued, your risk factors (such as having a new sexual  partner) need to be reassessed to determine if screening should resume. Some women have medical problems that increase the chance of getting cervical cancer. In these cases, your health care provider may recommend more frequent screening and Pap tests. Colorectal Cancer  This type of cancer can be detected and often prevented.  Routine colorectal cancer screening usually begins at 48 years of age and continues through 48 years of age.  Your health care provider may recommend screening at an earlier age if you have risk factors for colon cancer.  Your health care provider may also recommend using home test kits to check for hidden blood in the stool.  A small camera at the end of a tube can be used to examine your colon directly (sigmoidoscopy or colonoscopy). This is done to check for the earliest forms of colorectal cancer.  Routine screening usually begins at age 62.  Direct examination of the colon should be repeated every 5-10 years through 48 years of age. However, you may need to be screened more often if early forms of precancerous polyps or small  growths are found. Skin Cancer  Check your skin from head to toe regularly.  Tell your health care provider about any new moles or changes in moles, especially if there is a change in a mole's shape or color.  Also tell your health care provider if you have a mole that is larger than the size of a pencil eraser.  Always use sunscreen. Apply sunscreen liberally and repeatedly throughout the day.  Protect yourself by wearing long sleeves, pants, a wide-brimmed hat, and sunglasses whenever you are outside. Heart disease, diabetes, and high blood pressure  High blood pressure causes heart disease and increases the risk of stroke. High blood pressure is more likely to develop in:  People who have blood pressure in the high end of the normal range (130-139/85-89 mm Hg).  People who are overweight or obese.  People who are African  American.  If you are 74-4 years of age, have your blood pressure checked every 3-5 years. If you are 41 years of age or older, have your blood pressure checked every year. You should have your blood pressure measured twice-once when you are at a hospital or clinic, and once when you are not at a hospital or clinic. Record the average of the two measurements. To check your blood pressure when you are not at a hospital or clinic, you can use:  An automated blood pressure machine at a pharmacy.  A home blood pressure monitor.  If you are between 82 years and 39 years old, ask your health care provider if you should take aspirin to prevent strokes.  Have regular diabetes screenings. This involves taking a blood sample to check your fasting blood sugar level.  If you are at a normal weight and have a low risk for diabetes, have this test once every three years after 48 years of age.  If you are overweight and have a high risk for diabetes, consider being tested at a younger age or more often. Preventing infection Hepatitis B  If you have a higher risk for hepatitis B, you should be screened for this virus. You are considered at high risk for hepatitis B if:  You were born in a country where hepatitis B is common. Ask your health care provider which countries are considered high risk.  Your parents were born in a high-risk country, and you have not been immunized against hepatitis B (hepatitis B vaccine).  You have HIV or AIDS.  You use needles to inject street drugs.  You live with someone who has hepatitis B.  You have had sex with someone who has hepatitis B.  You get hemodialysis treatment.  You take certain medicines for conditions, including cancer, organ transplantation, and autoimmune conditions. Hepatitis C  Blood testing is recommended for:  Everyone born from [birth year] through 1967.  Anyone with known risk factors for hepatitis C. Sexually transmitted infections  (STIs)  You should be screened for sexually transmitted infections (STIs) including gonorrhea and chlamydia if:  You are sexually active and are younger than 48 years of age.  You are older than 48 years of age and your health care provider tells you that you are at risk for this type of infection.  Your sexual activity has changed since you were last screened and you are at an increased risk for chlamydia or gonorrhea. Ask your health care provider if you are at risk.  If you do not have HIV, but are at risk, it may be recommended that you  take a prescription medicine daily to prevent HIV infection. This is called pre-exposure prophylaxis (PrEP). You are considered at risk if:  You are sexually active and do not regularly use condoms or know the HIV status of your partner(s).  You take drugs by injection.  You are sexually active with a partner who has HIV. Talk with your health care provider about whether you are at high risk of being infected with HIV. If you choose to begin PrEP, you should first be tested for HIV. You should then be tested every 3 months for as long as you are taking PrEP. Pregnancy  If you are premenopausal and you may become pregnant, ask your health care provider about preconception counseling.  If you may become pregnant, take 400 to 800 micrograms (mcg) of folic acid every day.  If you want to prevent pregnancy, talk to your health care provider about birth control (contraception). Osteoporosis and menopause  Osteoporosis is a disease in which the bones lose minerals and strength with aging. This can result in serious bone fractures. Your risk for osteoporosis can be identified using a bone density scan.  If you are 4 years of age or older, or if you are at risk for osteoporosis and fractures, ask your health care provider if you should be screened.  Ask your health care provider whether you should take a calcium or vitamin D supplement to lower your risk  for osteoporosis.  Menopause may have certain physical symptoms and risks.  Hormone replacement therapy may reduce some of these symptoms and risks. Talk to your health care provider about whether hormone replacement therapy is right for you. Follow these instructions at home:  Schedule regular health, dental, and eye exams.  Stay current with your immunizations.  Do not use any tobacco products including cigarettes, chewing tobacco, or electronic cigarettes.  If you are pregnant, do not drink alcohol.  If you are breastfeeding, limit how much and how often you drink alcohol.  Limit alcohol intake to no more than 1 drink per day for nonpregnant women. One drink equals 12 ounces of beer, 5 ounces of wine, or 1 ounces of hard liquor.  Do not use street drugs.  Do not share needles.  Ask your health care provider for help if you need support or information about quitting drugs.  Tell your health care provider if you often feel depressed.  Tell your health care provider if you have ever been abused or do not feel safe at home. This information is not intended to replace advice given to you by your health care provider. Make sure you discuss any questions you have with your health care provider. Document Released: 05/02/2011 Document Revised: 03/24/2016 Document Reviewed: 07/21/2015 Elsevier Interactive Patient Education  2017 [employer].

## 2017-03-23 NOTE — Assessment & Plan Note (Signed)
Has daily sore throat, hoarseness and globus sensation with GERD Did not tolerate omeprazole - caused bladder discomfort Start protonix - advised if this does not help of she does not tolerate it call so we can change medication Start zantac at bedtime Stressed GERD diet

## 2017-03-23 NOTE — Progress Notes (Signed)
Subjective:    Patient ID: Pamela Novak, female    DOB: 10-Oct-1971, 46 y.o.   MRN: [id number]  HPI She is here for a physical exam.   Recurrent sore throats, Hoarseness, global sensation:  When she took omeprazole she was having a feeling of glass in her bladder.  She stopped taking it and takes tums.  I referred her to GI last year and she did not go.  She has not let me know her GERD was not controlled.  She has daily symptoms that are severe.    She has been walking some for exercise, but not as much as she should.  Not doing great with diet.  No always compliant with a diabetic diet.  She drinks sweet tea, but only one glass a day.   Medications and allergies reviewed with patient and updated if appropriate.  Patient Active Problem List   Diagnosis Date Noted  . Prediabetes 03/23/2017  . Seborrheic dermatitis of scalp 03/23/2017  . Hyperlipidemia 01/21/2016  . Internal and external hemorrhoids without complication 46/56/8127  . Depression 01/18/2016  . PTSD (post-traumatic stress disorder) 01/18/2016  . GERD (gastroesophageal reflux disease) 01/18/2016  . Recurrent tonsillitis 10/08/2014  . Varicose veins 05/21/2013  . Generalized skin lesions 05/21/2013  . GAD (generalized anxiety disorder) 11/02/2012  . Lipoma of skin and subcutaneous tissue of neck 09/26/2012    Current Outpatient Prescriptions on File Prior to Visit  Medication Sig Dispense Refill  . ALPRAZolam (XANAX) 0.25 MG tablet Take 1 tablet (0.25 mg total) by mouth 2 (two) times daily as needed. 60 tablet 0  . buPROPion (WELLBUTRIN XL) 300 MG 24 hr tablet Take 300 mg by mouth daily.    . sertraline (ZOLOFT) 50 MG tablet Take 50 mg by mouth daily. Take 1.5 tablets twice daily.     No current facility-administered medications on file prior to visit.     Past Medical History:  Diagnosis Date  . Allergy   . Anxiety   . Blood in stool   . Breast hematoma   . Complication of anesthesia    "woke up during  colonoscopy"  . Depression   . GERD (gastroesophageal reflux disease)   . Hyperlipidemia   . Traumatic hematoma of knee     Past Surgical History:  Procedure Laterality Date  . ABLATION ON ENDOMETRIOSIS  10/04/2012   Procedure: ABLATION ON ENDOMETRIOSIS;  Surgeon: Linda Hedges, DO;  Location: [facility];  Service: Gynecology;  Laterality: N/A;  . COLONOSCOPY    . CONDYLOMA EXCISION/FULGURATION  10/04/2012   Procedure: CONDYLOMA REMOVAL;  Surgeon: Linda Hedges, DO;  Location: [facility];  Service: Gynecology;  Laterality: N/A;  . DILITATION & CURRETTAGE/HYSTROSCOPY WITH NOVASURE ABLATION  10/04/2012   Procedure: DILATATION & CURETTAGE/HYSTEROSCOPY WITH NOVASURE ABLATION;  Surgeon: Linda Hedges, DO;  Location: [facility];  Service: Gynecology;  Laterality: N/A;  . LAPAROSCOPY  10/04/2012   Procedure: LAPAROSCOPY OPERATIVE;  Surgeon: Linda Hedges, DO;  Location: [facility];  Service: Gynecology;  Laterality: N/A;    Social History   Social History  . Marital status: Married    Spouse name: N/A  . Number of children: 2  . Years of education: 12+   Occupational History  . adm officer [employer]   Social History Main Topics  . Smoking status: Never Smoker  . Smokeless tobacco: Never Used  . Alcohol use Yes     Comment: rare  . Drug use: No  . Sexual activity: Yes  Other Topics Concern  . None   Social History Narrative   Regular exercise-no   Caffeine Use-yes    Family History  Problem Relation Age of Onset  . Heart disease Mother   . Arthritis Mother   . Hyperlipidemia Mother   . Hypertension Mother   . Heart attack Mother   . Cancer Father        Colon Cancer-Polyps  . Hypertension Father   . Hashimoto's thyroiditis Sister   . Diabetes Sister   . Thyroid disease Sister   . Cancer Paternal Grandfather        Colon Cancer  . Hashimoto's thyroiditis Sister   . Thyroid disease Sister     Review of Systems  Constitutional: Negative for chills and fever.  HENT:  Positive for sore throat, trouble swallowing and voice change.        Clears throat  Eyes: Positive for visual disturbance (occ blurry vision).  Respiratory: Negative for cough, shortness of breath and wheezing.   Cardiovascular: Positive for chest pain (gerd related).  Gastrointestinal: Positive for constipation and nausea. Negative for abdominal pain, anal bleeding (hemorrhoids), blood in stool and diarrhea.       Daily GERD  Genitourinary: Negative for dysuria and hematuria.  Musculoskeletal: Positive for back pain (occ) and neck pain (occ). Negative for arthralgias.  Skin: Negative for color change and rash.       Dry itchy scalp  Neurological: Positive for light-headedness (rare) and headaches (migraines).  Psychiatric/Behavioral: Positive for dysphoric mood. The patient is nervous/anxious.        Objective:   Vitals:   03/23/17 0927  BP: 122/86  Pulse: 92  Resp: 16  Temp: 98.2 F (36.8 C)   Filed Weights   03/23/17 0927  Weight: 172 lb (78 kg)   Body mass index is 26.94 kg/m.  Wt Readings from Last 3 Encounters:  03/23/17 172 lb (78 kg)  10/28/16 175 lb (79.4 kg)  01/18/16 174 lb (78.9 kg)     Physical Exam Constitutional: She appears well-developed and well-nourished. No distress.  HENT:  Head: Normocephalic and atraumatic.  Right Ear: External ear normal. Normal ear canal and TM Left Ear: External ear normal.  Normal ear canal and TM Mouth/Throat: Oropharynx is clear and moist.  Eyes: Conjunctivae and EOM are normal.  Neck: Neck supple. No tracheal deviation present. No thyromegaly present.  No carotid bruit  Cardiovascular: Normal rate, regular rhythm and normal heart sounds.   No murmur heard.  No edema. Pulmonary/Chest: Effort normal and breath sounds normal. No respiratory distress. She has no wheezes. She has no rales.  Breast: deferred to Gyn Abdominal: Soft. She exhibits no distension. There is no tenderness.  Lymphadenopathy: She has no cervical  adenopathy.  Skin: Skin is warm and dry. She is not diaphoretic. no scalp dryness or redness Psychiatric: She has a normal mood and affect. Her behavior is normal.         Assessment & Plan:   Physical exam: Screening blood work ordered Immunizations  Up to date  Mammogram  Up to date per patient Colonoscopy -  Up to date  Gyn  -  Due - will schedule Eye exams - Up to date  Exercise - walking -  Work on increasing walking Weight  Advised weight loss Skin  - no concerns, mild acne Substance abuse  none  See Problem List for Assessment and Plan of chronic medical problems.  FU in 6 months

## 2017-03-23 NOTE — Assessment & Plan Note (Signed)
Check a1c Low sugar / carb diet Stressed regular exercise, weight loss  

## 2017-03-23 NOTE — Assessment & Plan Note (Signed)
- 

## 2017-03-23 NOTE — Assessment & Plan Note (Signed)
Check lipids 

## 2017-03-29 ENCOUNTER — Encounter: Payer: Self-pay | Admitting: Internal Medicine

## 2017-03-29 DIAGNOSIS — E7800 Pure hypercholesterolemia, unspecified: Secondary | ICD-10-CM

## 2017-04-03 ENCOUNTER — Encounter: Payer: Self-pay | Admitting: Gastroenterology

## 2017-05-01 ENCOUNTER — Encounter: Payer: Self-pay | Admitting: Internal Medicine

## 2017-05-09 ENCOUNTER — Ambulatory Visit (INDEPENDENT_AMBULATORY_CARE_PROVIDER_SITE_OTHER): Payer: 59 | Admitting: Gastroenterology

## 2017-05-09 ENCOUNTER — Encounter: Payer: Self-pay | Admitting: Gastroenterology

## 2017-05-09 VITALS — BP 110/64 | HR 68 | Ht 67.0 in | Wt 166.0 lb

## 2017-05-09 DIAGNOSIS — K219 Gastro-esophageal reflux disease without esophagitis: Secondary | ICD-10-CM | POA: Diagnosis not present

## 2017-05-09 DIAGNOSIS — R1310 Dysphagia, unspecified: Secondary | ICD-10-CM

## 2017-05-09 DIAGNOSIS — R198 Other specified symptoms and signs involving the digestive system and abdomen: Secondary | ICD-10-CM

## 2017-05-09 DIAGNOSIS — R109 Unspecified abdominal pain: Secondary | ICD-10-CM

## 2017-05-09 DIAGNOSIS — F458 Other somatoform disorders: Secondary | ICD-10-CM

## 2017-05-09 DIAGNOSIS — K581 Irritable bowel syndrome with constipation: Secondary | ICD-10-CM

## 2017-05-09 DIAGNOSIS — R0989 Other specified symptoms and signs involving the circulatory and respiratory systems: Secondary | ICD-10-CM

## 2017-05-09 MED ORDER — LINACLOTIDE 72 MCG PO CAPS: 72.0000 ug | ORAL_CAPSULE | Freq: Every day | ORAL | 3 refills | Status: DC

## 2017-05-09 NOTE — Progress Notes (Addendum)
Pamela Novak    [id number]    May 14, 1971  Primary Care Physician:Burns, Claudina Lick, MD  Referring Physician: [name], MD [place], [place] [address]  Chief complaint: GERD, abdominal cramps, constipation alternating with diarrhea  HPI: 46 year old female previously followed by Dr.Orr in 2008 and Dr. Watt Climes, [facility] in 2013 is here for new patient visit.  She has had chronic GI problems for past 15-20 years.  Constipation alternating with diarrhea She takes OTC laxatives intermittently Tried Miralax caused cramps  History of chronic GERD, globus sensation, cough so throat and vocal cord dysfunction  She feels omeprazole was helping with reactive airway and cough but caused abdominal cramps , she is currently on Protonix as she felt after sometime omeprazole stopped working, he continues to have intermittent lower abdominal cramps.  Never had a EGD  Colonoscopy 5 years ago by Dr. Watt Climes was unremarkable.  Father and sister had multiple polyps, but no history of colon cancer   Outpatient Encounter Prescriptions as of 05/09/2017  Medication Sig  . ALPRAZolam (XANAX) 0.25 MG tablet Take 1 tablet (0.25 mg total) by mouth 2 (two) times daily as needed.  [name] Kitchen buPROPion (WELLBUTRIN XL) 300 MG 24 hr tablet Take 300 mg by mouth daily.  [name] Kitchen ketoconazole (NIZORAL) 2 % shampoo Apply 1 application topically 2 (two) times a week.  . pantoprazole (PROTONIX) 40 MG tablet Take 1 tablet (40 mg total) by mouth daily.  . ranitidine (ZANTAC) 150 MG tablet Take 1 tablet (150 mg total) by mouth at bedtime.  . sertraline (ZOLOFT) 50 MG tablet Take 50 mg by mouth daily. Take 1.5 tablets twice daily.  . sucralfate (CARAFATE) 1 GM/10ML suspension Take 10 mLs (1 g total) by mouth 4 (four) times daily -  with meals and at bedtime.   No facility-administered encounter medications on file as of 05/09/2017.     Allergies as of 05/09/2017 - Review Complete 05/09/2017  Allergen  Reaction Noted  . Capsaicin Cough 01/18/2016  . Flagyl [metronidazole] Rash 09/11/2012    Past Medical History:  Diagnosis Date  . Allergy   . Anxiety   . Blood in stool   . Breast hematoma   . Complication of anesthesia    "woke up during colonoscopy"  . Depression   . GERD (gastroesophageal reflux disease)   . Hyperlipidemia   . Traumatic hematoma of knee     Past Surgical History:  Procedure Laterality Date  . ABLATION ON ENDOMETRIOSIS  10/04/2012   Procedure: ABLATION ON ENDOMETRIOSIS;  Surgeon: Linda Hedges, DO;  Location: [facility];  Service: Gynecology;  Laterality: N/A;  . COLONOSCOPY    . CONDYLOMA EXCISION/FULGURATION  10/04/2012   Procedure: CONDYLOMA REMOVAL;  Surgeon: Linda Hedges, DO;  Location: [facility];  Service: Gynecology;  Laterality: N/A;  . DILITATION & CURRETTAGE/HYSTROSCOPY WITH NOVASURE ABLATION  10/04/2012   Procedure: DILATATION & CURETTAGE/HYSTEROSCOPY WITH NOVASURE ABLATION;  Surgeon: Linda Hedges, DO;  Location: [facility];  Service: Gynecology;  Laterality: N/A;  . LAPAROSCOPY  10/04/2012   Procedure: LAPAROSCOPY OPERATIVE;  Surgeon: Linda Hedges, DO;  Location: [facility];  Service: Gynecology;  Laterality: N/A;    Family History  Problem Relation Age of Onset  . Heart disease Mother   . Arthritis Mother   . Hyperlipidemia Mother   . Hypertension Mother   . Heart attack Mother   . Cancer Father        Colon Cancer-Polyps  .  Hypertension Father   . Hashimoto's thyroiditis Sister   . Diabetes Sister   . Thyroid disease Sister   . Cancer Paternal Grandfather        Colon Cancer  . Hashimoto's thyroiditis Sister   . Thyroid disease Sister     Social History   Social History  . Marital status: Married    Spouse name: N/A  . Number of children: 2  . Years of education: 12+   Occupational History  . adm officer [employer]   Social History Main Topics  . Smoking status: Never Smoker  . Smokeless tobacco: Never Used  . Alcohol use Yes       Comment: rare  . Drug use: No  . Sexual activity: Yes   Other Topics Concern  . Not on file   Social History Narrative   Regular exercise-no   Caffeine Use-yes      Review of systems: Review of Systems  Constitutional: Negative for fever and chills.  HENT: Negative.   Eyes: Negative for blurred vision.  Respiratory: Negative for cough, shortness of breath and wheezing.   Cardiovascular: Negative for chest pain and palpitations.  Gastrointestinal: as per HPI Genitourinary: Negative for dysuria, urgency, frequency and hematuria.  Musculoskeletal: Negative for myalgias, back pain and joint pain.  Skin: Negative for itching and rash.  Neurological: Negative for dizziness, tremors, focal weakness, seizures and loss of consciousness.  Endo/Heme/Allergies: Positive for seasonal allergies.  Psychiatric/Behavioral: Negative for depression, suicidal ideas and hallucinations.  All other systems reviewed and are negative.   Physical Exam: Vitals:   05/09/17 1050  BP: 110/64  Pulse: 68   Body mass index is 26 kg/m. Gen:      No acute distress HEENT:  EOMI, sclera anicteric Neck:     No masses; no thyromegaly Lungs:    Clear to auscultation bilaterally; normal respiratory effort CV:         Regular rate and rhythm; no murmurs Abd:      + bowel sounds; soft, non-tender; no palpable masses, no distension Ext:    No edema; adequate peripheral perfusion Skin:      Warm and dry; no rash Neuro: alert and oriented x 3 Psych: normal mood and affect  Data Reviewed:  Reviewed labs, radiology imaging, old records and pertinent past GI work up   Assessment and Plan/Recommendations:  46 year old female previously followed by Dr. Lajoyce Corners and Dr. Watt Climes at [facility] for evaluation  GERD, globus sensation, regurgitation, will dysphagia despite PPI Schedule for EGD for evaluation Continue Protonix 40 mg daily, 30 minutes before breakfast and add Zantac 150 mg at bedtime  Irritable  bowel syndrome predominant constipation alternating with diarrhea: Start Linzess 72 g daily Increase the fiber and fluid intake  Lower abdominal cramps: Trial of peppermint oil 1 capsule 3 times daily as needed  Colorectal cancer screening: Average risk last colonoscopy in 2013 No family history of colon cancer but patient thinks her sister and her father had multiple polyps removed   Damaris Hippo , MD [phone] Mon-Fri 8a-5p [phone] after 5p, weekends, holidays  CC: [name], MD   Addendum: Received records from [facility]  Colonoscopy August 17, 2012 internal/external hemorrhoids, sigmoid diverticulosis, colon appeared normal biopsies were obtained to exclude microscopic colitis, terminal ileum appeared normal biopsies were obtained. Path report benign small bowel mucosa benign colonic mucosa

## 2017-05-09 NOTE — Patient Instructions (Addendum)
You have been scheduled for an endoscopy. Please follow written instructions given to you at your visit today. If you use inhalers (even only as needed), please bring them with you on the day of your procedure. Your physician has requested that you go to www.startemmi.com and enter the access code given to you at your visit today. This web site gives a general overview about your procedure. However, you should still follow specific instructions given to you by our office regarding your preparation for the procedure.  We have sent Linzess to your pharmacy  Use over the counter IB Gard 1-2 capsules three times a day as needed  Continue Protonix and Zantac   If you find out you have a family history of colon cancer in your sister, contact the office to schedule your colonoscopy, you will be scheduled for a pre visit/Preop with a nurse to get all your instructions and sign paperwork.

## 2017-05-10 ENCOUNTER — Ambulatory Visit (AMBULATORY_SURGERY_CENTER): Payer: 59 | Admitting: Gastroenterology

## 2017-05-10 ENCOUNTER — Encounter: Payer: Self-pay | Admitting: Gastroenterology

## 2017-05-10 VITALS — BP 102/67 | HR 84 | Temp 98.9°F | Resp 21 | Ht 67.0 in | Wt 166.0 lb

## 2017-05-10 DIAGNOSIS — K219 Gastro-esophageal reflux disease without esophagitis: Secondary | ICD-10-CM | POA: Diagnosis not present

## 2017-05-10 MED ORDER — SODIUM CHLORIDE 0.9 % IV SOLN: 500.0000 mL | INTRAVENOUS | Status: DC

## 2017-05-10 NOTE — Progress Notes (Signed)
Dental advisory given to patientAlert and oriented x3, pleased with MAC, report to RN jane 

## 2017-05-10 NOTE — Progress Notes (Signed)
Called to room to assist during endoscopic procedure.  Patient ID and intended procedure confirmed with present staff. Received instructions for my participation in the procedure from the performing physician.  

## 2017-05-10 NOTE — Patient Instructions (Signed)
YOU HAD AN ENDOSCOPIC PROCEDURE TODAY AT [facility]:   Refer to the procedure report that was given to you for any specific questions about what was found during the examination.  If the procedure report does not answer your questions, please call your gastroenterologist to clarify.  If you requested that your care partner not be given the details of your procedure findings, then the procedure report has been included in a sealed envelope for you to review at your convenience later.  YOU SHOULD EXPECT: Some feelings of bloating in the abdomen. Passage of more gas than usual.  Walking can help get rid of the air that was put into your GI tract during the procedure and reduce the bloating. If you had a lower endoscopy (such as a colonoscopy or flexible sigmoidoscopy) you may notice spotting of blood in your stool or on the toilet paper. If you underwent a bowel prep for your procedure, you may not have a normal bowel movement for a few days.  Please Note:  You might notice some irritation and congestion in your nose or some drainage.  This is from the oxygen used during your procedure.  There is no need for concern and it should clear up in a day or so.  SYMPTOMS TO REPORT IMMEDIATELY:    Following upper endoscopy (EGD)  Vomiting of blood or coffee ground material  New chest pain or pain under the shoulder blades  Painful or persistently difficult swallowing  New shortness of breath  Fever of 100F or higher  Black, tarry-looking stools  For urgent or emergent issues, a gastroenterologist can be reached at any hour by calling [phone].   DIET:  We do recommend a small meal at first, but then you may proceed to your regular diet.  Drink plenty of fluids but you should avoid alcoholic beverages for 24 hours.  ACTIVITY:  You should plan to take it easy for the rest of today and you should NOT DRIVE or use heavy machinery until tomorrow (because of the sedation medicines used  during the test).    FOLLOW UP: Our staff will call the number listed on your records the next business day following your procedure to check on you and address any questions or concerns that you may have regarding the information given to you following your procedure. If we do not reach you, we will leave a message.  However, if you are feeling well and you are not experiencing any problems, there is no need to return our call.  We will assume that you have returned to your regular daily activities without incident.  If any biopsies were taken you will be contacted by phone or by letter within the next 1-3 weeks.  Please call us at [phone] if you have not heard about the biopsies in 3 weeks.    SIGNATURES/CONFIDENTIALITY: You and/or your care partner have signed paperwork which will be entered into your electronic medical record.  These signatures attest to the fact that that the information above on your After Visit Summary has been reviewed and is understood.  Full responsibility of the confidentiality of this discharge information lies with you and/or your care-partner.  Esophagitis information given.

## 2017-05-10 NOTE — Op Note (Addendum)
[facility] Patient Name: Pamela Novak Procedure Date: 05/10/2017 10:11 AM MRN: [id number] Endoscopist: Mauri Pole , MD Age: 46 Referring MD:  Date of Birth: [birth year] Gender: Female Account #: [id number] Procedure:                Upper GI endoscopy Indications:              Esophageal reflux symptoms that recur despite                            appropriate therapy, Epigastric abdominal pain,                            Dyspepsia Medicines:                Monitored Anesthesia Care Procedure:                Pre-Anesthesia Assessment:                           - Prior to the procedure, a History and Physical                            was performed, and patient medications and                            allergies were reviewed. The patient's tolerance of                            previous anesthesia was also reviewed. The risks                            and benefits of the procedure and the sedation                            options and risks were discussed with the patient.                            All questions were answered, and informed consent                            was obtained. Prior Anticoagulants: The patient has                            taken no previous anticoagulant or antiplatelet                            agents. ASA Grade Assessment: II - A patient with                            mild systemic disease. After reviewing the risks                            and benefits, the patient was deemed in  satisfactory condition to undergo the procedure.                           After obtaining informed consent, the endoscope was                            passed under direct vision. Throughout the                            procedure, the patient's blood pressure, pulse, and                            oxygen saturations were monitored continuously. The                            Endoscope was introduced through the mouth,  and                            advanced to the second part of duodenum. The upper                            GI endoscopy was accomplished without difficulty.                            The patient tolerated the procedure well. Scope In: Scope Out: Findings:                 LA Grade A (one or more mucosal breaks less than 5                            mm, not extending between tops of 2 mucosal folds)                            esophagitis with no bleeding was found 25 to 35 cm                            from the incisors. Biopsies were obtained from the                            proximal and distal esophagus with cold forceps for                            histology of suspected eosinophilic esophagitis.                            Small hiatal hernia.                           A single 12 mm mucosal papule (nodule) with no                            stigmata of recent bleeding was found in the  gastric antrum. Biopsies were taken with a cold                            forceps for histology.                           Patchy mildly erythematous mucosa without bleeding                            was found in the entire examined stomach. Biopsies                            were taken with a cold forceps for Helicobacter                            pylori testing.                           The examined duodenum was normal. Complications:            No immediate complications. Estimated Blood Loss:     Estimated blood loss was minimal. Impression:               - LA Grade A reflux esophagitis. Biopsied.                           - A single mucosal papule (nodule) found in the                            stomach. Biopsied.                           - Erythematous mucosa in the stomach. Biopsied.                           - Normal examined duodenum. Recommendation:           - Patient has a contact number available for                            emergencies. The signs  and symptoms of potential                            delayed complications were discussed with the                            patient. Return to normal activities tomorrow.                            Written discharge instructions were provided to the                            patient.                           - Resume previous diet.                           -  Continue present medications.                           - Await pathology results.                           - Repeat upper endoscopy after studies are complete                            for surveillance based on pathology results. Mauri Pole, MD 05/10/2017 10:33:59 AM This report has been signed electronically.

## 2017-05-11 ENCOUNTER — Telehealth: Payer: Self-pay | Admitting: *Deleted

## 2017-05-11 NOTE — Telephone Encounter (Signed)
  Follow up Call-  Call back number 05/10/2017  Post procedure Call Back phone  # [phone]  Permission to leave phone message Yes  Some recent data might be hidden     Patient questions:  Do you have a fever, pain , or abdominal swelling? Yes.   Pain Score  4 *and 7 when swallowing this am  Have you tolerated food without any problems? No   Have you been able to return to your normal activities? Yes.    Do you have any questions about your discharge instructions: Diet   No. Medications  No. Follow up visit  No.  Do you have questions or concerns about your Care? Yes.  Patient reports that she is still having soreness and pain in her throat when she swallows, 7/10. The carafate and tylenol are helping to keep this pain under control. Instructed the patient to continue today with the soft diet recommended by Dr. Silverio Decamp yesterday and the tylenol and carafate. She is to notify us tomorrow if no further improvement. SM  Actions: * If pain score is 4 or above: Physician/ provider Notified : Harl Bowie, MD.

## 2017-05-11 NOTE — Telephone Encounter (Signed)
ok 

## 2017-05-12 ENCOUNTER — Telehealth: Payer: Self-pay | Admitting: Gastroenterology

## 2017-05-12 ENCOUNTER — Other Ambulatory Visit: Payer: Self-pay

## 2017-05-12 MED ORDER — LIDOCAINE VISCOUS 2 % MT SOLN: OROMUCOSAL | 0 refills | Status: DC

## 2017-05-12 NOTE — Telephone Encounter (Signed)
She continues to have significantly painful swallowing. The pain occurs with natural swallowing as well as with liquids and soft foods. Confirmed her medications and how she is taking them. She feels fatigued and attributes this to her discomfort. She is taking Tylenol for her pain. Patient says it is okay to leave her a message. [facility].

## 2017-05-12 NOTE — Telephone Encounter (Signed)
Patient notified and agrees to this plan. She will call if her symptoms get worse.

## 2017-05-12 NOTE — Telephone Encounter (Signed)
Viscous lidocaine  10cc Swish and swallow TID as needed. She had globus sensation and difficulty swallowing even prior to the procedure.

## 2017-05-18 ENCOUNTER — Encounter: Payer: Self-pay | Admitting: Gastroenterology

## 2017-05-19 ENCOUNTER — Encounter: Payer: Self-pay | Admitting: Gastroenterology

## 2017-05-29 ENCOUNTER — Other Ambulatory Visit: Payer: Self-pay

## 2017-05-29 MED ORDER — SUCRALFATE 1 GM/10ML PO SUSP: 1.0000 g | Freq: Three times a day (TID) | ORAL | 0 refills | Status: DC

## 2017-05-29 MED ORDER — BACLOFEN 10 MG PO TABS: 5.0000 mg | ORAL_TABLET | Freq: Every day | ORAL | 0 refills | Status: DC

## 2017-05-30 ENCOUNTER — Telehealth: Payer: Self-pay | Admitting: Gastroenterology

## 2017-05-30 NOTE — Telephone Encounter (Signed)
Called pharmacy to have RX insurance information faxed over so I can do a Prior Auth on Linzess

## 2017-06-15 NOTE — Telephone Encounter (Signed)
Patient approved for Linzess until 06/12/2018  [id number]  Approval sent to be scanned in

## 2017-06-25 ENCOUNTER — Other Ambulatory Visit: Payer: Self-pay | Admitting: Gastroenterology

## 2017-06-29 ENCOUNTER — Other Ambulatory Visit: Payer: Self-pay | Admitting: Internal Medicine

## 2017-06-29 ENCOUNTER — Telehealth: Payer: Self-pay | Admitting: Internal Medicine

## 2017-06-29 MED ORDER — KETOCONAZOLE 2 % EX SHAM: 1.0000 "application " | MEDICATED_SHAMPOO | CUTANEOUS | 0 refills | Status: DC

## 2017-06-29 NOTE — Telephone Encounter (Signed)
LVM for pt to call back and verify mail order pharmacy. [facility] card [facility]. [facility]

## 2017-06-29 NOTE — Telephone Encounter (Signed)
Pt called in and would like a 90 day supply of the ketoconazole (NIZORAL) 2 % shampoo [[id number]  sent to [facility]

## 2017-06-30 MED ORDER — KETOCONAZOLE 2 % EX SHAM: 1.0000 "application " | MEDICATED_SHAMPOO | CUTANEOUS | 0 refills | Status: DC

## 2017-06-30 NOTE — Telephone Encounter (Signed)
Patient states insurance has changed. Does not use [facility].  Insurance is still with [facility].  But scripts now go through [facility].  RN BIN [id number], [id number], Group, [facility]   Primary needs to be updated as [facility] for all 3 month scripts [facility].   Remove any other pharmacies listed.

## 2017-06-30 NOTE — Telephone Encounter (Signed)
LVM for pt to call back and clarify.

## 2017-06-30 NOTE — Telephone Encounter (Signed)
Resent script to [facility]...Johny Chess

## 2017-06-30 NOTE — Addendum Note (Signed)
Addended by: Earnstine Regal on: 06/30/2017 01:20 PM   Modules accepted: Orders

## 2017-06-30 NOTE — Telephone Encounter (Signed)
Script was sent to incorrect pharmacy.  Please see prior message in this phone note.  [facility].  For additional info on [facility] please see phone note dated 8/31.

## 2017-07-04 NOTE — Telephone Encounter (Signed)
Pharmacies have been updated.

## 2017-08-08 ENCOUNTER — Other Ambulatory Visit: Payer: Self-pay | Admitting: Gastroenterology

## 2017-08-17 ENCOUNTER — Other Ambulatory Visit (INDEPENDENT_AMBULATORY_CARE_PROVIDER_SITE_OTHER): Payer: Self-pay

## 2017-08-17 ENCOUNTER — Ambulatory Visit (INDEPENDENT_AMBULATORY_CARE_PROVIDER_SITE_OTHER): Payer: 59

## 2017-08-17 DIAGNOSIS — Z23 Encounter for immunization: Secondary | ICD-10-CM | POA: Diagnosis not present

## 2017-08-17 DIAGNOSIS — E7800 Pure hypercholesterolemia, unspecified: Secondary | ICD-10-CM

## 2017-08-17 LAB — COMPREHENSIVE METABOLIC PANEL
ALT: 17 U/L (ref 0–35)
AST: 18 U/L (ref 0–37)
Albumin: 4.5 g/dL (ref 3.5–5.2)
Alkaline Phosphatase: 45 U/L (ref 39–117)
BUN: 18 mg/dL (ref 6–23)
CO2: 30 mEq/L (ref 19–32)
Calcium: 9.6 mg/dL (ref 8.4–10.5)
Chloride: 102 mEq/L (ref 96–112)
Creatinine, Ser: 1.12 mg/dL (ref 0.40–1.20)
GFR: 55.2 mL/min — ABNORMAL LOW (ref >60.00)
Glucose, Bld: 106 mg/dL — ABNORMAL HIGH (ref 70–99)
Potassium: 3.8 mEq/L (ref 3.5–5.1)
Sodium: 140 mEq/L (ref 135–145)
Total Bilirubin: 0.5 mg/dL (ref 0.2–1.2)
Total Protein: 7 g/dL (ref 6.0–8.3)

## 2017-08-17 LAB — LIPID PANEL
Cholesterol: 237 mg/dL — ABNORMAL HIGH (ref 0–200)
HDL: 47.3 mg/dL (ref >39.00)
LDL Cholesterol: 165 mg/dL — ABNORMAL HIGH (ref 0–99)
NonHDL: 189.95
Total CHOL/HDL Ratio: 5
Triglycerides: 124 mg/dL (ref 0.0–149.0)
VLDL: 24.8 mg/dL (ref 0.0–40.0)

## 2017-08-21 ENCOUNTER — Other Ambulatory Visit: Payer: Self-pay | Admitting: Internal Medicine

## 2017-08-21 MED ORDER — ATORVASTATIN CALCIUM 20 MG PO TABS: 20.0000 mg | ORAL_TABLET | Freq: Every day | ORAL | 3 refills | Status: DC

## 2017-09-08 ENCOUNTER — Other Ambulatory Visit: Payer: Self-pay

## 2017-09-08 ENCOUNTER — Encounter: Payer: Self-pay | Admitting: Internal Medicine

## 2017-09-08 ENCOUNTER — Ambulatory Visit (INDEPENDENT_AMBULATORY_CARE_PROVIDER_SITE_OTHER)
Admission: RE | Admit: 2017-09-08 | Discharge: 2017-09-08 | Disposition: A | Discharge status: Home / Self Care | Payer: 59 | Source: Ambulatory Visit | Attending: Internal Medicine | Admitting: Internal Medicine

## 2017-09-08 ENCOUNTER — Ambulatory Visit (INDEPENDENT_AMBULATORY_CARE_PROVIDER_SITE_OTHER): Payer: 59 | Admitting: Internal Medicine

## 2017-09-08 ENCOUNTER — Emergency Department (HOSPITAL_COMMUNITY): Payer: 59

## 2017-09-08 ENCOUNTER — Encounter (HOSPITAL_COMMUNITY): Payer: Self-pay | Admitting: Emergency Medicine

## 2017-09-08 ENCOUNTER — Emergency Department (HOSPITAL_COMMUNITY)
Admission: EM | Admit: 2017-09-08 | Discharge: 2017-09-08 | Disposition: A | Discharge status: Home / Self Care | Payer: 59 | Attending: Emergency Medicine | Admitting: Emergency Medicine

## 2017-09-08 ENCOUNTER — Other Ambulatory Visit (INDEPENDENT_AMBULATORY_CARE_PROVIDER_SITE_OTHER): Payer: Self-pay

## 2017-09-08 VITALS — BP 120/68 | HR 75 | Temp 98.7°F | Ht 67.0 in | Wt 163.0 lb

## 2017-09-08 DIAGNOSIS — R1031 Right lower quadrant pain: Secondary | ICD-10-CM | POA: Insufficient documentation

## 2017-09-08 DIAGNOSIS — R102 Pelvic and perineal pain: Secondary | ICD-10-CM | POA: Diagnosis not present

## 2017-09-08 DIAGNOSIS — Z79899 Other long term (current) drug therapy: Secondary | ICD-10-CM | POA: Diagnosis not present

## 2017-09-08 DIAGNOSIS — E7849 Other hyperlipidemia: Secondary | ICD-10-CM

## 2017-09-08 DIAGNOSIS — R7303 Prediabetes: Secondary | ICD-10-CM

## 2017-09-08 DIAGNOSIS — N289 Disorder of kidney and ureter, unspecified: Secondary | ICD-10-CM | POA: Diagnosis not present

## 2017-09-08 DIAGNOSIS — R110 Nausea: Secondary | ICD-10-CM | POA: Diagnosis not present

## 2017-09-08 LAB — COMPREHENSIVE METABOLIC PANEL
ALT: 29 U/L (ref 0–35)
ALT: 35 U/L (ref 14–54)
AST: 23 U/L (ref 0–37)
AST: 27 U/L (ref 15–41)
Albumin: 4.5 g/dL (ref 3.5–5.2)
Albumin: 4.3 g/dL (ref 3.5–5.0)
Alkaline Phosphatase: 46 U/L (ref 39–117)
Alkaline Phosphatase: 52 U/L (ref 38–126)
Anion gap: 8 (ref 5–15)
BUN: 14 mg/dL (ref 6–23)
BUN: 10 mg/dL (ref 6–20)
CO2: 30 mEq/L (ref 19–32)
CO2: 23 mmol/L (ref 22–32)
Calcium: 9.7 mg/dL (ref 8.4–10.5)
Calcium: 9.1 mg/dL (ref 8.9–10.3)
Chloride: 103 mEq/L (ref 96–112)
Chloride: 106 mmol/L (ref 101–111)
Creatinine, Ser: 0.99 mg/dL (ref 0.40–1.20)
Creatinine, Ser: 1.01 mg/dL — ABNORMAL HIGH (ref 0.44–1.00)
GFR calc Af Amer: >60 mL/min (ref >60)
GFR calc non Af Amer: >60 mL/min (ref >60)
GFR: 63.63 mL/min (ref >60.00)
Glucose, Bld: 107 mg/dL — ABNORMAL HIGH (ref 70–99)
Glucose, Bld: 101 mg/dL — ABNORMAL HIGH (ref 65–99)
Potassium: 4 mEq/L (ref 3.5–5.1)
Potassium: 3.6 mmol/L (ref 3.5–5.1)
Sodium: 141 mEq/L (ref 135–145)
Sodium: 137 mmol/L (ref 135–145)
Total Bilirubin: 0.5 mg/dL (ref 0.2–1.2)
Total Bilirubin: 0.5 mg/dL (ref 0.3–1.2)
Total Protein: 7.1 g/dL (ref 6.0–8.3)
Total Protein: 6.7 g/dL (ref 6.5–8.1)

## 2017-09-08 LAB — LIPID PANEL
Cholesterol: 157 mg/dL (ref 0–200)
HDL: 47.1 mg/dL (ref >39.00)
LDL Cholesterol: 93 mg/dL (ref 0–99)
NonHDL: 109.86
Total CHOL/HDL Ratio: 3
Triglycerides: 86 mg/dL (ref 0.0–149.0)
VLDL: 17.2 mg/dL (ref 0.0–40.0)

## 2017-09-08 LAB — URINALYSIS, ROUTINE W REFLEX MICROSCOPIC
Bacteria, UA: NONE SEEN
Bilirubin Urine: NEGATIVE
Glucose, UA: NEGATIVE mg/dL
Hgb urine dipstick: NEGATIVE
Ketones, ur: NEGATIVE mg/dL
Leukocytes, UA: NEGATIVE
Nitrite: NEGATIVE
Protein, ur: NEGATIVE mg/dL
RBC / HPF: NONE SEEN RBC/hpf (ref 0–5)
Specific Gravity, Urine: 1.038 — ABNORMAL HIGH (ref 1.005–1.030)
Squamous Epithelial / LPF: NONE SEEN
pH: 7 (ref 5.0–8.0)

## 2017-09-08 LAB — CBC
HCT: 41.7 % (ref 36.0–46.0)
Hemoglobin: 14.2 g/dL (ref 12.0–15.0)
MCH: 29.6 pg (ref 26.0–34.0)
MCHC: 34.1 g/dL (ref 30.0–36.0)
MCV: 87.1 fL (ref 78.0–100.0)
Platelets: 234 10*3/uL (ref 150–400)
RBC: 4.79 MIL/uL (ref 3.87–5.11)
RDW: 12.9 % (ref 11.5–15.5)
WBC: 8.4 10*3/uL (ref 4.0–10.5)

## 2017-09-08 LAB — HEMOGLOBIN A1C: Hgb A1c MFr Bld: 5.7 % (ref 4.6–6.5)

## 2017-09-08 LAB — LIPASE, BLOOD: Lipase: 26 U/L (ref 11–51)

## 2017-09-08 MED ORDER — IOPAMIDOL (ISOVUE-300) INJECTION 61%
100.0000 mL | Freq: Once | INTRAVENOUS | Status: AC | PRN
  Administered 2017-09-08: 100 mL via INTRAVENOUS

## 2017-09-08 MED ORDER — ONDANSETRON HCL 4 MG/2ML IJ SOLN
4.0000 mg | Freq: Once | INTRAMUSCULAR | Status: AC
  Administered 2017-09-08: 4 mg via INTRAVENOUS
  Filled 2017-09-08: qty 2

## 2017-09-08 MED ORDER — MORPHINE SULFATE (PF) 4 MG/ML IV SOLN
4.0000 mg | Freq: Once | INTRAVENOUS | Status: AC
  Administered 2017-09-08: 4 mg via INTRAVENOUS
  Filled 2017-09-08: qty 1

## 2017-09-08 MED ORDER — IBUPROFEN 600 MG PO TABS: 600.0000 mg | ORAL_TABLET | Freq: Four times a day (QID) | ORAL | 0 refills | Status: AC | PRN

## 2017-09-08 NOTE — Progress Notes (Signed)
Subjective:    Patient ID: Pamela Novak, female    DOB: 03/26/1971, 46 y.o.   MRN: [id number]  HPI She is here for an acute visit.   Abdominal pain, RLQ pain:  She has had a general endometriosis ache for a while.  Her last period was 3 months ago.  Yesterday she had a severe pain in her right - central lower abdomen that bent her over.  It took her two minutes to catch  her breath and about 5 minutes to feel she was going to be ok.  She is having bowel movements and does not pass full BM all the time, which is not new.  Her stool is soft.   She does have IBS and has some general discomfort often, but this is different.  She denies fever, blood in the stool and urinary symptoms. She denies a history of ovarian cysts. She had sharp pain this morning in her lower abdomen and it is painful now.   Hyperlipidemia: She is taking her medication daily since last month. She is compliant with a low fat/cholesterol diet. She is exercising regularly. She denies myalgias.   Prediabetes:  She is compliant with a low sugar/carbohydrate diet.  She is exercising regularly.  Renal insufficiency:  She is drinking plenty of water and does not take any nsaids.    Medications and allergies reviewed with patient and updated if appropriate.  Patient Active Problem List   Diagnosis Date Noted  . Prediabetes 03/23/2017  . Seborrheic dermatitis of scalp 03/23/2017  . Hyperlipidemia 01/21/2016  . Internal and external hemorrhoids without complication 95/28/4132  . Depression 01/18/2016  . PTSD (post-traumatic stress disorder) 01/18/2016  . GERD (gastroesophageal reflux disease) 01/18/2016  . Recurrent tonsillitis 10/08/2014  . Varicose veins 05/21/2013  . Generalized skin lesions 05/21/2013  . GAD (generalized anxiety disorder) 11/02/2012  . Lipoma of skin and subcutaneous tissue of neck 09/26/2012    Current Outpatient Medications on File Prior to Visit  Medication Sig Dispense Refill  . ALPRAZolam  (XANAX) 0.25 MG tablet Take 1 tablet (0.25 mg total) by mouth 2 (two) times daily as needed. 60 tablet 0  . atorvastatin (LIPITOR) 20 MG tablet Take 1 tablet (20 mg total) by mouth daily. 30 tablet 3  . baclofen (LIORESAL) 10 MG tablet 1/2 BY MOUTH AT BEDTIME 15 tablet 0  . buPROPion (WELLBUTRIN XL) 300 MG 24 hr tablet Take 300 mg by mouth daily.    [name] Kitchen CARAFATE 1 GM/10ML suspension 10MLS BY MOUTH 4 TIMES A DAY WITH MEALS AND AT BEDTIME 420 mL 0  . ketoconazole (NIZORAL) 2 % shampoo Apply 1 application topically 2 (two) times a week. 360 mL 0  . lidocaine (XYLOCAINE) 2 % solution 10 ml 3 times daily as needed for throat pain. Swish and swallow. 100 mL 0  . linaclotide (LINZESS) 72 MCG capsule Take 1 capsule (72 mcg total) by mouth daily before breakfast. 30 capsule 3  . pantoprazole (PROTONIX) 40 MG tablet TAKE 1 TABLET BY MOUTH  DAILY 90 tablet 1  . ranitidine (ZANTAC) 150 MG tablet Take 1 tablet (150 mg total) by mouth at bedtime. 90 tablet 3  . sertraline (ZOLOFT) 50 MG tablet Take 50 mg by mouth daily. Take 1.5 tablets twice daily.     Current Facility-Administered Medications on File Prior to Visit  Medication Dose Route Frequency Provider Last Rate Last Dose  . 0.9 %  sodium chloride infusion  500 mL Intravenous Continuous Nandigam, Kavitha V,  MD        Past Medical History:  Diagnosis Date  . Allergy   . Anxiety   . Blood in stool   . Breast hematoma   . Complication of anesthesia    "woke up during colonoscopy"  . Depression   . GERD (gastroesophageal reflux disease)   . Hyperlipidemia   . Traumatic hematoma of knee     Past Surgical History:  Procedure Laterality Date  . COLONOSCOPY      Social History   Socioeconomic History  . Marital status: Married    Spouse name: None  . Number of children: 2  . Years of education: 12+  . Highest education level: None  Social Needs  . Financial resource strain: None  . Food insecurity - worry: None  . Food insecurity -  inability: None  . Transportation needs - medical: None  . Transportation needs - non-medical: None  Occupational History  . Occupation: adm Radiation protection practitioner: [employer]  Tobacco Use  . Smoking status: Never Smoker  . Smokeless tobacco: Never Used  Substance and Sexual Activity  . Alcohol use: Yes    Comment: rare  . Drug use: No  . Sexual activity: Yes  Other Topics Concern  . None  Social History Narrative   Regular exercise-no   Caffeine Use-yes    Family History  Problem Relation Age of Onset  . Heart disease Mother   . Arthritis Mother   . Hyperlipidemia Mother   . Hypertension Mother   . Heart attack Mother   . Cancer Father        Colon Cancer-Polyps  . Hypertension Father   . Hashimoto's thyroiditis Sister   . Diabetes Sister   . Thyroid disease Sister   . Cancer Paternal Grandfather        Colon Cancer  . Hashimoto's thyroiditis Sister   . Thyroid disease Sister     Review of Systems  Constitutional: Negative for chills and fever.  Gastrointestinal: Positive for abdominal pain, constipation (chronic, intermittent - ? constipated now) and nausea. Negative for blood in stool and diarrhea.  Genitourinary: Positive for frequency (chronic). Negative for dysuria and hematuria.       Objective:   Vitals:   09/08/17 0906  BP: 120/68  Pulse: 75  Temp: 98.7 F (37.1 C)  SpO2: 98%   Filed Weights   09/08/17 0906  Weight: 163 lb (73.9 kg)   Body mass index is 25.53 kg/m.  Wt Readings from Last 3 Encounters:  09/08/17 163 lb (73.9 kg)  05/10/17 166 lb (75.3 kg)  05/09/17 166 lb (75.3 kg)     Physical Exam Constitutional: Appears well-developed and well-nourished. No distress.  HENT:  Head: Normocephalic and atraumatic.  Neck: Neck supple. No tracheal deviation present. No thyromegaly present.  No cervical lymphadenopathy Cardiovascular: Normal rate, regular rhythm and normal heart sounds.   No murmur heard. No carotid bruit .  No  edema Pulmonary/Chest: Effort normal and breath sounds normal. No respiratory distress. No has no wheezes. No rales.  Abdomen: tenderness with palpation in RLQ w/o rebound or gaurding, non distended abdomen, no tenderness elsewhere Skin: Skin is warm and dry. Not diaphoretic.  Psychiatric: Normal mood and affect. Behavior is normal.       Assessment & Plan:   See Problem List for Assessment and Plan of chronic medical problems.

## 2017-09-08 NOTE — Assessment & Plan Note (Signed)
Mild decreased GFR with her last blood work Drinking lots of water Not taking any nsaids cmp today

## 2017-09-08 NOTE — ED Notes (Signed)
Return from US

## 2017-09-08 NOTE — Assessment & Plan Note (Signed)
Check a1c Low sugar / carb diet Stressed regular exercise   

## 2017-09-08 NOTE — Patient Instructions (Addendum)
  Test(s) ordered today. Your results will be released to MyChart (or called to you) after review, usually within 72hours after test completion. If any changes need to be made, you will be notified at that same time.  Medications reviewed and updated.  No changes recommended at this time.    Please followup in 6 months   

## 2017-09-08 NOTE — ED Provider Notes (Signed)
[facility] Provider Note   CSN: [id number] Arrival date & time: 09/08/17  1354     History   Chief Complaint Chief Complaint  Patient presents with  . Abdominal Pain    HPI Pamela Novak is a 46 y.o. female w/ h/o endometriosis and IBS-C presents to ED for evaluation of sudden onset RLQ abdominal pain that bent her over and has been slowly improving since yesterday. Pain is constant but occasionally worsens. Her pain is tolerable now, worse with palpation. Associated with mild nausea but no vomiting. Reports hot flashes and generalized malaise that she attributes to pre-menopause. LMP 3 months ago, sporadic. No fevers, chills, vomiting, diarrhea, melena, hematochezia, dysuria, hematuria, abnormal vaginal discharge or bleeding. Usually has constipation due to IBS but last BM was small, soft BM this morning. No h/o ovarian cysts, kidney stones, UTIs. No interventions PTA. Went to PCP who sent her for a CT A/P. At radiology pt was instructed to come to ED for possible cyst vs ovarian torsion. CT scan shows normal appendix and polycystic right ovary with small free fluid.  HPI  Past Medical History:  Diagnosis Date  . Allergy   . Anxiety   . Blood in stool   . Breast hematoma   . Complication of anesthesia    "woke up during colonoscopy"  . Depression   . GERD (gastroesophageal reflux disease)   . Hyperlipidemia   . Traumatic hematoma of knee     Patient Active Problem List   Diagnosis Date Noted  . Right lower quadrant abdominal pain 09/08/2017  . Renal insufficiency 09/08/2017  . Prediabetes 03/23/2017  . Seborrheic dermatitis of scalp 03/23/2017  . Hyperlipidemia 01/21/2016  . Internal and external hemorrhoids without complication 33/29/5188  . Depression 01/18/2016  . PTSD (post-traumatic stress disorder) 01/18/2016  . GERD (gastroesophageal reflux disease) 01/18/2016  . Recurrent tonsillitis 10/08/2014  . Varicose veins  05/21/2013  . Generalized skin lesions 05/21/2013  . GAD (generalized anxiety disorder) 11/02/2012  . Lipoma of skin and subcutaneous tissue of neck 09/26/2012    Past Surgical History:  Procedure Laterality Date  . COLONOSCOPY      OB History    No data available       Home Medications    Prior to Admission medications   Medication Sig Start Date End Date Taking? Authorizing Provider  ALPRAZolam (XANAX) 0.25 MG tablet Take 1 tablet (0.25 mg total) by mouth 2 (two) times daily as needed. 03/11/15  Yes Biagio Borg, MD  atorvastatin (LIPITOR) 20 MG tablet Take 1 tablet (20 mg total) by mouth daily. 08/21/17  Yes Burns, Claudina Lick, MD  baclofen (LIORESAL) 10 MG tablet 1/2 BY MOUTH AT BEDTIME Patient taking differently: 5 mg BY MOUTH AT BEDTIME 06/26/17  Yes Nandigam, Kavitha V, MD  buPROPion (WELLBUTRIN XL) 300 MG 24 hr tablet Take 300 mg by mouth daily.   Yes [provider]  CARAFATE 1 GM/10ML suspension 10MLS BY MOUTH 4 TIMES A DAY WITH MEALS AND AT BEDTIME Patient taking differently: 10MLS BY MOUTH once  a week  WITH MEALS in the evening 08/08/17  Yes Nandigam, Venia Minks, MD  ketoconazole (NIZORAL) 2 % shampoo Apply 1 application topically 2 (two) times a week. 07/03/17  Yes Burns, Claudina Lick, MD  lidocaine (XYLOCAINE) 2 % solution 10 ml 3 times daily as needed for throat pain. Swish and swallow. 05/12/17  Yes Nandigam, Venia Minks, MD  pantoprazole (PROTONIX) 40 MG tablet TAKE 1  TABLET BY MOUTH  DAILY Patient taking differently: TAKE 40 mg TABLET BY MOUTH  DAILY 07/04/17  Yes Burns, Claudina Lick, MD  ranitidine (ZANTAC) 150 MG tablet Take 1 tablet (150 mg total) by mouth at bedtime. 03/23/17  Yes Burns, Claudina Lick, MD  sertraline (ZOLOFT) 50 MG tablet Take 75 mg daily by mouth. Take 1.5 tablets twice daily.    Yes [provider]  linaclotide Rolan Lipa) 72 MCG capsule Take 1 capsule (72 mcg total) by mouth daily before breakfast. 05/09/17   Mauri Pole, MD    Family  History Family History  Problem Relation Age of Onset  . Heart disease Mother   . Arthritis Mother   . Hyperlipidemia Mother   . Hypertension Mother   . Heart attack Mother   . Cancer Father        Colon Cancer-Polyps  . Hypertension Father   . Hashimoto's thyroiditis Sister   . Diabetes Sister   . Thyroid disease Sister   . Cancer Paternal Grandfather        Colon Cancer  . Hashimoto's thyroiditis Sister   . Thyroid disease Sister     Social History Social History   Tobacco Use  . Smoking status: Never Smoker  . Smokeless tobacco: Never Used  Substance Use Topics  . Alcohol use: Yes    Comment: rare  . Drug use: No     Allergies   Capsaicin and Flagyl [metronidazole]   Review of Systems Review of Systems  All other systems reviewed and are negative.    Physical Exam Updated Vital Signs Pulse (!) 104   Temp 98.7 F (37.1 C) (Oral)   Resp 16   SpO2 99%   Physical Exam  Constitutional: She is oriented to person, place, and time. She appears well-developed and well-nourished.  No distress.   HENT:  Head: Normocephalic and atraumatic.  Nose: Nose normal.  Eyes: Conjunctivae and EOM are normal.  Neck: Normal range of motion. Neck supple.  Cardiovascular: Normal rate, regular rhythm and normal heart sounds.  No murmur heard. Pulmonary/Chest: Effort normal and breath sounds normal.  Abdominal: Soft. Bowel sounds are normal. She exhibits no distension. There is tenderness in the right lower quadrant and periumbilical area.  No suprapubic or CVA tenderness No G/R/R  Musculoskeletal: Normal range of motion. She exhibits no deformity.  Lymphadenopathy:    She has no cervical adenopathy.  Neurological: She is alert and oriented to person, place, and time. No sensory deficit.  Skin: Skin is warm and dry. Capillary refill takes less than 2 seconds.  Psychiatric: She has a normal mood and affect. Her behavior is normal. Judgment and thought content normal.   Nursing note and vitals reviewed.    ED Treatments / Results  Labs (all labs ordered are listed, but only abnormal results are displayed) Labs Reviewed  COMPREHENSIVE METABOLIC PANEL - Abnormal; Notable for the following components:      Result Value   Glucose, Bld 101 (*)    Creatinine, Ser 1.01 (*)    All other components within normal limits  URINALYSIS, ROUTINE W REFLEX MICROSCOPIC - Abnormal; Notable for the following components:   Color, Urine STRAW (*)    Specific Gravity, Urine 1.038 (*)    All other components within normal limits  LIPASE, BLOOD  CBC  PREGNANCY, URINE  I-STAT BETA HCG BLOOD, ED ([facility], [facility], [facility] ONLY)    EKG  EKG Interpretation None       Radiology Ct Abdomen Pelvis  W Contrast  Result Date: 09/08/2017 CLINICAL DATA:  Right lower quadrant tenderness for few weeks. EXAM: CT ABDOMEN AND PELVIS WITH CONTRAST TECHNIQUE: Multidetector CT imaging of the abdomen and pelvis was performed using the standard protocol following bolus administration of intravenous contrast. CONTRAST:  170mL ISOVUE-300 IOPAMIDOL (ISOVUE-300) INJECTION 61% COMPARISON:  03/02/2007 FINDINGS: Lower chest: No acute abnormality. Hepatobiliary: No focal liver abnormality is seen. No gallstones, gallbladder wall thickening, or biliary dilatation. Pancreas: Unremarkable. No pancreatic ductal dilatation or surrounding inflammatory changes. Spleen: Normal in size without focal abnormality. Adrenals/Urinary Tract: Adrenal glands are unremarkable. Kidneys are normal, without renal calculi, focal lesion, or hydronephrosis. Bladder is unremarkable. Stomach/Bowel: Stomach is within normal limits. Appendix appears normal. No evidence of bowel wall thickening, distention, or inflammatory changes. Vascular/Lymphatic: No significant vascular findings are present. No enlarged abdominal or pelvic lymph nodes. Reproductive: Multicystic appearance of the right ovary. The uterus and left adnexa are normal. Other: No  abdominal wall hernia or abnormality. No abdominopelvic ascites. Musculoskeletal: No acute or significant osseous findings. IMPRESSION: No evidence of acute abnormality within the solid abdominal organs. Multicystic appearance of the right ovary. Small amount of fluid surrounding the right adnexa.This may represent presence of several physiologic cysts, or cystic dilation of the right fallopian tube. Ovarian torsion is also on the differential diagnosis with unilateral multicystic appearing ovary. These results will be called to the ordering clinician or representative by the Radiologist Assistant, and communication documented in the PACS or zVision Dashboard. Electronically Signed   By: Fidela Salisbury M.D.   On: 09/08/2017 13:12    Procedures Procedures (including critical care time)  Medications Ordered in ED Medications  morphine 4 MG/ML injection 4 mg (4 mg Intravenous Given 09/08/17 1503)  ondansetron (ZOFRAN) injection 4 mg (4 mg Intravenous Given 09/08/17 1504)     Initial Impression / Assessment and Plan / ED Course  I have reviewed the triage vital signs and the nursing notes.  Pertinent labs & imaging results that were available during my care of the patient were reviewed by me and considered in my medical decision making (see chart for details).  Clinical Course as of Sep 08 1612  Fri Sep 08, 2017  1443 IMPRESSION: No evidence of acute abnormality within the solid abdominal organs.  Multicystic appearance of the right ovary. Small amount of fluid surrounding the right adnexa.This may represent presence of several physiologic cysts, or cystic dilation of the right fallopian tube. Ovarian torsion is also on the differential diagnosis with unilateral multicystic appearing ovary.  [CG]    Clinical Course User Index [CG] Kinnie Feil, PA-C   46 yo female presents with RLQ pain and mild nausea. No fever. Evaluated by her PCP earlier today who ordered outpatient CT A/P. CT  A/P negative for appendicitis but concerning CT A/P for possible R ovarian cyst vs ovarian torsion, she was sent to ED to r/o torsion.   On exam, pt is in NAD. She has moderate focal RLQ/suprapubic tenderness with no G/R/R. She denies vomiting, diarrhea, melena, hematochezia, dysuria, hematuria, abnormal vaginal discharge or bleeding. H/o IBS with constipation, unchanged recently, last BM this morning.   Final Clinical Impressions(s) / ED Diagnoses   Lab work unremarkable. CT A/P remarkable for multicystic R ovary with small fluid around adnexa. Higher suspicion for physiologic cysts over ovarian torsion or ectopic. Pending urine pregnancy and [place]. Anticipate discharge with OBGYN if benign work up.  Final diagnoses:  Right lower quadrant abdominal pain    ED Discharge Orders  None       Arlean Hopping 09/08/17 1613    Jola Schmidt, MD 09/08/17 2131

## 2017-09-08 NOTE — Assessment & Plan Note (Signed)
Acute pain since last night No change in stools or urinary symptoms, but has chronic GI / gyn issues including IBS, constipation and endometriosis - this pain is much different and was more severe Pain in central lower - RLQ  --  Tender on exam  Need to r/o appendicitis or ovarian cyst CT A/P today Cmp, cbc

## 2017-09-08 NOTE — ED Triage Notes (Signed)
Pt sent over from CT after having out patient ct for RLQ, possible ovarian torsion vs cyst.

## 2017-09-08 NOTE — ED Notes (Signed)
Patient transported to Ultrasound 

## 2017-09-08 NOTE — Assessment & Plan Note (Signed)
Started on lipitor last month Will recheck cholesterol, cmp

## 2017-09-25 ENCOUNTER — Ambulatory Visit: Payer: Self-pay | Admitting: Internal Medicine

## 2017-09-28 ENCOUNTER — Encounter: Payer: Self-pay | Admitting: Internal Medicine

## 2017-09-28 ENCOUNTER — Encounter: Payer: Self-pay | Admitting: Gastroenterology

## 2017-09-28 ENCOUNTER — Other Ambulatory Visit: Payer: Self-pay

## 2017-09-28 DIAGNOSIS — K2100 Gastro-esophageal reflux disease with esophagitis, without bleeding: Secondary | ICD-10-CM

## 2017-09-28 MED ORDER — LINACLOTIDE 72 MCG PO CAPS: 72.0000 ug | ORAL_CAPSULE | Freq: Every day | ORAL | 3 refills | Status: DC

## 2017-09-28 MED ORDER — BACLOFEN 10 MG PO TABS: ORAL_TABLET | ORAL | 3 refills | Status: DC

## 2017-09-29 ENCOUNTER — Other Ambulatory Visit: Payer: Self-pay | Admitting: Emergency Medicine

## 2017-09-29 MED ORDER — ATORVASTATIN CALCIUM 20 MG PO TABS: 20.0000 mg | ORAL_TABLET | Freq: Every day | ORAL | 1 refills | Status: DC

## 2017-09-29 MED ORDER — PANTOPRAZOLE SODIUM 40 MG PO TBEC: 40.0000 mg | DELAYED_RELEASE_TABLET | Freq: Every day | ORAL | 1 refills | Status: DC

## 2017-09-29 MED ORDER — RANITIDINE HCL 150 MG PO TABS: 150.0000 mg | ORAL_TABLET | Freq: Every day | ORAL | 1 refills | Status: DC

## 2017-09-29 NOTE — Telephone Encounter (Signed)
Refills sent to [facility]

## 2017-10-31 HISTORY — PX: COLONOSCOPY: SHX174

## 2017-11-21 DIAGNOSIS — Z01419 Encounter for gynecological examination (general) (routine) without abnormal findings: Secondary | ICD-10-CM | POA: Diagnosis not present

## 2017-11-21 DIAGNOSIS — Z1231 Encounter for screening mammogram for malignant neoplasm of breast: Secondary | ICD-10-CM | POA: Diagnosis not present

## 2017-11-21 DIAGNOSIS — Z6826 Body mass index (BMI) 26.0-26.9, adult: Secondary | ICD-10-CM | POA: Diagnosis not present

## 2017-11-22 LAB — HM MAMMOGRAPHY

## 2017-12-01 ENCOUNTER — Encounter: Payer: Self-pay | Admitting: Internal Medicine

## 2018-02-27 DIAGNOSIS — J3089 Other allergic rhinitis: Secondary | ICD-10-CM | POA: Diagnosis not present

## 2018-02-27 DIAGNOSIS — J4530 Mild persistent asthma, uncomplicated: Secondary | ICD-10-CM | POA: Diagnosis not present

## 2018-02-27 DIAGNOSIS — K219 Gastro-esophageal reflux disease without esophagitis: Secondary | ICD-10-CM | POA: Diagnosis not present

## 2018-02-27 DIAGNOSIS — J383 Other diseases of vocal cords: Secondary | ICD-10-CM | POA: Diagnosis not present

## 2018-03-21 ENCOUNTER — Encounter: Payer: Self-pay | Admitting: Internal Medicine

## 2018-03-21 ENCOUNTER — Ambulatory Visit: Payer: Federal, State, Local not specified - PPO | Admitting: Internal Medicine

## 2018-03-21 VITALS — BP 130/82 | HR 89 | Ht 67.0 in | Wt 173.0 lb

## 2018-03-21 DIAGNOSIS — M545 Low back pain, unspecified: Secondary | ICD-10-CM | POA: Insufficient documentation

## 2018-03-21 DIAGNOSIS — M5126 Other intervertebral disc displacement, lumbar region: Secondary | ICD-10-CM | POA: Insufficient documentation

## 2018-03-21 MED ORDER — KETOROLAC TROMETHAMINE 60 MG/2ML IM SOLN
60.0000 mg | Freq: Once | INTRAMUSCULAR | Status: AC
  Administered 2018-03-21: 60 mg via INTRAMUSCULAR

## 2018-03-21 MED ORDER — CYCLOBENZAPRINE HCL 5 MG PO TABS: 5.0000 mg | ORAL_TABLET | Freq: Three times a day (TID) | ORAL | 0 refills | Status: DC | PRN

## 2018-03-21 MED ORDER — DICLOFENAC 35 MG PO CAPS: 1.0000 | ORAL_CAPSULE | Freq: Three times a day (TID) | ORAL | 0 refills | Status: DC | PRN

## 2018-03-21 NOTE — Patient Instructions (Signed)

## 2018-03-21 NOTE — Progress Notes (Signed)
Subjective:  Patient ID: Pamela Novak, female    DOB: 1971-02-24  Age: 47 y.o. MRN: [id number]  CC: Back Pain   HPI Pamela Novak presents for a 2-day history of low back pain.  She was standing in the shower yesterday when she raised her right leg and felt a pop in her lower back.  Since then she has had nonradiating low back pain that she describes as a "knife stabbing" her with intermittent episodes of "aching, spasm, catching, and stabbing."  She denies any paresthesias in her lower extremity.  Outpatient Medications Prior to Visit  Medication Sig Dispense Refill  . ALPRAZolam (XANAX) 0.25 MG tablet Take 1 tablet (0.25 mg total) by mouth 2 (two) times daily as needed. 60 tablet 0  . atorvastatin (LIPITOR) 20 MG tablet Take 1 tablet (20 mg total) by mouth daily. 90 tablet 1  . buPROPion (WELLBUTRIN XL) 300 MG 24 hr tablet Take 300 mg by mouth daily.    [name] Kitchen CARAFATE 1 GM/10ML suspension 10MLS BY MOUTH 4 TIMES A DAY WITH MEALS AND AT BEDTIME (Patient taking differently: 10MLS BY MOUTH once  a week  WITH MEALS in the evening) 420 mL 0  . ketoconazole (NIZORAL) 2 % shampoo Apply 1 application topically 2 (two) times a week. 360 mL 0  . lidocaine (XYLOCAINE) 2 % solution 10 ml 3 times daily as needed for throat pain. Swish and swallow. 100 mL 0  . linaclotide (LINZESS) 72 MCG capsule Take 1 capsule (72 mcg total) by mouth daily before breakfast. 90 capsule 3  . pantoprazole (PROTONIX) 40 MG tablet Take 1 tablet (40 mg total) by mouth daily. 90 tablet 1  . ranitidine (ZANTAC) 150 MG tablet Take 1 tablet (150 mg total) by mouth at bedtime. 90 tablet 1  . sertraline (ZOLOFT) 50 MG tablet Take 75 mg daily by mouth. Take 1.5 tablets twice daily.     . baclofen (LIORESAL) 10 MG tablet 5 mg BY MOUTH AT BEDTIME 45 tablet 3   No facility-administered medications prior to visit.     ROS Review of Systems  Constitutional: Negative for chills, fatigue and fever.  HENT: Negative.   Eyes: Negative.    Respiratory: Negative.   Cardiovascular: Negative.  Negative for leg swelling.  Gastrointestinal: Negative.  Negative for abdominal pain, diarrhea, nausea and vomiting.  Endocrine: Negative.   Genitourinary: Negative.  Negative for difficulty urinating.  Musculoskeletal: Negative.  Negative for myalgias and neck pain.  Skin: Negative.   Allergic/Immunologic: Negative.   Neurological: Negative.  Negative for dizziness, weakness, light-headedness and numbness.  Hematological: Negative for adenopathy. Does not bruise/bleed easily.  Psychiatric/Behavioral: Negative.     Objective:  BP 130/82 (BP Location: Left Arm, Patient Position: Sitting, Cuff Size: Normal)   Pulse 89   Ht 5\' 7"  (1.702 m)   Wt 173 lb (78.5 kg)   SpO2 98%   BMI 27.10 kg/m   BP Readings from Last 3 Encounters:  03/21/18 130/82  09/08/17 120/68  05/10/17 102/67    Wt Readings from Last 3 Encounters:  03/21/18 173 lb (78.5 kg)  09/08/17 163 lb (73.9 kg)  05/10/17 166 lb (75.3 kg)    Physical Exam  Constitutional: She is oriented to person, place, and time. No distress.  HENT:  Mouth/Throat: No oropharyngeal exudate.  Eyes: Conjunctivae are normal.  Neck: Normal range of motion. Neck supple. No thyromegaly present.  Cardiovascular: Normal rate, regular rhythm and normal heart sounds.  No murmur heard. Pulmonary/Chest: Effort  normal and breath sounds normal. No respiratory distress. She has no wheezes. She has no rales.  Abdominal: Soft. Bowel sounds are normal. She exhibits no mass. There is no tenderness.  Musculoskeletal: Normal range of motion. She exhibits no edema, tenderness or deformity.       Lumbar back: Normal. She exhibits normal range of motion, no tenderness, no bony tenderness, no swelling, no edema, no deformity, no pain and no spasm.  Neurological: She is alert and oriented to person, place, and time. She has normal strength. She displays no atrophy and no tremor. No sensory deficit. She  displays a negative Romberg sign.  Reflex Scores:      Tricep reflexes are 1+ on the right side and 1+ on the left side.      Bicep reflexes are 1+ on the right side and 1+ on the left side.      Brachioradialis reflexes are 1+ on the right side and 1+ on the left side.      Patellar reflexes are 1+ on the right side and 1+ on the left side.      Achilles reflexes are 1+ on the right side and 1+ on the left side. Neg SLR in BLE  Skin: Skin is warm and dry. She is not diaphoretic.  Vitals reviewed.   Lab Results  Component Value Date   WBC 8.4 09/08/2017   HGB 14.2 09/08/2017   HCT 41.7 09/08/2017   PLT 234 09/08/2017   GLUCOSE 101 (H) 09/08/2017   CHOL 157 09/08/2017   TRIG 86.0 09/08/2017   HDL 47.10 09/08/2017   LDLCALC 93 09/08/2017   ALT 35 09/08/2017   AST 27 09/08/2017   NA 137 09/08/2017   K 3.6 09/08/2017   CL 106 09/08/2017   CREATININE 1.01 (H) 09/08/2017   BUN 10 09/08/2017   CO2 23 09/08/2017   TSH 1.43 03/23/2017   HGBA1C 5.7 09/08/2017    US Transvaginal Non-ob  Result Date: 09/08/2017 CLINICAL DATA:  Acute right-sided pelvic pain since yesterday. Previous uterine ablation 3 years ago. EXAM: TRANSABDOMINAL AND TRANSVAGINAL ULTRASOUND OF PELVIS DOPPLER ULTRASOUND OF OVARIES TECHNIQUE: Both transabdominal and transvaginal ultrasound examinations of the pelvis were performed. Transabdominal technique was performed for global imaging of the pelvis including uterus, ovaries, adnexal regions, and pelvic cul-de-sac. It was necessary to proceed with endovaginal exam following the transabdominal exam to visualize the endometrium and ovaries. Color and duplex Doppler ultrasound was utilized to evaluate blood flow to the ovaries. COMPARISON:  CT 09/08/2017 FINDINGS: Uterus Measurements: 3.2 x 5.5 x 6.8 cm. No fibroids or other mass visualized. Mild myometrial heterogeneity without focal mass. There are a few nabothian cysts. Endometrium Thickness: 6 mm.  No focal abnormality  visualized. Right ovary Measurements: 2.1 x 2.6 x 3.3 cm. Normal appearance/no adnexal mass. Left ovary Measurements: 1.4 x 1.6 x 2.3 cm. Normal appearance/no adnexal mass. Pulsed Doppler evaluation of both ovaries demonstrates normal low-resistance arterial and venous waveforms. Other findings Trace free fluid. IMPRESSION: Normal pelvic ultrasound.  No evidence of ovarian torsion. Electronically Signed   By: Marin Olp M.D.   On: 09/08/2017 16:18   US Pelvis Complete  Result Date: 09/08/2017 CLINICAL DATA:  Acute right-sided pelvic pain since yesterday. Previous uterine ablation 3 years ago. EXAM: TRANSABDOMINAL AND TRANSVAGINAL ULTRASOUND OF PELVIS DOPPLER ULTRASOUND OF OVARIES TECHNIQUE: Both transabdominal and transvaginal ultrasound examinations of the pelvis were performed. Transabdominal technique was performed for global imaging of the pelvis including uterus, ovaries, adnexal regions, and pelvic  cul-de-sac. It was necessary to proceed with endovaginal exam following the transabdominal exam to visualize the endometrium and ovaries. Color and duplex Doppler ultrasound was utilized to evaluate blood flow to the ovaries. COMPARISON:  CT 09/08/2017 FINDINGS: Uterus Measurements: 3.2 x 5.5 x 6.8 cm. No fibroids or other mass visualized. Mild myometrial heterogeneity without focal mass. There are a few nabothian cysts. Endometrium Thickness: 6 mm.  No focal abnormality visualized. Right ovary Measurements: 2.1 x 2.6 x 3.3 cm. Normal appearance/no adnexal mass. Left ovary Measurements: 1.4 x 1.6 x 2.3 cm. Normal appearance/no adnexal mass. Pulsed Doppler evaluation of both ovaries demonstrates normal low-resistance arterial and venous waveforms. Other findings Trace free fluid. IMPRESSION: Normal pelvic ultrasound.  No evidence of ovarian torsion. Electronically Signed   By: Marin Olp M.D.   On: 09/08/2017 16:18   Ct Abdomen Pelvis W Contrast  Result Date: 09/08/2017 CLINICAL DATA:  Right lower  quadrant tenderness for few weeks. EXAM: CT ABDOMEN AND PELVIS WITH CONTRAST TECHNIQUE: Multidetector CT imaging of the abdomen and pelvis was performed using the standard protocol following bolus administration of intravenous contrast. CONTRAST:  160mL ISOVUE-300 IOPAMIDOL (ISOVUE-300) INJECTION 61% COMPARISON:  03/02/2007 FINDINGS: Lower chest: No acute abnormality. Hepatobiliary: No focal liver abnormality is seen. No gallstones, gallbladder wall thickening, or biliary dilatation. Pancreas: Unremarkable. No pancreatic ductal dilatation or surrounding inflammatory changes. Spleen: Normal in size without focal abnormality. Adrenals/Urinary Tract: Adrenal glands are unremarkable. Kidneys are normal, without renal calculi, focal lesion, or hydronephrosis. Bladder is unremarkable. Stomach/Bowel: Stomach is within normal limits. Appendix appears normal. No evidence of bowel wall thickening, distention, or inflammatory changes. Vascular/Lymphatic: No significant vascular findings are present. No enlarged abdominal or pelvic lymph nodes. Reproductive: Multicystic appearance of the right ovary. The uterus and left adnexa are normal. Other: No abdominal wall hernia or abnormality. No abdominopelvic ascites. Musculoskeletal: No acute or significant osseous findings. IMPRESSION: No evidence of acute abnormality within the solid abdominal organs. Multicystic appearance of the right ovary. Small amount of fluid surrounding the right adnexa.This may represent presence of several physiologic cysts, or cystic dilation of the right fallopian tube. Ovarian torsion is also on the differential diagnosis with unilateral multicystic appearing ovary. These results will be called to the ordering clinician or representative by the Radiologist Assistant, and communication documented in the PACS or zVision Dashboard. Electronically Signed   By: Fidela Salisbury M.D.   On: 09/08/2017 13:12   [place] Art/ven Flow Abd Pelv Doppler  Result  Date: 09/08/2017 CLINICAL DATA:  Acute right-sided pelvic pain since yesterday. Previous uterine ablation 3 years ago. EXAM: TRANSABDOMINAL AND TRANSVAGINAL ULTRASOUND OF PELVIS DOPPLER ULTRASOUND OF OVARIES TECHNIQUE: Both transabdominal and transvaginal ultrasound examinations of the pelvis were performed. Transabdominal technique was performed for global imaging of the pelvis including uterus, ovaries, adnexal regions, and pelvic cul-de-sac. It was necessary to proceed with endovaginal exam following the transabdominal exam to visualize the endometrium and ovaries. Color and duplex Doppler ultrasound was utilized to evaluate blood flow to the ovaries. COMPARISON:  CT 09/08/2017 FINDINGS: Uterus Measurements: 3.2 x 5.5 x 6.8 cm. No fibroids or other mass visualized. Mild myometrial heterogeneity without focal mass. There are a few nabothian cysts. Endometrium Thickness: 6 mm.  No focal abnormality visualized. Right ovary Measurements: 2.1 x 2.6 x 3.3 cm. Normal appearance/no adnexal mass. Left ovary Measurements: 1.4 x 1.6 x 2.3 cm. Normal appearance/no adnexal mass. Pulsed Doppler evaluation of both ovaries demonstrates normal low-resistance arterial and venous waveforms. Other findings Trace free fluid. IMPRESSION:  Normal pelvic ultrasound.  No evidence of ovarian torsion. Electronically Signed   By: Marin Olp M.D.   On: 09/08/2017 16:18    Assessment & Plan:   Pamela Novak was seen today for back pain.  Diagnoses and all orders for this visit:  Bilateral low back pain, unspecified chronicity, with sciatica presence unspecified- see below -     Diclofenac (ZORVOLEX) 35 MG CAPS; Take 1 tablet by mouth 3 (three) times daily with meals as needed. -     cyclobenzaprine (FLEXERIL) 5 MG tablet; Take 1 tablet (5 mg total) by mouth 3 (three) times daily as needed for muscle spasms. -     ketorolac (TORADOL) injection 60 mg  Lumbar disc herniation- Her symptoms are consistent with a disc herniation but there  is no evidence of radiculopathy and she is neurologically intact.  Will control her pain with an anti-inflammatory and muscle relaxer.  I have also recommended that she take a 6-day course of methylprednisolone to reduce the pain and inflammation. -     Diclofenac (ZORVOLEX) 35 MG CAPS; Take 1 tablet by mouth 3 (three) times daily with meals as needed. -     cyclobenzaprine (FLEXERIL) 5 MG tablet; Take 1 tablet (5 mg total) by mouth 3 (three) times daily as needed for muscle spasms. -     ketorolac (TORADOL) injection 60 mg   I have discontinued Pamela Novak's baclofen. I am also having her start on Diclofenac, cyclobenzaprine, and methylPREDNISolone. Additionally, I am having her maintain her ALPRAZolam, buPROPion, sertraline, lidocaine, ketoconazole, CARAFATE, linaclotide, ranitidine, pantoprazole, and atorvastatin. We administered ketorolac.  Meds ordered this encounter  Medications  . Diclofenac (ZORVOLEX) 35 MG CAPS    Sig: Take 1 tablet by mouth 3 (three) times daily with meals as needed.    Dispense:  12 capsule    Refill:  0  . cyclobenzaprine (FLEXERIL) 5 MG tablet    Sig: Take 1 tablet (5 mg total) by mouth 3 (three) times daily as needed for muscle spasms.    Dispense:  45 tablet    Refill:  0  . ketorolac (TORADOL) injection 60 mg  . methylPREDNISolone (MEDROL DOSEPAK) 4 MG TBPK tablet    Sig: TAKE AS DIRECTED    Dispense:  21 tablet    Refill:  0     Follow-up: Return in about 3 weeks (around 04/11/2018).  Scarlette Calico, MD

## 2018-03-22 LAB — HM PAP SMEAR: HM Pap smear: NEGATIVE

## 2018-03-22 MED ORDER — METHYLPREDNISOLONE 4 MG PO TBPK: ORAL_TABLET | ORAL | 0 refills | Status: DC

## 2018-03-27 ENCOUNTER — Other Ambulatory Visit: Payer: Self-pay | Admitting: Internal Medicine

## 2018-04-12 ENCOUNTER — Telehealth: Payer: Self-pay | Admitting: Internal Medicine

## 2018-04-12 ENCOUNTER — Encounter: Payer: Self-pay | Admitting: Internal Medicine

## 2018-04-12 ENCOUNTER — Ambulatory Visit: Payer: Federal, State, Local not specified - PPO | Admitting: Internal Medicine

## 2018-04-12 VITALS — BP 110/80 | HR 83 | Temp 99.3°F | Resp 16 | Ht 67.0 in | Wt 175.0 lb

## 2018-04-12 DIAGNOSIS — M545 Low back pain, unspecified: Secondary | ICD-10-CM

## 2018-04-12 DIAGNOSIS — G8929 Other chronic pain: Secondary | ICD-10-CM

## 2018-04-12 DIAGNOSIS — G43001 Migraine without aura, not intractable, with status migrainosus: Secondary | ICD-10-CM | POA: Diagnosis not present

## 2018-04-12 DIAGNOSIS — M5126 Other intervertebral disc displacement, lumbar region: Secondary | ICD-10-CM | POA: Diagnosis not present

## 2018-04-12 MED ORDER — ZOLMITRIPTAN 5 MG NA SOLN: 1.0000 | NASAL | 3 refills | Status: DC | PRN

## 2018-04-12 NOTE — Telephone Encounter (Signed)
Copied from [place] [id number]. Topic: Quick Communication - Rx Refill/Question >> Apr 12, 2018  1:28 PM Oliver Pila B wrote: Medication: pantoprazole (PROTONIX) 40 MG tablet [[id number]]   The pharmacy called b/c they are trying to get an override to fill the medication above but the pcp is going to need to call the pt's insurance @ [phone]; call pharmacy for any questions

## 2018-04-12 NOTE — Progress Notes (Signed)
Subjective:  Patient ID: Pamela Novak, female    DOB: 1971/02/22  Age: 47 y.o. MRN: [id number]  CC: Back Pain and Headache   HPI Pamela Novak presents for f/up - she tells me that her back pain is better.  She has stopped taking the Flexeril and is not tolerating the anti-inflammatories.  She still has intermittent episodes of non-rating low back pain. She denies numbness, weakness, tingling in her lower extremities.  She also complains of a several year history of intermittent headaches that she describes as migraines.  She typically takes Excedrin for symptom relief.  She has a headache about once a week and says the headaches last for 2 days and can cause her to miss work and other activities.  The headaches are described as a pounding sensation through her entire head with nausea, photophobia, and photophobia.  She says the headaches have not recently worsened or change.  She does not experience vomiting, paresthesias, neck pain, or syncope.   Mychart message received after the visit --  "Yesterday, you had me take Zomig for my migraine. About 20 mins after taking it, my vision cleared up. I was unaware my vision was even impaired until the static-like veil of black lifted; I was also very unaware of how much pain I was in... You asked if I have lasting effects from the suspected herniated disc in my lower back. I said that I didn't think so & I was feeling better. Now that the migraine pain has lifted, I was wrong; deep inside my right buttock it is quite sore and there is a spot on my lower back that is quite tender when I push in on it. Wanted to thank you for the life-changing Zomig, and also update you about my back in case there is something else you need me to do or take for it. FYI - Not sure if I can take the Zorvolex any longer; my intestines are just now getting back to some semblance of normalcy. Also, the only prob. Zomig has given me is intense stomach/abdom. pain I woke up  w/that (assuming it is a side effect) I'm totally willing to endure."  Outpatient Medications Prior to Visit  Medication Sig Dispense Refill  . atorvastatin (LIPITOR) 20 MG tablet Take 1 tablet (20 mg total) by mouth daily. -- Office visit needed for further refills 90 tablet 0  . buPROPion (WELLBUTRIN XL) 300 MG 24 hr tablet Take 300 mg by mouth daily.    [name] Kitchen CARAFATE 1 GM/10ML suspension 10MLS BY MOUTH 4 TIMES A DAY WITH MEALS AND AT BEDTIME (Patient taking differently: 10MLS BY MOUTH once  a week  WITH MEALS in the evening) 420 mL 0  . ketoconazole (NIZORAL) 2 % shampoo Apply 1 application topically 2 (two) times a week. 360 mL 0  . lidocaine (XYLOCAINE) 2 % solution 10 ml 3 times daily as needed for throat pain. Swish and swallow. 100 mL 0  . linaclotide (LINZESS) 72 MCG capsule Take 1 capsule (72 mcg total) by mouth daily before breakfast. 90 capsule 3  . pantoprazole (PROTONIX) 40 MG tablet Take 1 tablet (40 mg total) by mouth daily. -- Office visit needed for further refills 90 tablet 0  . ranitidine (ZANTAC) 150 MG tablet Take 1 tablet (150 mg total) by mouth at bedtime. -- Office visit needed for further refills 90 tablet 0  . sertraline (ZOLOFT) 50 MG tablet Take 75 mg daily by mouth. Take 1.5 tablets twice daily.     [name] Kitchen  ALPRAZolam (XANAX) 0.25 MG tablet Take 1 tablet (0.25 mg total) by mouth 2 (two) times daily as needed. 60 tablet 0  . Diclofenac (ZORVOLEX) 35 MG CAPS Take 1 tablet by mouth 3 (three) times daily with meals as needed. 12 capsule 0  . cyclobenzaprine (FLEXERIL) 5 MG tablet Take 1 tablet (5 mg total) by mouth 3 (three) times daily as needed for muscle spasms. (Patient not taking: Reported on 04/12/2018) 45 tablet 0  . methylPREDNISolone (MEDROL DOSEPAK) 4 MG TBPK tablet TAKE AS DIRECTED 21 tablet 0   No facility-administered medications prior to visit.     ROS Review of Systems  Constitutional: Negative.  Negative for appetite change, diaphoresis and fatigue.  HENT:  Negative.   Eyes: Negative for visual disturbance.  Respiratory: Negative for cough, chest tightness, shortness of breath and wheezing.   Cardiovascular: Negative for chest pain, palpitations and leg swelling.  Gastrointestinal: Negative for abdominal pain, constipation, diarrhea, nausea and vomiting.  Endocrine: Negative.   Genitourinary: Negative.  Negative for difficulty urinating.  Musculoskeletal: Positive for back pain. Negative for arthralgias, myalgias and neck pain.  Skin: Negative.  Negative for color change and rash.  Neurological: Positive for headaches. Negative for dizziness, tremors, seizures, syncope, facial asymmetry, speech difficulty, weakness, light-headedness and numbness.  Hematological: Negative for adenopathy. Does not bruise/bleed easily.  Psychiatric/Behavioral: Negative.     Objective:  BP 110/80 (BP Location: Left Arm, Patient Position: Sitting, Cuff Size: Normal)   Pulse 83   Temp 99.3 F (37.4 C) (Oral)   Resp 16   Ht 5\' 7"  (1.702 m)   Wt 175 lb (79.4 kg)   LMP 03/22/2018   SpO2 97%   BMI 27.41 kg/m   BP Readings from Last 3 Encounters:  04/12/18 110/80  03/21/18 130/82  09/08/17 120/68    Wt Readings from Last 3 Encounters:  04/12/18 175 lb (79.4 kg)  03/21/18 173 lb (78.5 kg)  09/08/17 163 lb (73.9 kg)    Physical Exam  Constitutional: She is oriented to person, place, and time.  Non-toxic appearance. She does not appear ill.  HENT:  Mouth/Throat: Oropharynx is clear and moist.  Eyes: Pupils are equal, round, and reactive to light. EOM are normal. Right eye exhibits normal extraocular motion and no nystagmus. Left eye exhibits normal extraocular motion and no nystagmus. Right pupil is round and reactive. Left pupil is round and reactive.  Neck: Normal range of motion. Neck supple. No neck rigidity.  Cardiovascular: Normal rate, regular rhythm and normal heart sounds.  No murmur heard. Pulmonary/Chest: Effort normal and breath sounds  normal. She has no wheezes. She has no rales.  Abdominal: Soft. Bowel sounds are normal. She exhibits no mass. There is no tenderness. There is no guarding.  Musculoskeletal: Normal range of motion. She exhibits no edema or tenderness.  Neurological: She is alert and oriented to person, place, and time. She has normal strength. She displays no atrophy, no tremor and normal reflexes. No sensory deficit. She exhibits normal muscle tone. She displays a negative Romberg sign. She displays no seizure activity. Coordination and gait normal. She displays no Babinski's sign on the right side. She displays no Babinski's sign on the left side.  Neg SLR in BLE  Skin: Skin is warm and dry.  Psychiatric: She has a normal mood and affect. Her behavior is normal. Her mood appears not anxious. She is not agitated.  Vitals reviewed.   Lab Results  Component Value Date   WBC 8.4 09/08/2017  HGB 14.2 09/08/2017   HCT 41.7 09/08/2017   PLT 234 09/08/2017   GLUCOSE 101 (H) 09/08/2017   CHOL 157 09/08/2017   TRIG 86.0 09/08/2017   HDL 47.10 09/08/2017   LDLCALC 93 09/08/2017   ALT 35 09/08/2017   AST 27 09/08/2017   NA 137 09/08/2017   K 3.6 09/08/2017   CL 106 09/08/2017   CREATININE 1.01 (H) 09/08/2017   BUN 10 09/08/2017   CO2 23 09/08/2017   TSH 1.43 03/23/2017   HGBA1C 5.7 09/08/2017    US Transvaginal Non-ob  Result Date: 09/08/2017 CLINICAL DATA:  Acute right-sided pelvic pain since yesterday. Previous uterine ablation 3 years ago. EXAM: TRANSABDOMINAL AND TRANSVAGINAL ULTRASOUND OF PELVIS DOPPLER ULTRASOUND OF OVARIES TECHNIQUE: Both transabdominal and transvaginal ultrasound examinations of the pelvis were performed. Transabdominal technique was performed for global imaging of the pelvis including uterus, ovaries, adnexal regions, and pelvic cul-de-sac. It was necessary to proceed with endovaginal exam following the transabdominal exam to visualize the endometrium and ovaries. Color and duplex  Doppler ultrasound was utilized to evaluate blood flow to the ovaries. COMPARISON:  CT 09/08/2017 FINDINGS: Uterus Measurements: 3.2 x 5.5 x 6.8 cm. No fibroids or other mass visualized. Mild myometrial heterogeneity without focal mass. There are a few nabothian cysts. Endometrium Thickness: 6 mm.  No focal abnormality visualized. Right ovary Measurements: 2.1 x 2.6 x 3.3 cm. Normal appearance/no adnexal mass. Left ovary Measurements: 1.4 x 1.6 x 2.3 cm. Normal appearance/no adnexal mass. Pulsed Doppler evaluation of both ovaries demonstrates normal low-resistance arterial and venous waveforms. Other findings Trace free fluid. IMPRESSION: Normal pelvic ultrasound.  No evidence of ovarian torsion. Electronically Signed   By: Marin Olp M.D.   On: 09/08/2017 16:18   US Pelvis Complete  Result Date: 09/08/2017 CLINICAL DATA:  Acute right-sided pelvic pain since yesterday. Previous uterine ablation 3 years ago. EXAM: TRANSABDOMINAL AND TRANSVAGINAL ULTRASOUND OF PELVIS DOPPLER ULTRASOUND OF OVARIES TECHNIQUE: Both transabdominal and transvaginal ultrasound examinations of the pelvis were performed. Transabdominal technique was performed for global imaging of the pelvis including uterus, ovaries, adnexal regions, and pelvic cul-de-sac. It was necessary to proceed with endovaginal exam following the transabdominal exam to visualize the endometrium and ovaries. Color and duplex Doppler ultrasound was utilized to evaluate blood flow to the ovaries. COMPARISON:  CT 09/08/2017 FINDINGS: Uterus Measurements: 3.2 x 5.5 x 6.8 cm. No fibroids or other mass visualized. Mild myometrial heterogeneity without focal mass. There are a few nabothian cysts. Endometrium Thickness: 6 mm.  No focal abnormality visualized. Right ovary Measurements: 2.1 x 2.6 x 3.3 cm. Normal appearance/no adnexal mass. Left ovary Measurements: 1.4 x 1.6 x 2.3 cm. Normal appearance/no adnexal mass. Pulsed Doppler evaluation of both ovaries demonstrates  normal low-resistance arterial and venous waveforms. Other findings Trace free fluid. IMPRESSION: Normal pelvic ultrasound.  No evidence of ovarian torsion. Electronically Signed   By: Marin Olp M.D.   On: 09/08/2017 16:18   Ct Abdomen Pelvis W Contrast  Result Date: 09/08/2017 CLINICAL DATA:  Right lower quadrant tenderness for few weeks. EXAM: CT ABDOMEN AND PELVIS WITH CONTRAST TECHNIQUE: Multidetector CT imaging of the abdomen and pelvis was performed using the standard protocol following bolus administration of intravenous contrast. CONTRAST:  168mL ISOVUE-300 IOPAMIDOL (ISOVUE-300) INJECTION 61% COMPARISON:  03/02/2007 FINDINGS: Lower chest: No acute abnormality. Hepatobiliary: No focal liver abnormality is seen. No gallstones, gallbladder wall thickening, or biliary dilatation. Pancreas: Unremarkable. No pancreatic ductal dilatation or surrounding inflammatory changes. Spleen: Normal in size without focal  abnormality. Adrenals/Urinary Tract: Adrenal glands are unremarkable. Kidneys are normal, without renal calculi, focal lesion, or hydronephrosis. Bladder is unremarkable. Stomach/Bowel: Stomach is within normal limits. Appendix appears normal. No evidence of bowel wall thickening, distention, or inflammatory changes. Vascular/Lymphatic: No significant vascular findings are present. No enlarged abdominal or pelvic lymph nodes. Reproductive: Multicystic appearance of the right ovary. The uterus and left adnexa are normal. Other: No abdominal wall hernia or abnormality. No abdominopelvic ascites. Musculoskeletal: No acute or significant osseous findings. IMPRESSION: No evidence of acute abnormality within the solid abdominal organs. Multicystic appearance of the right ovary. Small amount of fluid surrounding the right adnexa.This may represent presence of several physiologic cysts, or cystic dilation of the right fallopian tube. Ovarian torsion is also on the differential diagnosis with unilateral  multicystic appearing ovary. These results will be called to the ordering clinician or representative by the Radiologist Assistant, and communication documented in the PACS or zVision Dashboard. Electronically Signed   By: Fidela Salisbury M.D.   On: 09/08/2017 13:12   [place] Art/ven Flow Abd Pelv Doppler  Result Date: 09/08/2017 CLINICAL DATA:  Acute right-sided pelvic pain since yesterday. Previous uterine ablation 3 years ago. EXAM: TRANSABDOMINAL AND TRANSVAGINAL ULTRASOUND OF PELVIS DOPPLER ULTRASOUND OF OVARIES TECHNIQUE: Both transabdominal and transvaginal ultrasound examinations of the pelvis were performed. Transabdominal technique was performed for global imaging of the pelvis including uterus, ovaries, adnexal regions, and pelvic cul-de-sac. It was necessary to proceed with endovaginal exam following the transabdominal exam to visualize the endometrium and ovaries. Color and duplex Doppler ultrasound was utilized to evaluate blood flow to the ovaries. COMPARISON:  CT 09/08/2017 FINDINGS: Uterus Measurements: 3.2 x 5.5 x 6.8 cm. No fibroids or other mass visualized. Mild myometrial heterogeneity without focal mass. There are a few nabothian cysts. Endometrium Thickness: 6 mm.  No focal abnormality visualized. Right ovary Measurements: 2.1 x 2.6 x 3.3 cm. Normal appearance/no adnexal mass. Left ovary Measurements: 1.4 x 1.6 x 2.3 cm. Normal appearance/no adnexal mass. Pulsed Doppler evaluation of both ovaries demonstrates normal low-resistance arterial and venous waveforms. Other findings Trace free fluid. IMPRESSION: Normal pelvic ultrasound.  No evidence of ovarian torsion. Electronically Signed   By: Marin Olp M.D.   On: 09/08/2017 16:18    Assessment & Plan:   Pamela Novak was seen today for back pain and headache.  Diagnoses and all orders for this visit:  Migraine without aura and with status migrainosus, not intractable- She has chronic headaches with no recent changes or worsening.  She  has no alarming features.  Her neurologic exam is nonfocal.  I have asked her to try a triptan as needed. -     zolmitriptan (ZOMIG) 5 MG nasal solution; Place 1 spray into the nose as needed for migraine.  Lumbar disc herniation- Her symptoms have improved but she still has some discomfort in her lower back.  I have asked her to try physical therapy for symptom relief and to try modalities to prevent a recurrence of the disc herniation. -     Ambulatory referral to Physical Therapy  Chronic bilateral low back pain without sciatica-  As above, she does not tolerate NSAIDs.  I have asked her to start PT for this. -     Ambulatory referral to Physical Therapy   I have discontinued Pamela Novak's ALPRAZolam, Diclofenac, cyclobenzaprine, and methylPREDNISolone. I am also having her start on zolmitriptan. Additionally, I am having her maintain her buPROPion, sertraline, lidocaine, ketoconazole, CARAFATE, linaclotide, pantoprazole, atorvastatin, and ranitidine.  Meds ordered this encounter  Medications  . zolmitriptan (ZOMIG) 5 MG nasal solution    Sig: Place 1 spray into the nose as needed for migraine.    Dispense:  6 Units    Refill:  3     Follow-up: Return if symptoms worsen or fail to improve.  Pamela Calico, MD

## 2018-04-12 NOTE — Telephone Encounter (Signed)
[facility] called and spoke to [place], [facility] about the Pantoprazole refill. He says the patient has never [facility] and when he runs it through, the message says the provider will need to call [phone] to give an approval for this medication to be filled. He says it was [facility] in [facility]. I called the patient to find out if she has the Pantoprazole that was filled on 03/27/18 [facility] tab [facility] [place], [place], left VM to return call to the office.

## 2018-04-12 NOTE — Patient Instructions (Signed)
Migraine Headache A migraine headache is an intense, throbbing pain on one side or both sides of the head. Migraines may also cause other symptoms, such as nausea, vomiting, and sensitivity to light and noise. What are the causes? Doing or taking certain things may also trigger migraines, such as:  Alcohol.  Smoking.  Medicines, such as: ? Medicine used to treat chest pain (nitroglycerine). ? Birth control pills. ? Estrogen pills. ? Certain blood pressure medicines.  Aged cheeses, chocolate, or caffeine.  Foods or drinks that contain nitrates, glutamate, aspartame, or tyramine.  Physical activity.  Other things that may trigger a migraine include:  Menstruation.  Pregnancy.  Hunger.  Stress, lack of sleep, too much sleep, or fatigue.  Weather changes.  What increases the risk? The following factors may make you more likely to experience migraine headaches:  Age. Risk increases with age.  Family history of migraine headaches.  Being Caucasian.  Depression and anxiety.  Obesity.  Being a woman.  Having a hole in the heart (patent foramen ovale) or other heart problems.  What are the signs or symptoms? The main symptom of this condition is pulsating or throbbing pain. Pain may:  Happen in any area of the head, such as on one side or both sides.  Interfere with daily activities.  Get worse with physical activity.  Get worse with exposure to bright lights or loud noises.  Other symptoms may include:  Nausea.  Vomiting.  Dizziness.  General sensitivity to bright lights, loud noises, or smells.  Before you get a migraine, you may get warning signs that a migraine is developing (aura). An aura may include:  Seeing flashing lights or having blind spots.  Seeing bright spots, halos, or zigzag lines.  Having tunnel vision or blurred vision.  Having numbness or a tingling feeling.  Having trouble talking.  Having muscle weakness.  How is this  diagnosed? A migraine headache can be diagnosed based on:  Your symptoms.  A physical exam.  Tests, such as CT scan or MRI of the head. These imaging tests can help rule out other causes of headaches.  Taking fluid from the spine (lumbar puncture) and analyzing it (cerebrospinal fluid analysis, or CSF analysis).  How is this treated? A migraine headache is usually treated with medicines that:  Relieve pain.  Relieve nausea.  Prevent migraines from coming back.  Treatment may also include:  Acupuncture.  Lifestyle changes like avoiding foods that trigger migraines.  Follow these instructions at home: Medicines  Take over-the-counter and prescription medicines only as told by your health care provider.  Do not drive or use heavy machinery while taking prescription pain medicine.  To prevent or treat constipation while you are taking prescription pain medicine, your health care provider may recommend that you: ? Drink enough fluid to keep your urine clear or pale yellow. ? Take over-the-counter or prescription medicines. ? Eat foods that are high in fiber, such as fresh fruits and vegetables, whole grains, and beans. ? Limit foods that are high in fat and processed sugars, such as fried and sweet foods. Lifestyle  Avoid alcohol use.  Do not use any products that contain nicotine or tobacco, such as cigarettes and e-cigarettes. If you need help quitting, ask your health care provider.  Get at least 8 hours of sleep every night.  Limit your stress. General instructions   Keep a journal to find out what may trigger your migraine headaches. For example, write down: ? What you eat and   drink. ? How much sleep you get. ? Any change to your diet or medicines.  If you have a migraine: ? Avoid things that make your symptoms worse, such as bright lights. ? It may help to lie down in a dark, quiet room. ? Do not drive or use heavy machinery. ? Ask your health care provider  what activities are safe for you while you are experiencing symptoms.  Keep all follow-up visits as told by your health care provider. This is important. Contact a health care provider if:  You develop symptoms that are different or more severe than your usual migraine symptoms. Get help right away if:  Your migraine becomes severe.  You have a fever.  You have a stiff neck.  You have vision loss.  Your muscles feel weak or like you cannot control them.  You start to lose your balance often.  You develop trouble walking.  You faint. This information is not intended to replace advice given to you by your health care provider. Make sure you discuss any questions you have with your health care provider. Document Released: 10/17/2005 Document Revised: 05/06/2016 Document Reviewed: 04/04/2016 Elsevier Interactive Patient Education  2017 [employer]   

## 2018-04-13 ENCOUNTER — Encounter: Payer: Self-pay | Admitting: Internal Medicine

## 2018-04-13 NOTE — Telephone Encounter (Signed)
Spoke with pharmacy. RX is rejecting stating only allowing 90 tablets per 365 days. PA needs to be completed.

## 2018-04-16 MED ORDER — PANTOPRAZOLE SODIUM 40 MG PO TBEC: 40.0000 mg | DELAYED_RELEASE_TABLET | Freq: Every day | ORAL | 0 refills | Status: DC

## 2018-04-16 NOTE — Telephone Encounter (Signed)
PA completed and approved through 04/16/2019. Spoke with pt to inform. [facility] And [facility] supply to [facility] order. Pt is aware she needs to contact office back to schedule appt with Dr Quay Burow.

## 2018-04-25 ENCOUNTER — Encounter: Payer: Self-pay | Admitting: Internal Medicine

## 2018-04-26 ENCOUNTER — Other Ambulatory Visit: Payer: Self-pay

## 2018-04-26 ENCOUNTER — Encounter: Payer: Self-pay | Admitting: Physical Therapy

## 2018-04-26 ENCOUNTER — Ambulatory Visit: Payer: Federal, State, Local not specified - PPO | Attending: Internal Medicine | Admitting: Physical Therapy

## 2018-04-26 DIAGNOSIS — M5441 Lumbago with sciatica, right side: Secondary | ICD-10-CM | POA: Diagnosis not present

## 2018-04-26 DIAGNOSIS — M6281 Muscle weakness (generalized): Secondary | ICD-10-CM | POA: Insufficient documentation

## 2018-04-26 NOTE — Patient Instructions (Addendum)
   TENS UNIT  This is helpful for muscle pain and spasm.   Search and Purchase a TENS 7000 2nd edition at www.tenspros.com or www.amazon.com  (It should be less than $30)     TENS unit instructions:   Do not shower or bathe with the unit on  Turn the unit off before removing electrodes or batteries  If the electrodes lose stickiness add a drop of water to the electrodes after they are disconnected from the unit and place on plastic sheet. If you continued to have difficulty, call the TENS unit company to purchase more electrodes.  Do not apply lotion on the skin area prior to use. Make sure the skin is clean and dry as this will help prolong the life of the electrodes.  After use, always check skin for unusual red areas, rash or other skin difficulties. If there are any skin problems, does not apply electrodes to the same area.  Never remove the electrodes from the unit by pulling the wires.  Do not use the TENS unit or electrodes other than as directed.  Do not change electrode placement without consulting your therapist or physician.  Keep 2 fingers with between each electrode.   Gilad Dugger PT [facility] [address], [address] [place], [place] [address] Phone # [phone] Fax [phone] 

## 2018-04-26 NOTE — Therapy (Signed)
[facility] [address] [place], [place], [address] Phone: [phone]   Fax:  [phone]  Physical Therapy Evaluation  Patient Details  Name: Pamela Novak MRN: [id number] Date of Birth: 1971/02/28 Referring Provider: Dr. Scarlette Calico   Encounter Date: 04/26/2018  PT End of Session - 04/26/18 1833    Visit Number  1    Date for PT Re-Evaluation  06/21/18    PT Start Time  1535    PT Stop Time  1615    PT Time Calculation (min)  40 min    Activity Tolerance  Patient tolerated treatment well       Past Medical History:  Diagnosis Date  . Allergy   . Anxiety   . Blood in stool   . Breast hematoma   . Complication of anesthesia    "woke up during colonoscopy"  . Depression   . GERD (gastroesophageal reflux disease)   . Hyperlipidemia   . Traumatic hematoma of knee     Past Surgical History:  Procedure Laterality Date  . ABLATION ON ENDOMETRIOSIS  10/04/2012   Procedure: ABLATION ON ENDOMETRIOSIS;  Surgeon: Linda Hedges, DO;  Location: [facility];  Service: Gynecology;  Laterality: N/A;  . COLONOSCOPY    . CONDYLOMA EXCISION/FULGURATION  10/04/2012   Procedure: CONDYLOMA REMOVAL;  Surgeon: Linda Hedges, DO;  Location: [facility];  Service: Gynecology;  Laterality: N/A;  . DILITATION & CURRETTAGE/HYSTROSCOPY WITH NOVASURE ABLATION  10/04/2012   Procedure: DILATATION & CURETTAGE/HYSTEROSCOPY WITH NOVASURE ABLATION;  Surgeon: Linda Hedges, DO;  Location: [facility];  Service: Gynecology;  Laterality: N/A;  . LAPAROSCOPY  10/04/2012   Procedure: LAPAROSCOPY OPERATIVE;  Surgeon: Linda Hedges, DO;  Location: [facility];  Service: Gynecology;  Laterality: N/A;    There were no vitals filed for this visit.   Subjective Assessment - 04/26/18 1536    Subjective  I hurt my back in the shower  03/19/18 when I lifted my leg to wash it.  Felt and heard a pop.  Right low back, buttock pain radiates to back of the knee.  Sometimes right  foot hurts.      Pertinent History  I pop and crack everywhere and multiple joints ; migraine    Limitations  House hold activities;Sitting;Walking    How long can you sit comfortably?  need special cushion and then can sit longer;  unable to sit in waiting room chairs    How long can you walk comfortably?  hurts to lift leg;  1 mile    Diagnostic tests  none    Patient Stated Goals  pain to be completely gone     Currently in Pain?  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 Scarlette Calico    Onset Date/Surgical Date  03/19/18  03/19/18    Next MD Visit  as needed    Prior Therapy  after a fall years ago      Precautions   Precautions  None      Restrictions   Weight Bearing Restrictions  No      Balance Screen   Has the patient fallen in the past 6 months  No    Has the patient had a decrease in activity level because of a fear of falling?   No    Is the patient reluctant to leave their home because of a fear of falling?   No      Home Environment   Living Environment  Private residence    Type of [place] to enter    [place]  One level      Prior Function   Level of Independence  Independent    Vocation  Full time employment    Vocation Requirements  sit and stand desk    Leisure  spend time with animals, watch TV;  spend time with husband      Observation/Other Assessments   Focus on Therapeutic Outcomes (FOTO)   59% limitation       Posture/Postural Control    Posture/Postural Control  Postural limitations    Postural Limitations  Decreased lumbar lordosis    Posture Comments  no lateral shift      AROM   AROM Assessment Site  -- repeated movements: extension preference    Lumbar Flexion  15    Lumbar Extension  10    Lumbar - Right Side Bend  32    Lumbar - Left Side Bend  35      Strength   Right Hip Flexion  5/5    Right Hip ABduction  4/5    Left Hip Flexion  5/5 pain in right low back     Right Knee Flexion  5/5    Right Knee Extension  5/5    Left Knee Flexion  5/5 pain in right low back     Left Knee Extension  5/5 pain in right low back     Lumbar Flexion  4/5    Lumbar Extension  4/5      Palpation   Palpation comment  tenderness right lumbosacral region       Special Tests   Other special tests  no improvement with manual traction       Slump test   Findings  Negative      Straight Leg Raise   Findings  Positive    Side   Right    Comment  10 degrees                Objective measurements completed on examination: See above findings.              PT Education - 04/26/18 1833    Education Details  extension trial every 2 hours, frequent change of position, use of lumbar roll;  flexion avoidance; home TENS info    Person(s) Educated  Patient    Methods  Explanation;Demonstration;Handout    Comprehension  Returned demonstration;Verbalized understanding       PT Short Term Goals - 04/26/18 2215      PT SHORT TERM GOAL #1   Title  The patient will demonstrate knowledge of initial HEP, centralization, basic body mechanics training and postural correction     Time  4  Period  Weeks    Status  New    Target Date  05/24/18      PT SHORT TERM GOAL #2   Title  The patient will have improved lumbar flexion to 30 degrees and extension to 15 degrees needed for improved mobility for ADLs    Time  4    Period  Weeks    Status  New      PT SHORT TERM GOAL #3   Title  The patient will have  centralization of symptoms 50% of the time    Time  4    Period  Weeks    Status  New      PT SHORT TERM GOAL #4   Title  The patient will demonstrate improved mobility, strength  and body mechanics to lift light to medium objects from the floor     Time  4    Period  Weeks    Status  New        PT Long Term Goals - 04/26/18 2220      PT LONG TERM GOAL #1   Title  The patient will be independent in safe, self progression of HEP     Time  8    Period  Weeks    Status  New    Target Date  06/21/18      PT LONG TERM GOAL #2   Title  The patient will report an overall 70% improvement in pain with usual ADLs    Time  8    Period  Weeks    Status  New      PT LONG TERM GOAL #3   Title  The patient will have improved lumbar ROM: flexion 45 degrees, extension to 20 degrees needed for mobility with ADLs    Time  8    Period  Weeks    Status  New      PT LONG TERM GOAL #4   Title  The patient will have improved right LE strength and core strength grossly 4+/5 needed for lifting medium objects and standing/walking for longer periods of time    Time  8    Period  Weeks    Status  New      PT LONG TERM GOAL #5   Title  FOTO functional outcome score improved from 59% limitation to 40% indicating improved function with less pain    Time  8    Period  Weeks    Status  New             Plan - 04/26/18 1834    Clinical Impression Statement  The patient reports the onset of right LBP, buttock pain and pain radiating down the posterior thigh to the back of the knee on 03/19/18 when she lifted her right leg in the shower.  She reports her pain is worsened with sitting in the wrong chair and sustained slightly forward bent position.  Lumbar ROM significantly limited especially with flexion and extension.  Repeated movement testing indicates a possible preference for extension.  +right SLR but negative slump test.  Right LE strength WNLs except slight weakness with hip abduction.  She  is fearful of movement and has not attempted to walk longer distances or exercise.  She would benefit from PT to address these deficts.      History and Personal Factors relevant to plan of care:  Minimal co-morbidities; no previous history of LBP or radiculopathy  Clinical Presentation  Stable    Clinical Decision Making  Low    Rehab Potential  Good    Clinical Impairments Affecting Rehab Potential  anxiety     PT Frequency  2x / week    PT Duration  8 weeks    PT Treatment/Interventions  ADLs/Self Care Home Management;Cryotherapy;Electrical Stimulation;Therapeutic activities;Therapeutic exercise;Ultrasound;Traction;Patient/family education;Manual techniques;Dry needling;Neuromuscular re-education;Taping;Moist Heat    PT Next Visit Plan  assess response to trial of standing and prone extensions every 2 hours;  progress per [place];  manual therapy;  modalities for pain control     Consulted and Agree with Plan of Care  Patient       Patient will benefit from skilled therapeutic intervention in order to improve the following deficits and impairments:  Pain, Decreased range of motion, Decreased activity tolerance, Decreased strength, Impaired perceived functional ability  Visit Diagnosis: Acute right-sided low back pain with right-sided sciatica - Plan: PT plan of care cert/re-cert  Muscle weakness (generalized) - Plan: PT plan of care cert/re-cert     Problem List Patient Active Problem List   Diagnosis Date Noted  . Migraine without aura and with status migrainosus, not intractable 04/12/2018  . Bilateral low back pain 03/21/2018  . Lumbar disc herniation 03/21/2018  . Prediabetes 03/23/2017  . Seborrheic dermatitis of scalp 03/23/2017  . Hyperlipidemia 01/21/2016  . Depression 01/18/2016  . PTSD (post-traumatic stress disorder) 01/18/2016  . GERD (gastroesophageal reflux disease) 01/18/2016  . Varicose veins 05/21/2013  . GAD (generalized anxiety disorder) 11/02/2012    Ruben Im, PT 04/26/18 10:26 PM Phone: [phone] Fax: [phone]  Alvera Singh 04/26/2018, 10:26 PM  [facility] [address] [place], [place], [address] Phone: [phone]   Fax:  [phone]  Name: Pamela Novak MRN: [id number] Date of Birth: 1970-11-15

## 2018-04-30 ENCOUNTER — Ambulatory Visit: Payer: Federal, State, Local not specified - PPO | Attending: Internal Medicine

## 2018-04-30 DIAGNOSIS — M6281 Muscle weakness (generalized): Secondary | ICD-10-CM | POA: Insufficient documentation

## 2018-04-30 DIAGNOSIS — M5441 Lumbago with sciatica, right side: Secondary | ICD-10-CM | POA: Diagnosis not present

## 2018-04-30 NOTE — Therapy (Signed)
[facility] [address] [place], [place], [address] Phone: [phone]   Fax:  [phone]  Physical Therapy Treatment  Patient Details  Name: Pamela Novak MRN: [id number] Date of Birth: 1970/11/23 Referring Provider: Dr. Scarlette Calico   Encounter Date: 04/30/2018  PT End of Session - 04/30/18 1441    Visit Number  2    Date for PT Re-Evaluation  06/21/18    Authorization Type  BCBS 50 visit limit    PT Start Time  1402    PT Stop Time  1451    PT Time Calculation (min)  49 min    Activity Tolerance  Patient tolerated treatment well    Behavior During Therapy  [facility] for tasks assessed/performed       Past Medical History:  Diagnosis Date  . Allergy   . Anxiety   . Blood in stool   . Breast hematoma   . Complication of anesthesia    "woke up during colonoscopy"  . Depression   . GERD (gastroesophageal reflux disease)   . Hyperlipidemia   . Traumatic hematoma of knee     Past Surgical History:  Procedure Laterality Date  . ABLATION ON ENDOMETRIOSIS  10/04/2012   Procedure: ABLATION ON ENDOMETRIOSIS;  Surgeon: Linda Hedges, DO;  Location: [facility];  Service: Gynecology;  Laterality: N/A;  . COLONOSCOPY    . CONDYLOMA EXCISION/FULGURATION  10/04/2012   Procedure: CONDYLOMA REMOVAL;  Surgeon: Linda Hedges, DO;  Location: [facility];  Service: Gynecology;  Laterality: N/A;  . DILITATION & CURRETTAGE/HYSTROSCOPY WITH NOVASURE ABLATION  10/04/2012   Procedure: DILATATION & CURETTAGE/HYSTEROSCOPY WITH NOVASURE ABLATION;  Surgeon: Linda Hedges, DO;  Location: [facility];  Service: Gynecology;  Laterality: N/A;  . LAPAROSCOPY  10/04/2012   Procedure: LAPAROSCOPY OPERATIVE;  Surgeon: Linda Hedges, DO;  Location: [facility];  Service: Gynecology;  Laterality: N/A;    There were no vitals filed for this visit.  Subjective Assessment - 04/30/18 1406    Subjective  I am still having pain.  My back is more painful.  I got a home TENs  unit.    Patient Stated Goals  pain to be completely gone     Currently in Pain?  Yes    Pain Score  4     Pain Location  Back    Pain Orientation  Right;Lower    Pain Descriptors / Indicators  Sore    Pain Type  Acute pain    Pain Onset  More than a month ago    Pain Frequency  Constant    Aggravating Factors   stepping up, driving (DF of Rt foot)    Pain Relieving Factors  nothing                       [facility] Adult PT Treatment/Exercise - 04/30/18 0001      Exercises   Exercises  Knee/Hip;Lumbar      Lumbar Exercises: Standing   Other Standing Lumbar Exercises  standing lumbar extension x 10      Lumbar Exercises: Prone   Other Prone Lumbar Exercises  on elbows: 3 minutes, press ups x 10      Modalities   Modalities  Traction      Traction   Type of Traction  Lumbar    Min (lbs)  50    Max (lbs)  80    Hold Time  60    Rest Time  20  Time  15             PT Education - 04/30/18 1434    Education Details  home tens instruction- pt brought home TENS with her    Person(s) Educated  Patient    Methods  Explanation    Comprehension  Verbalized understanding       PT Short Term Goals - 04/26/18 2215      PT SHORT TERM GOAL #1   Title  The patient will demonstrate knowledge of initial HEP, centralization, basic body mechanics training and postural correction     Time  4    Period  Weeks    Status  New    Target Date  05/24/18      PT SHORT TERM GOAL #2   Title  The patient will have improved lumbar flexion to 30 degrees and extension to 15 degrees needed for improved mobility for ADLs    Time  4    Period  Weeks    Status  New      PT SHORT TERM GOAL #3   Title  The patient will have centralization of symptoms 50% of the time    Time  4    Period  Weeks    Status  New      PT SHORT TERM GOAL #4   Title  The patient will demonstrate improved mobility, strength  and body mechanics to lift light to medium objects from the floor      Time  4    Period  Weeks    Status  New        PT Long Term Goals - 04/26/18 2220      PT LONG TERM GOAL #1   Title  The patient will be independent in safe, self progression of HEP     Time  8    Period  Weeks    Status  New    Target Date  06/21/18      PT LONG TERM GOAL #2   Title  The patient will report an overall 70% improvement in pain with usual ADLs    Time  8    Period  Weeks    Status  New      PT LONG TERM GOAL #3   Title  The patient will have improved lumbar ROM: flexion 45 degrees, extension to 20 degrees needed for mobility with ADLs    Time  8    Period  Weeks    Status  New      PT LONG TERM GOAL #4   Title  The patient will have improved right LE strength and core strength grossly 4+/5 needed for lifting medium objects and standing/walking for longer periods of time    Time  8    Period  Weeks    Status  New      PT LONG TERM GOAL #5   Title  FOTO functional outcome score improved from 59% limitation to 40% indicating improved function with less pain    Time  8    Period  Weeks    Status  New            Plan - 04/30/18 1434    Clinical Impression Statement  Pt with first time follow-up after evaluation.  Pt with increased LBP with extension based exercises.  PT modified exercise to prone on elbows and parial range of motion for press ups.  PT provided home TENs education  for use at home and traction trial in the clinic today.  Pt remains extension based due to Rt LE symptoms.  Pt will benefit from continued PT for extension based exercise, traction, modalities, dry needling/manual as needed.      Rehab Potential  Good    Clinical Impairments Affecting Rehab Potential  anxiety     PT Frequency  2x / week    PT Duration  8 weeks    PT Treatment/Interventions  ADLs/Self Care Home Management;Cryotherapy;Electrical Stimulation;Therapeutic activities;Therapeutic exercise;Ultrasound;Traction;Patient/family education;Manual techniques;Dry  needling;Neuromuscular re-education;Taping;Moist Heat    PT Next Visit Plan  traction if helpful, dry needling to lumbar multifidi    Consulted and Agree with Plan of Care  Patient       Patient will benefit from skilled therapeutic intervention in order to improve the following deficits and impairments:  Pain, Decreased range of motion, Decreased activity tolerance, Decreased strength, Impaired perceived functional ability  Visit Diagnosis: Acute right-sided low back pain with right-sided sciatica  Muscle weakness (generalized)     Problem List Patient Active Problem List   Diagnosis Date Noted  . Migraine without aura and with status migrainosus, not intractable 04/12/2018  . Bilateral low back pain 03/21/2018  . Lumbar disc herniation 03/21/2018  . Prediabetes 03/23/2017  . Seborrheic dermatitis of scalp 03/23/2017  . Hyperlipidemia 01/21/2016  . Depression 01/18/2016  . PTSD (post-traumatic stress disorder) 01/18/2016  . GERD (gastroesophageal reflux disease) 01/18/2016  . Varicose veins 05/21/2013  . GAD (generalized anxiety disorder) 11/02/2012    Sigurd Sos, PT 04/30/18 2:44 PM  [facility] [address] [place], [place], [address] Phone: [phone]   Fax:  [phone]  Name: HOLLYNN GARNO MRN: [id number] Date of Birth: [birth year]

## 2018-05-04 ENCOUNTER — Encounter: Payer: Self-pay | Admitting: Internal Medicine

## 2018-05-07 ENCOUNTER — Ambulatory Visit: Payer: Federal, State, Local not specified - PPO

## 2018-05-07 DIAGNOSIS — M5441 Lumbago with sciatica, right side: Secondary | ICD-10-CM

## 2018-05-07 DIAGNOSIS — M6281 Muscle weakness (generalized): Secondary | ICD-10-CM | POA: Diagnosis not present

## 2018-05-07 NOTE — Therapy (Signed)
[facility] [address] [place], [place], [address] Phone: [phone]   Fax:  [phone]  Physical Therapy Treatment  Patient Details  Name: Pamela Novak MRN: [id number] Date of Birth: [birth year] Referring Provider: Dr. Scarlette Calico   Encounter Date: 05/07/2018  PT End of Session - 05/07/18 1446    Visit Number  3    Date for PT Re-Evaluation  06/21/18    Authorization Type  BCBS 50 visit limit    Authorization - Visit Number  3    Authorization - Number of Visits  50    PT Start Time  6433    PT Stop Time  1453    PT Time Calculation (min)  50 min    Activity Tolerance  Patient tolerated treatment well    Behavior During Therapy  [facility] for tasks assessed/performed       Past Medical History:  Diagnosis Date  . Allergy   . Anxiety   . Blood in stool   . Breast hematoma   . Complication of anesthesia    "woke up during colonoscopy"  . Depression   . GERD (gastroesophageal reflux disease)   . Hyperlipidemia   . Traumatic hematoma of knee     Past Surgical History:  Procedure Laterality Date  . ABLATION ON ENDOMETRIOSIS  10/04/2012   Procedure: ABLATION ON ENDOMETRIOSIS;  Surgeon: Linda Hedges, DO;  Location: [facility];  Service: Gynecology;  Laterality: N/A;  . COLONOSCOPY    . CONDYLOMA EXCISION/FULGURATION  10/04/2012   Procedure: CONDYLOMA REMOVAL;  Surgeon: Linda Hedges, DO;  Location: [facility];  Service: Gynecology;  Laterality: N/A;  . DILITATION & CURRETTAGE/HYSTROSCOPY WITH NOVASURE ABLATION  10/04/2012   Procedure: DILATATION & CURETTAGE/HYSTEROSCOPY WITH NOVASURE ABLATION;  Surgeon: Linda Hedges, DO;  Location: [facility];  Service: Gynecology;  Laterality: N/A;  . LAPAROSCOPY  10/04/2012   Procedure: LAPAROSCOPY OPERATIVE;  Surgeon: Linda Hedges, DO;  Location: [facility];  Service: Gynecology;  Laterality: N/A;    There were no vitals filed for this visit.  Subjective Assessment - 05/07/18 1400    Subjective  The traction gave me some relief for a day or 2.  I had to help my husband when he was choking and it strained my back.      Patient Stated Goals  pain to be completely gone     Currently in Pain?  Yes    Pain Score  4     Pain Location  Back    Pain Orientation  Right;Lower    Pain Descriptors / Indicators  Sore    Pain Type  Acute pain    Pain Onset  More than a month ago    Pain Frequency  Constant    Aggravating Factors   driving, stepping up    Pain Relieving Factors  traction helped briefly                       [facility] Adult PT Treatment/Exercise - 05/07/18 0001      Lumbar Exercises: Supine   Bridge  5 seconds;20 reps      Lumbar Exercises: Prone   Straight Leg Raise  20 reps    Other Prone Lumbar Exercises  on elbows: 3 minutes, press ups x 10      Knee/Hip Exercises: Supine   Bridges  Strengthening;Both;2 sets;10 reps      Modalities   Modalities  Traction      Traction  Type of Traction  Lumbar    Min (lbs)  50    Max (lbs)  80    Hold Time  60    Rest Time  20    Time  15      Manual Therapy   Manual Therapy  Joint mobilization    Joint Mobilization  PA mobs L1-5 grade 2- 30 second bouts             PT Education - 05/07/18 1418    Education Details   Access Code: [email or web address]     Person(s) Educated  Patient    Methods  Explanation    Comprehension  Verbalized understanding;Returned demonstration       PT Short Term Goals - 05/07/18 1404      PT SHORT TERM GOAL #1   Title  The patient will demonstrate knowledge of initial HEP, centralization, basic body mechanics training and postural correction     Time  4    Period  Weeks    Status  On-going        PT Long Term Goals - 04/26/18 2220      PT LONG TERM GOAL #1   Title  The patient will be independent in safe, self progression of HEP     Time  8    Period  Weeks    Status  New    Target Date  06/21/18      PT LONG TERM GOAL #2   Title  The patient will  report an overall 70% improvement in pain with usual ADLs    Time  8    Period  Weeks    Status  New      PT LONG TERM GOAL #3   Title  The patient will have improved lumbar ROM: flexion 45 degrees, extension to 20 degrees needed for mobility with ADLs    Time  8    Period  Weeks    Status  New      PT LONG TERM GOAL #4   Title  The patient will have improved right LE strength and core strength grossly 4+/5 needed for lifting medium objects and standing/walking for longer periods of time    Time  8    Period  Weeks    Status  New      PT LONG TERM GOAL #5   Title  FOTO functional outcome score improved from 59% limitation to 40% indicating improved function with less pain    Time  8    Period  Weeks    Status  New            Plan - 05/07/18 1431    Clinical Impression Statement  Pt reported reduced leg symptoms after traction last session.  Pt tolerated advancement of extension based exercise today.  Pt with reduced lumbar mobility.  Pt tolerated increased weight with traction today.  Pt will continue to benefit from skilled PT for traction and extension based exercise to address radiculopthy, modalities and core strength.      Rehab Potential  Good    PT Frequency  2x / week    PT Duration  8 weeks    PT Treatment/Interventions  ADLs/Self Care Home Management;Cryotherapy;Electrical Stimulation;Therapeutic activities;Therapeutic exercise;Ultrasound;Traction;Patient/family education;Manual techniques;Dry needling;Neuromuscular re-education;Taping;Moist Heat    PT Next Visit Plan  traction if helpful, dry needling to lumbar multifidi, core strength    PT Home Exercise Plan   Access Code: [email or web address]     Recommended  Other Services  initial certification is signed    Consulted and Agree with Plan of Care  Patient       Patient will benefit from skilled therapeutic intervention in order to improve the following deficits and impairments:  Pain, Decreased range of motion, Decreased  activity tolerance, Decreased strength, Impaired perceived functional ability  Visit Diagnosis: Acute right-sided low back pain with right-sided sciatica  Muscle weakness (generalized)     Problem List Patient Active Problem List   Diagnosis Date Noted  . Migraine without aura and with status migrainosus, not intractable 04/12/2018  . Bilateral low back pain 03/21/2018  . Lumbar disc herniation 03/21/2018  . Prediabetes 03/23/2017  . Seborrheic dermatitis of scalp 03/23/2017  . Hyperlipidemia 01/21/2016  . Depression 01/18/2016  . PTSD (post-traumatic stress disorder) 01/18/2016  . GERD (gastroesophageal reflux disease) 01/18/2016  . Varicose veins 05/21/2013  . GAD (generalized anxiety disorder) 11/02/2012    Sigurd Sos, PT 05/07/18 2:48 PM  [facility] [address] [place], [place], [address] Phone: [phone]   Fax:  [phone]  Name: VANIAH CHAMBERS MRN: [id number] Date of Birth: 1971-09-08

## 2018-05-07 NOTE — Patient Instructions (Signed)
Access Code: [email or web address]  URL: [email or web address]  Date: 05/07/2018  Prepared by: Sigurd Sos   Exercises  Supine Bridge - 10 reps - 2 sets - 5 hold - 2x daily - 7x weekly  Prone Hip Extension - Two Pillows - 10 reps - 2 sets - 2x daily - 7x weekly  Prone Press Up on Elbows - 10 reps - 1 sets - 2x daily - 7x weekly  Standing Lumbar Extension - 10 reps - 1 sets - 3x daily - 7x weekly

## 2018-05-09 ENCOUNTER — Ambulatory Visit: Payer: Federal, State, Local not specified - PPO

## 2018-05-09 DIAGNOSIS — M6281 Muscle weakness (generalized): Secondary | ICD-10-CM

## 2018-05-09 DIAGNOSIS — M5441 Lumbago with sciatica, right side: Secondary | ICD-10-CM

## 2018-05-09 NOTE — Therapy (Signed)
[facility] [address] [place], [place], [address] Phone: [phone]   Fax:  [phone]  Physical Therapy Treatment  Patient Details  Name: Pamela Novak MRN: [id number] Date of Birth: 10-31-1971 Referring Provider: Dr. Scarlette Calico   Encounter Date: 05/09/2018  PT End of Session - 05/09/18 1525    Visit Number  4    Date for PT Re-Evaluation  06/21/18    Authorization Type  BCBS 50 visit limit    Authorization - Visit Number  4    Authorization - Number of Visits  50    PT Start Time  0973    PT Stop Time  1534    PT Time Calculation (min)  48 min    Activity Tolerance  Patient tolerated treatment well    Behavior During Therapy  [facility] for tasks assessed/performed       Past Medical History:  Diagnosis Date  . Allergy   . Anxiety   . Blood in stool   . Breast hematoma   . Complication of anesthesia    "woke up during colonoscopy"  . Depression   . GERD (gastroesophageal reflux disease)   . Hyperlipidemia   . Traumatic hematoma of knee     Past Surgical History:  Procedure Laterality Date  . ABLATION ON ENDOMETRIOSIS  10/04/2012   Procedure: ABLATION ON ENDOMETRIOSIS;  Surgeon: Linda Hedges, DO;  Location: [facility];  Service: Gynecology;  Laterality: N/A;  . COLONOSCOPY    . CONDYLOMA EXCISION/FULGURATION  10/04/2012   Procedure: CONDYLOMA REMOVAL;  Surgeon: Linda Hedges, DO;  Location: [facility];  Service: Gynecology;  Laterality: N/A;  . DILITATION & CURRETTAGE/HYSTROSCOPY WITH NOVASURE ABLATION  10/04/2012   Procedure: DILATATION & CURETTAGE/HYSTEROSCOPY WITH NOVASURE ABLATION;  Surgeon: Linda Hedges, DO;  Location: [facility] [facility];  Service: Gynecology;  Laterality: N/A;  . LAPAROSCOPY  10/04/2012   Procedure: LAPAROSCOPY OPERATIVE;  Surgeon: Linda Hedges, DO;  Location: [facility];  Service: Gynecology;  Laterality: N/A;    There were no vitals filed for this visit.  Subjective Assessment - 05/09/18 1438     Subjective  The soreness is in my buttock and not as far down my leg.  My back is tight and sore all the time.      Currently in Pain?  Yes    Pain Score  3  with tylenol    Pain Location  Back    Pain Orientation  Right                       [facility] Adult PT Treatment/Exercise - 05/09/18 0001      Modalities   Modalities  Moist Heat      Moist Heat Therapy   Number Minutes Moist Heat  15 Minutes    Moist Heat Location  Lumbar Spine      Manual Therapy   Manual Therapy  Soft tissue mobilization;Myofascial release    Manual therapy comments  bil lumbar paraspinals and Rt gluteals with trigger point release at Rt gluteals       Trigger Point Dry Needling - 05/09/18 1450    Consent Given?  Yes    Education Handout Provided  Yes    Muscles Treated Lower Body  Gluteus minimus;Gluteus maximus;Piriformis Rt gluteals and bil lumbar multifidi    Gluteus Maximus Response  Twitch response elicited;Palpable increased muscle length    Gluteus Minimus Response  Palpable increased muscle length;Twitch response elicited  Piriformis Response  Twitch response elicited;Palpable increased muscle length           PT Education - 05/09/18 1452    Education Details  DN info    Person(s) Educated  Patient    Methods  Explanation;Handout    Comprehension  Verbalized understanding       PT Short Term Goals - 05/07/18 1404      PT SHORT TERM GOAL #1   Title  The patient will demonstrate knowledge of initial HEP, centralization, basic body mechanics training and postural correction     Time  4    Period  Weeks    Status  On-going        PT Long Term Goals - 04/26/18 2220      PT LONG TERM GOAL #1   Title  The patient will be independent in safe, self progression of HEP     Time  8    Period  Weeks    Status  New    Target Date  06/21/18      PT LONG TERM GOAL #2   Title  The patient will report an overall 70% improvement in pain with usual ADLs    Time  8     Period  Weeks    Status  New      PT LONG TERM GOAL #3   Title  The patient will have improved lumbar ROM: flexion 45 degrees, extension to 20 degrees needed for mobility with ADLs    Time  8    Period  Weeks    Status  New      PT LONG TERM GOAL #4   Title  The patient will have improved right LE strength and core strength grossly 4+/5 needed for lifting medium objects and standing/walking for longer periods of time    Time  8    Period  Weeks    Status  New      PT LONG TERM GOAL #5   Title  FOTO functional outcome score improved from 59% limitation to 40% indicating improved function with less pain    Time  8    Period  Weeks    Status  New            Plan - 05/09/18 1522    Clinical Impression Statement  Session today focused on dry needling and manual therapy to address tension, trigger points and pain the lumbar paraspinals and Rt gluteals.  Pt with trigger points in Rt gluteals and tension Rt>Lt and demonstrated improved tissue mobility and reduced pain s/p dry needling today.  Additional treatment not provided today as pt reports that she has a sensitive system and takes longer to recover from things.  Pt will continue with extension based exercise at home.  Pt withh continue to benefit from skilled PT to address LBP and Rt LE radiculopathy.      Rehab Potential  Good    Clinical Impairments Affecting Rehab Potential  anxiety     PT Frequency  2x / week    PT Duration  8 weeks    PT Treatment/Interventions  ADLs/Self Care Home Management;Cryotherapy;Electrical Stimulation;Therapeutic activities;Therapeutic exercise;Ultrasound;Traction;Patient/family education;Manual techniques;Dry needling;Neuromuscular re-education;Taping;Moist Heat    PT Next Visit Plan  traction if helpful, assess response to dry needling to lumbar multifidi and Rt gluteals, core strength    PT Home Exercise Plan   Access Code: [email or web address]     Consulted and Agree with Plan of Care  Patient  Patient will benefit from skilled therapeutic intervention in order to improve the following deficits and impairments:  Pain, Decreased range of motion, Decreased activity tolerance, Decreased strength, Impaired perceived functional ability  Visit Diagnosis: Acute right-sided low back pain with right-sided sciatica  Muscle weakness (generalized)     Problem List Patient Active Problem List   Diagnosis Date Noted  . Migraine without aura and with status migrainosus, not intractable 04/12/2018  . Bilateral low back pain 03/21/2018  . Lumbar disc herniation 03/21/2018  . Prediabetes 03/23/2017  . Seborrheic dermatitis of scalp 03/23/2017  . Hyperlipidemia 01/21/2016  . Depression 01/18/2016  . PTSD (post-traumatic stress disorder) 01/18/2016  . GERD (gastroesophageal reflux disease) 01/18/2016  . Varicose veins 05/21/2013  . GAD (generalized anxiety disorder) 11/02/2012     Sigurd Sos, PT 05/09/18 3:27 PM  [facility] [address] [place], [place], [address] Phone: [phone]   Fax:  [phone]  Name: DEAZIA LAMPI MRN: [id number] Date of Birth: 05/12/1971

## 2018-05-09 NOTE — Patient Instructions (Signed)

## 2018-05-13 NOTE — Patient Instructions (Addendum)
  Test(s) ordered today. Your results will be released to [facility] (or called to you) after review, usually within 72hours after test completion. If any changes need to be made, you will be notified at that same time.   Medications reviewed and updated.  Changes include starting gabapentin at night.    Your prescription(s) have been submitted to your pharmacy. Please take as directed and contact our office if you believe you are having problem(s) with the medication(s).    Please followup in 6 months

## 2018-05-13 NOTE — Progress Notes (Signed)
Subjective:    Patient ID: Pamela Novak, female    DOB: 09/13/1971, 47 y.o.   MRN: [id number]  HPI The patient is here for follow up.  Migraines headaches:  She was given zomig with a migraine here and it helped.  She has not needed to use the prescription.    GERD:  She is taking her medication daily as prescribed.  She denies any GERD symptoms and feels her GERD is well controlled.   Prediabetes:  She is not as compliant with a low sugar/carbohydrate diet.  She is not exercising regularly due to her current acute back pain, but she is doing physical therapy.  Hyperlipidemia: She is taking her medication daily, but admits that she did run out 1 week ago. She is compliant with a low fat/cholesterol diet. She is not exercising regularly due to her back pain. She denies myalgias.   Lumbar radiculopathy:  She is still having lower back pain and right buttock pain that radiates down her right leg.  She saw Dr. Ronnald Ramp for this previously.  She is doing physical therapy.  She denies numbness/tingling.  She is not able to take any NSAIDs and wondered about taking gabapentin-she has never taken this in the past, but her mother has.  Medications and allergies reviewed with patient and updated if appropriate.  Patient Active Problem List   Diagnosis Date Noted  . Migraine without aura and with status migrainosus, not intractable 04/12/2018  . Bilateral low back pain 03/21/2018  . Lumbar disc herniation 03/21/2018  . Prediabetes 03/23/2017  . Seborrheic dermatitis of scalp 03/23/2017  . Hyperlipidemia 01/21/2016  . Depression 01/18/2016  . PTSD (post-traumatic stress disorder) 01/18/2016  . GERD (gastroesophageal reflux disease) 01/18/2016  . Varicose veins 05/21/2013  . GAD (generalized anxiety disorder) 11/02/2012    Current Outpatient Medications on File Prior to Visit  Medication Sig Dispense Refill  . atorvastatin (LIPITOR) 20 MG tablet Take 1 tablet (20 mg total) by mouth daily.  -- Office visit needed for further refills 90 tablet 0  . buPROPion (WELLBUTRIN XL) 300 MG 24 hr tablet Take 300 mg by mouth daily.    [name] Kitchen CARAFATE 1 GM/10ML suspension 10MLS BY MOUTH 4 TIMES A DAY WITH MEALS AND AT BEDTIME (Patient taking differently: 10MLS BY MOUTH once  a week  WITH MEALS in the evening) 420 mL 0  . ketoconazole (NIZORAL) 2 % shampoo Apply 1 application topically 2 (two) times a week. 360 mL 0  . lidocaine (XYLOCAINE) 2 % solution 10 ml 3 times daily as needed for throat pain. Swish and swallow. 100 mL 0  . linaclotide (LINZESS) 72 MCG capsule Take 1 capsule (72 mcg total) by mouth daily before breakfast. 90 capsule 3  . pantoprazole (PROTONIX) 40 MG tablet Take 1 tablet (40 mg total) by mouth daily. 90 tablet 0  . ranitidine (ZANTAC) 150 MG tablet Take 1 tablet (150 mg total) by mouth at bedtime. -- Office visit needed for further refills 90 tablet 0  . sertraline (ZOLOFT) 50 MG tablet Take 75 mg daily by mouth. Take 1.5 tablets twice daily.     [name] Kitchen zolmitriptan (ZOMIG) 5 MG nasal solution Place 1 spray into the nose as needed for migraine. 6 Units 3   No current facility-administered medications on file prior to visit.     Past Medical History:  Diagnosis Date  . Allergy   . Anxiety   . Blood in stool   . Breast hematoma   .  Complication of anesthesia    "woke up during colonoscopy"  . Depression   . GERD (gastroesophageal reflux disease)   . Hyperlipidemia   . Traumatic hematoma of knee     Past Surgical History:  Procedure Laterality Date  . ABLATION ON ENDOMETRIOSIS  10/04/2012   Procedure: ABLATION ON ENDOMETRIOSIS;  Surgeon: Linda Hedges, DO;  Location: [facility];  Service: Gynecology;  Laterality: N/A;  . COLONOSCOPY    . CONDYLOMA EXCISION/FULGURATION  10/04/2012   Procedure: CONDYLOMA REMOVAL;  Surgeon: Linda Hedges, DO;  Location: [facility];  Service: Gynecology;  Laterality: N/A;  . DILITATION & CURRETTAGE/HYSTROSCOPY WITH NOVASURE ABLATION  10/04/2012    Procedure: DILATATION & CURETTAGE/HYSTEROSCOPY WITH NOVASURE ABLATION;  Surgeon: Linda Hedges, DO;  Location: [facility];  Service: Gynecology;  Laterality: N/A;  . LAPAROSCOPY  10/04/2012   Procedure: LAPAROSCOPY OPERATIVE;  Surgeon: Linda Hedges, DO;  Location: [facility];  Service: Gynecology;  Laterality: N/A;    Social History   Socioeconomic History  . Marital status: Married    Spouse name: Not on file  . Number of children: 2  . Years of education: 12+  . Highest education level: Not on file  Occupational History  . Occupation: adm Radiation protection practitioner: [employer]  . Financial resource strain: Not on file  . Food insecurity:    Worry: Not on file    Inability: Not on file  . Transportation needs:    Medical: Not on file    Non-medical: Not on file  Tobacco Use  . Smoking status: Never Smoker  . Smokeless tobacco: Never Used  Substance and Sexual Activity  . Alcohol use: Yes    Comment: rare  . Drug use: No  . Sexual activity: Yes  Lifestyle  . Physical activity:    Days per week: Not on file    Minutes per session: Not on file  . Stress: Not on file  Relationships  . Social connections:    Talks on phone: Not on file    Gets together: Not on file    Attends religious service: Not on file    Active member of club or organization: Not on file    Attends meetings of clubs or organizations: Not on file    Relationship status: Not on file  Other Topics Concern  . Not on file  Social History Narrative   Regular exercise-no   Caffeine Use-yes    Family History  Problem Relation Age of Onset  . Heart disease Mother   . Arthritis Mother   . Hyperlipidemia Mother   . Hypertension Mother   . Heart attack Mother   . Cancer Father        Colon Cancer-Polyps  . Hypertension Father   . Hashimoto's thyroiditis Sister   . Diabetes Sister   . Thyroid disease Sister   . Cancer Paternal Grandfather        Colon Cancer  . Hashimoto's thyroiditis  Sister   . Thyroid disease Sister     Review of Systems  Constitutional: Negative for chills and fever.  Respiratory: Positive for cough. Negative for shortness of breath and wheezing.   Cardiovascular: Positive for palpitations (occ). Negative for chest pain and leg swelling.  Neurological: Positive for headaches (migraines). Negative for dizziness and light-headedness.       Objective:   Vitals:   05/15/18 1324  BP: 128/84  Pulse: 94  Resp: 16  Temp: 98.4 F (36.9 C)  SpO2: 98%   BP Readings from Last 3 Encounters:  05/15/18 128/84  04/12/18 110/80  03/21/18 130/82   Wt Readings from Last 3 Encounters:  05/15/18 178 lb (80.7 kg)  04/12/18 175 lb (79.4 kg)  03/21/18 173 lb (78.5 kg)   Body mass index is 27.88 kg/m.   Physical Exam    Constitutional: Appears well-developed and well-nourished. No distress.  HENT:  Head: Normocephalic and atraumatic.  Neck: Neck supple. No tracheal deviation present. No thyromegaly present.  No cervical lymphadenopathy Cardiovascular: Normal rate, regular rhythm and normal heart sounds.   No murmur heard. No carotid bruit .  No edema Pulmonary/Chest: Effort normal and breath sounds normal. No respiratory distress. No has no wheezes. No rales.  Neuro: normal strength and sensation b/l LE Skin: Skin is warm and dry. Not diaphoretic.  Psychiatric: Normal mood and affect. Behavior is normal.      Assessment & Plan:    See Problem List for Assessment and Plan of chronic medical problems.

## 2018-05-15 ENCOUNTER — Encounter: Payer: Self-pay | Admitting: Internal Medicine

## 2018-05-15 ENCOUNTER — Ambulatory Visit: Payer: Federal, State, Local not specified - PPO | Admitting: Physical Therapy

## 2018-05-15 ENCOUNTER — Other Ambulatory Visit (INDEPENDENT_AMBULATORY_CARE_PROVIDER_SITE_OTHER): Payer: Federal, State, Local not specified - PPO

## 2018-05-15 ENCOUNTER — Ambulatory Visit (INDEPENDENT_AMBULATORY_CARE_PROVIDER_SITE_OTHER): Payer: Federal, State, Local not specified - PPO | Admitting: Internal Medicine

## 2018-05-15 ENCOUNTER — Encounter: Payer: Self-pay | Admitting: Physical Therapy

## 2018-05-15 VITALS — BP 128/84 | HR 94 | Temp 98.4°F | Resp 16 | Wt 178.0 lb

## 2018-05-15 DIAGNOSIS — K219 Gastro-esophageal reflux disease without esophagitis: Secondary | ICD-10-CM

## 2018-05-15 DIAGNOSIS — E7849 Other hyperlipidemia: Secondary | ICD-10-CM | POA: Diagnosis not present

## 2018-05-15 DIAGNOSIS — G43001 Migraine without aura, not intractable, with status migrainosus: Secondary | ICD-10-CM | POA: Diagnosis not present

## 2018-05-15 DIAGNOSIS — R7303 Prediabetes: Secondary | ICD-10-CM

## 2018-05-15 DIAGNOSIS — M5441 Lumbago with sciatica, right side: Secondary | ICD-10-CM | POA: Diagnosis not present

## 2018-05-15 DIAGNOSIS — M6281 Muscle weakness (generalized): Secondary | ICD-10-CM | POA: Diagnosis not present

## 2018-05-15 DIAGNOSIS — M5126 Other intervertebral disc displacement, lumbar region: Secondary | ICD-10-CM | POA: Diagnosis not present

## 2018-05-15 LAB — LIPID PANEL
Cholesterol: 198 mg/dL (ref 0–200)
HDL: 42.5 mg/dL (ref >39.00)
LDL Cholesterol: 132 mg/dL — ABNORMAL HIGH (ref 0–99)
NonHDL: 155.01
Total CHOL/HDL Ratio: 5
Triglycerides: 115 mg/dL (ref 0.0–149.0)
VLDL: 23 mg/dL (ref 0.0–40.0)

## 2018-05-15 LAB — COMPREHENSIVE METABOLIC PANEL
ALT: 19 U/L (ref 0–35)
AST: 17 U/L (ref 0–37)
Albumin: 4.5 g/dL (ref 3.5–5.2)
Alkaline Phosphatase: 44 U/L (ref 39–117)
BUN: 13 mg/dL (ref 6–23)
CO2: 28 mEq/L (ref 19–32)
Calcium: 9.3 mg/dL (ref 8.4–10.5)
Chloride: 105 mEq/L (ref 96–112)
Creatinine, Ser: 0.97 mg/dL (ref 0.40–1.20)
GFR: 64.96 mL/min (ref >60.00)
Glucose, Bld: 104 mg/dL — ABNORMAL HIGH (ref 70–99)
Potassium: 4 mEq/L (ref 3.5–5.1)
Sodium: 139 mEq/L (ref 135–145)
Total Bilirubin: 0.5 mg/dL (ref 0.2–1.2)
Total Protein: 7.1 g/dL (ref 6.0–8.3)

## 2018-05-15 LAB — HEMOGLOBIN A1C: Hgb A1c MFr Bld: 6.1 % (ref 4.6–6.5)

## 2018-05-15 MED ORDER — PANTOPRAZOLE SODIUM 40 MG PO TBEC: 40.0000 mg | DELAYED_RELEASE_TABLET | Freq: Every day | ORAL | 3 refills | Status: DC

## 2018-05-15 MED ORDER — ATORVASTATIN CALCIUM 20 MG PO TABS: 20.0000 mg | ORAL_TABLET | Freq: Every day | ORAL | 3 refills | Status: DC

## 2018-05-15 MED ORDER — GABAPENTIN 100 MG PO CAPS: 100.0000 mg | ORAL_CAPSULE | Freq: Every day | ORAL | 1 refills | Status: DC

## 2018-05-15 MED ORDER — RANITIDINE HCL 150 MG PO TABS: 150.0000 mg | ORAL_TABLET | Freq: Every day | ORAL | 3 refills | Status: DC

## 2018-05-15 NOTE — Assessment & Plan Note (Signed)
Saw Dr Larwance Rote Pt Still having radiculopathy down right leg Will start gabapentin at bedtime - can titrate if tolerated

## 2018-05-15 NOTE — Assessment & Plan Note (Signed)
zomig nasal prn  - very effective Gets migraines 1-2 / month

## 2018-05-15 NOTE — Assessment & Plan Note (Addendum)
Check lipid panel  Continue daily statin-has been off of it for 1 week because she needed a refill Regular exercise and healthy diet encouraged

## 2018-05-15 NOTE — Assessment & Plan Note (Addendum)
GERD controlled but needs medication-if she delays in taking it she will become symptomatic Continue daily medication No longer having a sore throat, no hoarseness

## 2018-05-15 NOTE — Assessment & Plan Note (Signed)
Check a1c Low sugar / carb diet Stressed regular exercise   

## 2018-05-15 NOTE — Therapy (Signed)
[facility] [address], [place], [place], [address] Phone: [phone]   Fax:  [phone]  Physical Therapy Treatment  Patient Details  Name: Pamela Novak MRN: [id number] Date of Birth: January 06, 1971 Referring Provider: Dr. Scarlette Calico   Encounter Date: 05/15/2018  PT End of Session - 05/15/18 1711    Visit Number  5    Date for PT Re-Evaluation  06/21/18    Authorization Type  BCBS 50 visit limit    Authorization - Number of Visits  50    PT Start Time  1618    PT Stop Time  1705    PT Time Calculation (min)  47 min    Activity Tolerance  Patient tolerated treatment well       Past Medical History:  Diagnosis Date  . Allergy   . Anxiety   . Blood in stool   . Breast hematoma   . Complication of anesthesia    "woke up during colonoscopy"  . Depression   . GERD (gastroesophageal reflux disease)   . Hyperlipidemia   . Traumatic hematoma of knee     Past Surgical History:  Procedure Laterality Date  . ABLATION ON ENDOMETRIOSIS  10/04/2012   Procedure: ABLATION ON ENDOMETRIOSIS;  Surgeon: Linda Hedges, DO;  Location: [facility];  Service: Gynecology;  Laterality: N/A;  . COLONOSCOPY    . CONDYLOMA EXCISION/FULGURATION  10/04/2012   Procedure: CONDYLOMA REMOVAL;  Surgeon: Linda Hedges, DO;  Location: [facility];  Service: Gynecology;  Laterality: N/A;  . DILITATION & CURRETTAGE/HYSTROSCOPY WITH NOVASURE ABLATION  10/04/2012   Procedure: DILATATION & CURETTAGE/HYSTEROSCOPY WITH NOVASURE ABLATION;  Surgeon: Linda Hedges, DO;  Location: [facility];  Service: Gynecology;  Laterality: N/A;  . LAPAROSCOPY  10/04/2012   Procedure: LAPAROSCOPY OPERATIVE;  Surgeon: Linda Hedges, DO;  Location: [facility];  Service: Gynecology;  Laterality: N/A;    There were no vitals filed for this visit.  Subjective Assessment - 05/15/18 1622    Subjective  The dry needling has helped more than anything.  Less leg pain.  The extension exercises  do seem to flare me up though.      Currently in Pain?  Yes    Pain Score  4     Pain Location  Back                       [facility] Adult PT Treatment/Exercise - 05/15/18 0001      Lumbar Exercises: Standing   Other Standing Lumbar Exercises  standing lumbar extension x 5    Other Standing Lumbar Exercises  right wall side glide 10x       Moist Heat Therapy   Number Minutes Moist Heat  5 Minutes    Moist Heat Location  Hip      Manual Therapy   Manual therapy comments  bil lumbar paraspinals and Rt gluteals with trigger point release at Rt gluteals    Joint Mobilization  right hip long axis distraction, inferior, AP in internal rotation grade 3/4 3x 20 sec each    Muscle Energy Technique  piriformis contract-relax 3x 5 sec hold right    Kinesiotex  Facilitate Muscle      Kinesiotix   Facilitate Muscle   asterik star lumbar       Trigger Point Dry Needling - 05/15/18 1710    Consent Given?  Yes    Muscles Treated Lower Body  -- bil lumbar multifid  Gluteus Maximus Response  Twitch response elicited;Palpable increased muscle length    Gluteus Minimus Response  Twitch response elicited;Palpable increased muscle length    Piriformis Response  Palpable increased muscle length           PT Education - 05/15/18 1711    Education Details  right wall side glide   Access Code: [email or web address] forgot to give handout    Person(s) Educated  Patient    Methods  Explanation;Demonstration    Comprehension  Verbalized understanding;Returned demonstration       PT Short Term Goals - 05/07/18 1404      PT SHORT TERM GOAL #1   Title  The patient will demonstrate knowledge of initial HEP, centralization, basic body mechanics training and postural correction     Time  4    Period  Weeks    Status  On-going        PT Long Term Goals - 04/26/18 2220      PT LONG TERM GOAL #1   Title  The patient will be independent in safe, self progression of HEP     Time  8    Period   Weeks    Status  New    Target Date  06/21/18      PT LONG TERM GOAL #2   Title  The patient will report an overall 70% improvement in pain with usual ADLs    Time  8    Period  Weeks    Status  New      PT LONG TERM GOAL #3   Title  The patient will have improved lumbar ROM: flexion 45 degrees, extension to 20 degrees needed for mobility with ADLs    Time  8    Period  Weeks    Status  New      PT LONG TERM GOAL #4   Title  The patient will have improved right LE strength and core strength grossly 4+/5 needed for lifting medium objects and standing/walking for longer periods of time    Time  8    Period  Weeks    Status  New      PT LONG TERM GOAL #5   Title  FOTO functional outcome score improved from 59% limitation to 40% indicating improved function with less pain    Time  8    Period  Weeks    Status  New            Plan - 05/15/18 1711    Clinical Impression Statement  The patient reports a good overall response to initial DN and is receptive continuing.  Symptoms centralized to right buttock and lumbar region now.  Unable to fully centralize so added lateral component side glides.  Fewer overall tender points.  Therapist closely monitoring response with all interventions.  On track to meet STGs next week.      Rehab Potential  Good    Clinical Impairments Affecting Rehab Potential  anxiety     PT Frequency  2x / week    PT Duration  8 weeks    PT Treatment/Interventions  ADLs/Self Care Home Management;Cryotherapy;Electrical Stimulation;Therapeutic activities;Therapeutic exercise;Ultrasound;Traction;Patient/family education;Manual techniques;Dry needling;Neuromuscular re-education;Taping;Moist Heat    PT Next Visit Plan   assess response to dry needling to lumbar multifidi and Rt gluteals #2;  assess response to KT;  assess response to lateral wall side glides;  core strength    PT Home Exercise Plan   Access Code: [email or web address] handout  in therapist drawer give next visit         Patient will benefit from skilled therapeutic intervention in order to improve the following deficits and impairments:  Pain, Decreased range of motion, Decreased activity tolerance, Decreased strength, Impaired perceived functional ability  Visit Diagnosis: Acute right-sided low back pain with right-sided sciatica  Muscle weakness (generalized)     Problem List Patient Active Problem List   Diagnosis Date Noted  . Migraine without aura and with status migrainosus, not intractable 04/12/2018  . Bilateral low back pain 03/21/2018  . Lumbar disc herniation 03/21/2018  . Prediabetes 03/23/2017  . Seborrheic dermatitis of scalp 03/23/2017  . Hyperlipidemia 01/21/2016  . Depression 01/18/2016  . PTSD (post-traumatic stress disorder) 01/18/2016  . GERD (gastroesophageal reflux disease) 01/18/2016  . Varicose veins 05/21/2013  . GAD (generalized anxiety disorder) 11/02/2012   Ruben Im, PT 05/15/18 5:16 PM Phone: [phone] Fax: [phone]  Alvera Singh 05/15/2018, 5:16 PM  [facility] [address], [place], [place], [address] Phone: [phone]   Fax:  [phone]  Name: Pamela Novak MRN: [id number] Date of Birth: 1970-11-16

## 2018-05-15 NOTE — Patient Instructions (Signed)
Pamela Novak PT [facility] [address], [address] [place], [place] [address] Phone # [phone] Fax [phone]    

## 2018-05-17 ENCOUNTER — Ambulatory Visit: Payer: Federal, State, Local not specified - PPO | Admitting: Physical Therapy

## 2018-05-17 ENCOUNTER — Encounter: Payer: Self-pay | Admitting: Internal Medicine

## 2018-05-17 ENCOUNTER — Encounter: Payer: Self-pay | Admitting: Physical Therapy

## 2018-05-17 DIAGNOSIS — M5441 Lumbago with sciatica, right side: Secondary | ICD-10-CM

## 2018-05-17 DIAGNOSIS — M6281 Muscle weakness (generalized): Secondary | ICD-10-CM

## 2018-05-17 NOTE — Therapy (Signed)
[facility] [address] [place], [place], [address] Phone: [phone]   Fax:  [phone]  Physical Therapy Treatment  Patient Details  Name: Pamela Novak MRN: [id number] Date of Birth: Nov 13, 1970 Referring Provider: Dr. Scarlette Calico   Encounter Date: 05/17/2018  PT End of Session - 05/17/18 1549    Visit Number  6    Date for PT Re-Evaluation  06/21/18    Authorization Type  BCBS 50 visit limit    Authorization - Number of Visits  50    PT Start Time  1400    PT Stop Time  1445    PT Time Calculation (min)  45 min    Activity Tolerance  Patient tolerated treatment well       Past Medical History:  Diagnosis Date  . Allergy   . Anxiety   . Blood in stool   . Breast hematoma   . Complication of anesthesia    "woke up during colonoscopy"  . Depression   . GERD (gastroesophageal reflux disease)   . Hyperlipidemia   . Traumatic hematoma of knee     Past Surgical History:  Procedure Laterality Date  . ABLATION ON ENDOMETRIOSIS  10/04/2012   Procedure: ABLATION ON ENDOMETRIOSIS;  Surgeon: Linda Hedges, DO;  Location: [facility];  Service: Gynecology;  Laterality: N/A;  . COLONOSCOPY    . CONDYLOMA EXCISION/FULGURATION  10/04/2012   Procedure: CONDYLOMA REMOVAL;  Surgeon: Linda Hedges, DO;  Location: [facility];  Service: Gynecology;  Laterality: N/A;  . DILITATION & CURRETTAGE/HYSTROSCOPY WITH NOVASURE ABLATION  10/04/2012   Procedure: DILATATION & CURETTAGE/HYSTEROSCOPY WITH NOVASURE ABLATION;  Surgeon: Linda Hedges, DO;  Location: [facility];  Service: Gynecology;  Laterality: N/A;  . LAPAROSCOPY  10/04/2012   Procedure: LAPAROSCOPY OPERATIVE;  Surgeon: Linda Hedges, DO;  Location: [facility];  Service: Gynecology;  Laterality: N/A;    There were no vitals filed for this visit.  Subjective Assessment - 05/17/18 1401    Subjective  I 'm still pretty sore from the DN.  Started on Gabapentin for night time.  Pain to mid  posterior thigh but mostly buttock.  Had some aching in foot yesterday but I'm not sure what that is.       Currently in Pain?  Yes    Pain Score  3     Pain Location  Hip    Pain Orientation  Right    Pain Type  Acute pain    Aggravating Factors   sit for a while                       [facility] Adult PT Treatment/Exercise - 05/17/18 0001      Lumbar Exercises: Stretches   Standing Extension  5 reps    Other Lumbar Stretch Exercise  seated plantar fascia stretch on right      Lumbar Exercises: Standing   Heel Raises  15 reps    Other Standing Lumbar Exercises  standing rocker board 15x    Other Standing Lumbar Exercises  hip hinge with sit to stand 8x      Lumbar Exercises: Supine   Ab Set  10 reps    Bent Knee Raise  10 reps    Isometric Hip Flexion  10 reps      Lumbar Exercises: Quadruped   Madcat/Old Horse  10 reps    Single Arm Raise  Right;Left;5 reps    Straight Leg Raise  5 reps    Opposite Arm/Leg Raise  Right arm/Left leg;Left arm/Right leg;5 reps    Other Quadruped Lumbar Exercises  tall kneeling hip hinge 5x             PT Education - 05/17/18 1446    Education Details   Access Code: [email or web address] bird dogs, cat/camel with childs pose; supine abdominal brace with hand to knee push, bent knee lifts    Person(s) Educated  Patient    Methods  Explanation;Demonstration;Handout    Comprehension  Returned demonstration;Verbalized understanding       PT Short Term Goals - 05/17/18 1550      PT SHORT TERM GOAL #1   Title  The patient will demonstrate knowledge of initial HEP, centralization, basic body mechanics training and postural correction     Time  4    Period  Weeks    Status  On-going      PT SHORT TERM GOAL #2   Title  The patient will have improved lumbar flexion to 30 degrees and extension to 15 degrees needed for improved mobility for ADLs    Time  4    Period  Weeks    Status  On-going      PT SHORT TERM GOAL #3   Title  The  patient will have centralization of symptoms 50% of the time    Time  4    Period  Weeks    Status  On-going      PT SHORT TERM GOAL #4   Title  The patient will demonstrate improved mobility, strength  and body mechanics to lift light to medium objects from the floor     Time  4    Period  Weeks    Status  On-going        PT Long Term Goals - 04/26/18 2220      PT LONG TERM GOAL #1   Title  The patient will be independent in safe, self progression of HEP     Time  8    Period  Weeks    Status  New    Target Date  06/21/18      PT LONG TERM GOAL #2   Title  The patient will report an overall 70% improvement in pain with usual ADLs    Time  8    Period  Weeks    Status  New      PT LONG TERM GOAL #3   Title  The patient will have improved lumbar ROM: flexion 45 degrees, extension to 20 degrees needed for mobility with ADLs    Time  8    Period  Weeks    Status  New      PT LONG TERM GOAL #4   Title  The patient will have improved right LE strength and core strength grossly 4+/5 needed for lifting medium objects and standing/walking for longer periods of time    Time  8    Period  Weeks    Status  New      PT LONG TERM GOAL #5   Title  FOTO functional outcome score improved from 59% limitation to 40% indicating improved function with less pain    Time  8    Period  Weeks    Status  New            Plan - 05/17/18 1550    Clinical Impression Statement  The patient reports symptoms are centralized the  majority of the time but has had some distal symptoms intermittently.  Instructed in a progression of hip mobility and hip hinging exercise in addition to a progression of core strengthening in supine and quadruped.  Verbal and tactile cues for transverse abdominus activation and to avoid right side pelvic drop in quadruped.      Rehab Potential  Good    Clinical Impairments Affecting Rehab Potential  anxiety     PT Frequency  2x / week    PT Duration  8 weeks     PT Treatment/Interventions  ADLs/Self Care Home Management;Cryotherapy;Electrical Stimulation;Therapeutic activities;Therapeutic exercise;Ultrasound;Traction;Patient/family education;Manual techniques;Dry needling;Neuromuscular re-education;Taping;Moist Heat    PT Next Visit Plan   assess response to dry needling to lumbar multifidi and Rt gluteals #2 and continue if beneficial;   assess response to KT;  add neural flossing;   core strength;  assess STGs next week     PT Home Exercise Plan   Access Code: [email or web address]        Patient will benefit from skilled therapeutic intervention in order to improve the following deficits and impairments:  Pain, Decreased range of motion, Decreased activity tolerance, Decreased strength, Impaired perceived functional ability  Visit Diagnosis: Acute right-sided low back pain with right-sided sciatica  Muscle weakness (generalized)     Problem List Patient Active Problem List   Diagnosis Date Noted  . Migraine without aura and with status migrainosus, not intractable 04/12/2018  . Bilateral low back pain 03/21/2018  . Lumbar disc herniation 03/21/2018  . Prediabetes 03/23/2017  . Seborrheic dermatitis of scalp 03/23/2017  . Hyperlipidemia 01/21/2016  . Depression 01/18/2016  . PTSD (post-traumatic stress disorder) 01/18/2016  . GERD (gastroesophageal reflux disease) 01/18/2016  . Varicose veins 05/21/2013  . GAD (generalized anxiety disorder) 11/02/2012   Ruben Im, PT 05/17/18 3:56 PM Phone: [phone] Fax: [phone]  Alvera Singh 05/17/2018, 3:56 PM  [facility] [address], [place], [place], [address] Phone: [phone]   Fax:  [phone]  Name: Pamela Novak MRN: [id number] Date of Birth: July 16, 1971

## 2018-05-17 NOTE — Patient Instructions (Signed)
  Access Code: [email or web address]  URL: [email or web address]  Date: 05/17/2018  Prepared by: Ruben Im   Exercises  Supine Bridge - 10 reps - 2 sets - 5 hold - 2x daily - 7x weekly  Prone Hip Extension - Two Pillows - 10 reps - 2 sets - 2x daily - 7x weekly  Prone Press Up on Elbows - 10 reps - 1 sets - 2x daily - 7x weekly  Standing Lumbar Extension - 10 reps - 1 sets - 3x daily - 7x weekly  Left Standing Lateral Shift Correction at [place] - 10 reps - 1 sets - 1x daily - 7x weekly  Bird Dog - 10 reps - 1 sets - 1x daily - 7x weekly  Cat-Camel to Child's Pose - 10 reps - 1 sets - 1x daily - 7x weekly  Supine Transversus Abdominis Bracing - Hands on Stomach - 10 reps - 1 sets - 1x daily - 7x weekly  Supine Transversus Abdominis Bracing - Hands on Thighs - 10 reps - 1 sets - 1x daily - 7x weekly  Supine 90/90 Abdominal Bracing - 10 reps - 1 sets - 1x daily - 7x weekly      [facility] [address] [place], [place] [address] Phone # [phone] Fax [phone]

## 2018-05-21 ENCOUNTER — Ambulatory Visit: Payer: Federal, State, Local not specified - PPO

## 2018-05-21 DIAGNOSIS — M5441 Lumbago with sciatica, right side: Secondary | ICD-10-CM | POA: Diagnosis not present

## 2018-05-21 DIAGNOSIS — M6281 Muscle weakness (generalized): Secondary | ICD-10-CM

## 2018-05-21 NOTE — Therapy (Signed)
[facility] [address] [place], [place], [address] Phone: [phone]   Fax:  [phone]  Physical Therapy Treatment  Patient Details  Name: Pamela Novak MRN: [id number] Date of Birth: 1971/10/01 Referring Provider: Dr. Scarlette Calico   Encounter Date: 05/21/2018  PT End of Session - 05/21/18 1653    Visit Number  7    Date for PT Re-Evaluation  06/21/18    Authorization Type  BCBS 50 visit limit    Authorization - Visit Number  7    Authorization - Number of Visits  50    PT Start Time  2297    PT Stop Time  1658    PT Time Calculation (min)  48 min    Activity Tolerance  Patient tolerated treatment well    Behavior During Therapy  [facility] for tasks assessed/performed       Past Medical History:  Diagnosis Date  . Allergy   . Anxiety   . Blood in stool   . Breast hematoma   . Complication of anesthesia    "woke up during colonoscopy"  . Depression   . GERD (gastroesophageal reflux disease)   . Hyperlipidemia   . Traumatic hematoma of knee     Past Surgical History:  Procedure Laterality Date  . ABLATION ON ENDOMETRIOSIS  10/04/2012   Procedure: ABLATION ON ENDOMETRIOSIS;  Surgeon: Linda Hedges, DO;  Location: [facility];  Service: Gynecology;  Laterality: N/A;  . COLONOSCOPY    . CONDYLOMA EXCISION/FULGURATION  10/04/2012   Procedure: CONDYLOMA REMOVAL;  Surgeon: Linda Hedges, DO;  Location: [facility];  Service: Gynecology;  Laterality: N/A;  . DILITATION & CURRETTAGE/HYSTROSCOPY WITH NOVASURE ABLATION  10/04/2012   Procedure: DILATATION & CURETTAGE/HYSTEROSCOPY WITH NOVASURE ABLATION;  Surgeon: Linda Hedges, DO;  Location: [facility];  Service: Gynecology;  Laterality: N/A;  . LAPAROSCOPY  10/04/2012   Procedure: LAPAROSCOPY OPERATIVE;  Surgeon: Linda Hedges, DO;  Location: [facility];  Service: Gynecology;  Laterality: N/A;    There were no vitals filed for this visit.  Subjective Assessment - 05/21/18 1608    Subjective  I am getting better.  40% overall improvement.      Patient Stated Goals  pain to be completely gone     Currently in Pain?  Yes    Pain Score  3     Pain Location  Hip    Pain Orientation  Right    Pain Descriptors / Indicators  Sore    Pain Type  Acute pain    Pain Onset  More than a month ago    Pain Frequency  Constant    Aggravating Factors   sitting, standing    Pain Relieving Factors  heat, TENs unit                       [facility] Adult PT Treatment/Exercise - 05/21/18 0001      Lumbar Exercises: Quadruped   Madcat/Old Horse  10 reps    Single Arm Raise  Right;Left;5 reps    Straight Leg Raise  5 reps    Opposite Arm/Leg Raise  Right arm/Left leg;Left arm/Right leg;5 reps      Moist Heat Therapy   Number Minutes Moist Heat  10 Minutes    Moist Heat Location  Hip      Manual Therapy   Manual Therapy  Soft tissue mobilization;Myofascial release    Manual therapy comments  bil lumbar paraspinals and Rt  gluteals with trigger point release at Rt gluteals       Trigger Point Dry Needling - 05/21/18 1614    Consent Given?  Yes    Muscles Treated Lower Body  Gluteus minimus;Gluteus maximus lumbar multifidi bil, Rt gluteals    Gluteus Maximus Response  Twitch response elicited;Palpable increased muscle length    Gluteus Minimus Response  Twitch response elicited;Palpable increased muscle length    Piriformis Response  Twitch response elicited;Palpable increased muscle length             PT Short Term Goals - 05/21/18 1617      PT SHORT TERM GOAL #1   Title  The patient will demonstrate knowledge of initial HEP, centralization, basic body mechanics training and postural correction     Status  Achieved      PT SHORT TERM GOAL #3   Title  The patient will have centralization of symptoms 50% of the time    Baseline  40%    Time  4    Period  Weeks    Status  On-going      PT SHORT TERM GOAL #4   Title  The patient will demonstrate  improved mobility, strength  and body mechanics to lift light to medium objects from the floor     Baseline  pt has been avoiding this    Time  4    Period  Weeks    Status  On-going        PT Long Term Goals - 04/26/18 2220      PT LONG TERM GOAL #1   Title  The patient will be independent in safe, self progression of HEP     Time  8    Period  Weeks    Status  New    Target Date  06/21/18      PT LONG TERM GOAL #2   Title  The patient will report an overall 70% improvement in pain with usual ADLs    Time  8    Period  Weeks    Status  New      PT LONG TERM GOAL #3   Title  The patient will have improved lumbar ROM: flexion 45 degrees, extension to 20 degrees needed for mobility with ADLs    Time  8    Period  Weeks    Status  New      PT LONG TERM GOAL #4   Title  The patient will have improved right LE strength and core strength grossly 4+/5 needed for lifting medium objects and standing/walking for longer periods of time    Time  8    Period  Weeks    Status  New      PT LONG TERM GOAL #5   Title  FOTO functional outcome score improved from 59% limitation to 40% indicating improved function with less pain    Time  8    Period  Weeks    Status  New            Plan - 05/21/18 1618    Clinical Impression Statement  Pt reports 40% overall improvement/centralization of Rt LE symptoms.  Pt is doing well with progression of hip mobility and hip hinging exercises that were added last session.  Pt with tension and trigger points in bil multifidi and Rt gluteals and demonstrated improved tissue mobility after dry needling today.  Pt with good stability with quadruped exercise today.  Pt  will continue to benefit from skilled PT for core strength, manual and flexibility.    (Pended)     Rehab Potential  Good  (Pended)     Clinical Impairments Affecting Rehab Potential  anxiety   (Pended)     PT Duration  8 weeks  (Pended)     PT Treatment/Interventions  ADLs/Self Care  Home Management;Cryotherapy;Electrical Stimulation;Therapeutic activities;Therapeutic exercise;Ultrasound;Traction;Patient/family education;Manual techniques;Dry needling;Neuromuscular re-education;Taping;Moist Heat  (Pended)     PT Next Visit Plan   assess response to dry needling to lumbar multifidi and Rt gluteals #3 and continue if beneficial;  add neural flossing;   core strength  (Pended)     PT Home Exercise Plan   Access Code: [email or web address]   (Pended)     Consulted and Agree with Plan of Care  Patient  (Pended)        Patient will benefit from skilled therapeutic intervention in order to improve the following deficits and impairments:  (P) Pain, Decreased range of motion, Decreased activity tolerance, Decreased strength, Impaired perceived functional ability  Visit Diagnosis: Acute right-sided low back pain with right-sided sciatica  Muscle weakness (generalized)     Problem List Patient Active Problem List   Diagnosis Date Noted  . Migraine without aura and with status migrainosus, not intractable 04/12/2018  . Bilateral low back pain 03/21/2018  . Lumbar disc herniation 03/21/2018  . Prediabetes 03/23/2017  . Seborrheic dermatitis of scalp 03/23/2017  . Hyperlipidemia 01/21/2016  . Depression 01/18/2016  . PTSD (post-traumatic stress disorder) 01/18/2016  . GERD (gastroesophageal reflux disease) 01/18/2016  . Varicose veins 05/21/2013  . GAD (generalized anxiety disorder) 11/02/2012     Sigurd Sos, PT 05/21/18 4:57 PM  [facility] [address] [place], [place], [address] Phone: [phone]   Fax:  [phone]  Name: Pamela Novak MRN: [id number] Date of Birth: [birth year]

## 2018-05-24 ENCOUNTER — Encounter: Payer: Self-pay | Admitting: Physical Therapy

## 2018-05-24 ENCOUNTER — Ambulatory Visit: Payer: Federal, State, Local not specified - PPO | Admitting: Physical Therapy

## 2018-05-24 DIAGNOSIS — M5441 Lumbago with sciatica, right side: Secondary | ICD-10-CM

## 2018-05-24 DIAGNOSIS — M6281 Muscle weakness (generalized): Secondary | ICD-10-CM | POA: Diagnosis not present

## 2018-05-24 NOTE — Therapy (Signed)
[facility] [address] [place], [place], [address] Phone: [phone]   Fax:  [phone]  Physical Therapy Treatment  Patient Details  Name: Pamela Novak MRN: [id number] Date of Birth: 1971/02/05 Referring Provider: Dr. Scarlette Calico   Encounter Date: 05/24/2018  PT End of Session - 05/24/18 1605    Visit Number  8    Date for PT Re-Evaluation  06/21/18    Authorization Type  BCBS 50 visit limit    Authorization - Number of Visits  53    PT Start Time  3846    PT Stop Time  1614    PT Time Calculation (min)  41 min       Past Medical History:  Diagnosis Date  . Allergy   . Anxiety   . Blood in stool   . Breast hematoma   . Complication of anesthesia    "woke up during colonoscopy"  . Depression   . GERD (gastroesophageal reflux disease)   . Hyperlipidemia   . Traumatic hematoma of knee     Past Surgical History:  Procedure Laterality Date  . ABLATION ON ENDOMETRIOSIS  10/04/2012   Procedure: ABLATION ON ENDOMETRIOSIS;  Surgeon: Linda Hedges, DO;  Location: [facility];  Service: Gynecology;  Laterality: N/A;  . COLONOSCOPY    . CONDYLOMA EXCISION/FULGURATION  10/04/2012   Procedure: CONDYLOMA REMOVAL;  Surgeon: Linda Hedges, DO;  Location: [facility];  Service: Gynecology;  Laterality: N/A;  . DILITATION & CURRETTAGE/HYSTROSCOPY WITH NOVASURE ABLATION  10/04/2012   Procedure: DILATATION & CURETTAGE/HYSTEROSCOPY WITH NOVASURE ABLATION;  Surgeon: Linda Hedges, DO;  Location: [facility];  Service: Gynecology;  Laterality: N/A;  . LAPAROSCOPY  10/04/2012   Procedure: LAPAROSCOPY OPERATIVE;  Surgeon: Linda Hedges, DO;  Location: [facility];  Service: Gynecology;  Laterality: N/A;    There were no vitals filed for this visit.  Subjective Assessment - 05/24/18 1627    Subjective    So sore!.  I did walk yesterday and I can feel it in buttock and back of the thigh and foot pain.  I'm frustrated b/c it got better but now I  feel like my leg symptoms are getting worse.      Currently in Pain?  Yes    Pain Score  5     Pain Location  Hip    Pain Orientation  Right    Pain Type  Acute pain    Pain Onset  More than a month ago                       [facility] Adult PT Treatment/Exercise - 05/24/18 0001      Lumbar Exercises: Standing   Other Standing Lumbar Exercises  standing lumbar extension x 10      Lumbar Exercises: Sidelying   Other Sidelying Lumbar Exercises  lateral compartment over folded pillow on right reports worsening of right buttock and post thigh    Other Sidelying Lumbar Exercises  lateral compartment over pillow on left side  initially reports decreased symptoms but upon rising calf p      Lumbar Exercises: Prone   Other Prone Lumbar Exercises  press ups 5x makes my back "scream"      Other Prone Lumbar Exercises  press up with manal overpressure 10x;  press ups with belt fixation 10x reports decreased LE symptoms and more tolerable centrally      Manual Therapy   Joint Mobilization  PA mobs  grade 3 L5-S1 10x pressure with exhalation             PT Education - 05/24/18 1656    Education Details  adding overpressure to press ups; flexion rotation in left sidelying    Person(s) Educated  Patient    Methods  Explanation;Demonstration;Handout    Comprehension  Returned demonstration;Verbalized understanding       PT Short Term Goals - 05/21/18 1617      PT SHORT TERM GOAL #1   Title  The patient will demonstrate knowledge of initial HEP, centralization, basic body mechanics training and postural correction     Status  Achieved      PT SHORT TERM GOAL #3   Title  The patient will have centralization of symptoms 50% of the time    Baseline  40%    Time  4    Period  Weeks    Status  On-going      PT SHORT TERM GOAL #4   Title  The patient will demonstrate improved mobility, strength  and body mechanics to lift light to medium objects from the floor     Baseline   pt has been avoiding this    Time  4    Period  Weeks    Status  On-going        PT Long Term Goals - 04/26/18 2220      PT LONG TERM GOAL #1   Title  The patient will be independent in safe, self progression of HEP     Time  8    Period  Weeks    Status  New    Target Date  06/21/18      PT LONG TERM GOAL #2   Title  The patient will report an overall 70% improvement in pain with usual ADLs    Time  8    Period  Weeks    Status  New      PT LONG TERM GOAL #3   Title  The patient will have improved lumbar ROM: flexion 45 degrees, extension to 20 degrees needed for mobility with ADLs    Time  8    Period  Weeks    Status  New      PT LONG TERM GOAL #4   Title  The patient will have improved right LE strength and core strength grossly 4+/5 needed for lifting medium objects and standing/walking for longer periods of time    Time  8    Period  Weeks    Status  New      PT LONG TERM GOAL #5   Title  FOTO functional outcome score improved from 59% limitation to 40% indicating improved function with less pain    Time  8    Period  Weeks    Status  New            Plan - 05/24/18 1656    Clinical Impression Statement  The was responding well to treatment with decreased peripheral symptoms however she reports an increase in foot, knee and thigh pain this week.  She had discontinued press ups secondary to extreme discomfort in her central low back.  Instructed in a trial of press ups lumbar manual and belt fixation which made her lumbar symptoms more tolerable and decreased distal symptoms as well.  She initially felt relief with lateral compartment "opening" in sidelying but then reported LE symptoms upon rising.  She will focus on extension  with overpressure over the weekend.  If no better, will consider trying mechanical traction again.   No STGs met secondary to flare up.      Rehab Potential  Good    Clinical Impairments Affecting Rehab Potential  anxiety     PT  Frequency  2x / week    PT Duration  8 weeks    PT Treatment/Interventions  ADLs/Self Care Home Management;Cryotherapy;Electrical Stimulation;Therapeutic activities;Therapeutic exercise;Ultrasound;Traction;Patient/family education;Manual techniques;Dry needling;Neuromuscular re-education;Taping;Moist Heat    PT Next Visit Plan  assess response to extension with overpressure;  may re-try lumbar mechanical traction;  modalities as needed;  DN as needed;  assess STGs    PT Home Exercise Plan   Access Code: [email or web address]        Patient will benefit from skilled therapeutic intervention in order to improve the following deficits and impairments:  Pain, Decreased range of motion, Decreased activity tolerance, Decreased strength, Impaired perceived functional ability  Visit Diagnosis: Acute right-sided low back pain with right-sided sciatica  Muscle weakness (generalized)     Problem List Patient Active Problem List   Diagnosis Date Noted  . Migraine without aura and with status migrainosus, not intractable 04/12/2018  . Bilateral low back pain 03/21/2018  . Lumbar disc herniation 03/21/2018  . Prediabetes 03/23/2017  . Seborrheic dermatitis of scalp 03/23/2017  . Hyperlipidemia 01/21/2016  . Depression 01/18/2016  . PTSD (post-traumatic stress disorder) 01/18/2016  . GERD (gastroesophageal reflux disease) 01/18/2016  . Varicose veins 05/21/2013  . GAD (generalized anxiety disorder) 11/02/2012   Ruben Im, PT 05/24/18 5:06 PM Phone: [phone] Fax: [phone]  Alvera Singh 05/24/2018, 5:06 PM  [facility] [address] [place], [place], [address] Phone: [phone]   Fax:  [phone]  Name: Pamela Novak MRN: [id number] Date of Birth: 10/28/1971

## 2018-05-24 NOTE — Patient Instructions (Signed)
     Add overpressure with belt, hand pressure, sheet or towel as often as possible.    Try left sidelying over folded pillow.  Add more a pretzel stretch by straightening bottom leg and letting right leg move forward.  Bring left shoulder forward 3-5 minutes.       Ruben Im PT [facility] [address], [place], [place] [address] Phone # [phone] Fax [phone]

## 2018-05-28 ENCOUNTER — Telehealth: Payer: Self-pay | Admitting: Internal Medicine

## 2018-05-28 ENCOUNTER — Ambulatory Visit: Payer: Federal, State, Local not specified - PPO

## 2018-05-28 DIAGNOSIS — M5441 Lumbago with sciatica, right side: Secondary | ICD-10-CM | POA: Diagnosis not present

## 2018-05-28 DIAGNOSIS — M6281 Muscle weakness (generalized): Secondary | ICD-10-CM | POA: Diagnosis not present

## 2018-05-28 DIAGNOSIS — M5416 Radiculopathy, lumbar region: Secondary | ICD-10-CM

## 2018-05-28 DIAGNOSIS — M5126 Other intervertebral disc displacement, lumbar region: Secondary | ICD-10-CM

## 2018-05-28 DIAGNOSIS — M545 Low back pain: Secondary | ICD-10-CM

## 2018-05-28 NOTE — Therapy (Addendum)
[facility] [address] [place], [place], [address] Phone: [phone]   Fax:  [phone]  Physical Therapy Treatment  Patient Details  Name: Pamela Novak MRN: [id number] Date of Birth: 1971-04-08 Referring Provider: Dr. Scarlette Calico   Encounter Date: 05/28/2018  PT End of Session - 05/28/18 1445    Visit Number  9    Date for PT Re-Evaluation  06/21/18    Authorization Type  [employer] 50 visit limit    Authorization - Visit Number  8    Authorization - Number of Visits  35    PT Start Time  1062 discussed progress with pt and the e-stim only    PT Stop Time  1435    PT Time Calculation (min)  30 min    Activity Tolerance  Patient tolerated treatment well    Behavior During Therapy  [facility] for tasks assessed/performed       Past Medical History:  Diagnosis Date  . Allergy   . Anxiety   . Blood in stool   . Breast hematoma   . Complication of anesthesia    "woke up during colonoscopy"  . Depression   . GERD (gastroesophageal reflux disease)   . Hyperlipidemia   . Traumatic hematoma of knee     Past Surgical History:  Procedure Laterality Date  . ABLATION ON ENDOMETRIOSIS  10/04/2012   Procedure: ABLATION ON ENDOMETRIOSIS;  Surgeon: Linda Hedges, DO;  Location: [facility];  Service: Gynecology;  Laterality: N/A;  . COLONOSCOPY    . CONDYLOMA EXCISION/FULGURATION  10/04/2012   Procedure: CONDYLOMA REMOVAL;  Surgeon: Linda Hedges, DO;  Location: [facility];  Service: Gynecology;  Laterality: N/A;  . DILITATION & CURRETTAGE/HYSTROSCOPY WITH NOVASURE ABLATION  10/04/2012   Procedure: DILATATION & CURETTAGE/HYSTEROSCOPY WITH NOVASURE ABLATION;  Surgeon: Linda Hedges, DO;  Location: [facility];  Service: Gynecology;  Laterality: N/A;  . LAPAROSCOPY  10/04/2012   Procedure: LAPAROSCOPY OPERATIVE;  Surgeon: Linda Hedges, DO;  Location: [facility];  Service: Gynecology;  Laterality: N/A;    There were no vitals filed for this  visit.  Subjective Assessment - 05/28/18 1404    Subjective  I am having a bad day today.  Still with pain down the the Rt LE.  I wasn't able to do the press ups due to increased pain.      Currently in Pain?  Yes    Pain Score  4     Pain Location  Hip    Pain Orientation  Right    Pain Descriptors / Indicators  Sore    Pain Type  Acute pain    Pain Radiating Towards  Rt LE     Pain Onset  More than a month ago    Pain Frequency  Constant    Aggravating Factors   sitting too long    Pain Relieving Factors  heat, TENs unit                       [facility] Adult PT Treatment/Exercise - 05/28/18 0001      Modalities   Modalities  Electrical Stimulation      Moist Heat Therapy   Number Minutes Moist Heat  15 Minutes    Moist Heat Location  Lumbar Spine      Electrical Stimulation   Electrical Stimulation Location  bil lumbar and Rt gluteals/hamstring    Electrical Stimulation Action  IFC    Electrical Stimulation Parameters  15 minutes    Electrical Stimulation Goals  Pain               PT Short Term Goals - 05/21/18 1617      PT SHORT TERM GOAL #1   Title  The patient will demonstrate knowledge of initial HEP, centralization, basic body mechanics training and postural correction     Status  Achieved      PT SHORT TERM GOAL #3   Title  The patient will have centralization of symptoms 50% of the time    Baseline  40%    Time  4    Period  Weeks    Status  On-going      PT SHORT TERM GOAL #4   Title  The patient will demonstrate improved mobility, strength  and body mechanics to lift light to medium objects from the floor     Baseline  pt has been avoiding this    Time  4    Period  Weeks    Status  On-going        PT Long Term Goals - 04/26/18 2220      PT LONG TERM GOAL #1   Title  The patient will be independent in safe, self progression of HEP     Time  8    Period  Weeks    Status  New    Target Date  06/21/18      PT LONG TERM GOAL  #2   Title  The patient will report an overall 70% improvement in pain with usual ADLs    Time  8    Period  Weeks    Status  New      PT LONG TERM GOAL #3   Title  The patient will have improved lumbar ROM: flexion 45 degrees, extension to 20 degrees needed for mobility with ADLs    Time  8    Period  Weeks    Status  New      PT LONG TERM GOAL #4   Title  The patient will have improved right LE strength and core strength grossly 4+/5 needed for lifting medium objects and standing/walking for longer periods of time    Time  8    Period  Weeks    Status  New      PT LONG TERM GOAL #5   Title  FOTO functional outcome score improved from 59% limitation to 40% indicating improved function with less pain    Time  8    Period  Weeks    Status  New            Plan - 05/28/18 1428    Clinical Impression Statement  Pt with flare-up of pain over the past week.  Pt is not able to perform exercises due to increased Rt LE pain.  Pt is not able to perform lumbar extension due to incresaed LBP.  Pt will contact the MD due to continued symptoms that are unchanged.  PT will place pt on hold until she sees MD.      Rehab Potential  Good    PT Frequency  2x / week    PT Duration  8 weeks    PT Treatment/Interventions  ADLs/Self Care Home Management;Cryotherapy;Electrical Stimulation;Therapeutic activities;Therapeutic exercise;Ultrasound;Traction;Patient/family education;Manual techniques;Dry needling;Neuromuscular re-education;Taping;Moist Heat    PT Next Visit Plan  Hold chart until pt sees MD.      PT Home Exercise Plan   Access Code:  [email or web address]     Consulted and Agree with Plan of Care  Patient       Patient will benefit from skilled therapeutic intervention in order to improve the following deficits and impairments:  Pain, Decreased range of motion, Decreased activity tolerance, Decreased strength, Impaired perceived functional ability  Visit Diagnosis: Acute right-sided low back pain  with right-sided sciatica     Problem List Patient Active Problem List   Diagnosis Date Noted  . Migraine without aura and with status migrainosus, not intractable 04/12/2018  . Bilateral low back pain 03/21/2018  . Lumbar disc herniation 03/21/2018  . Prediabetes 03/23/2017  . Seborrheic dermatitis of scalp 03/23/2017  . Hyperlipidemia 01/21/2016  . Depression 01/18/2016  . PTSD (post-traumatic stress disorder) 01/18/2016  . GERD (gastroesophageal reflux disease) 01/18/2016  . Varicose veins 05/21/2013  . GAD (generalized anxiety disorder) 11/02/2012     Sigurd Sos, PT 05/28/18 2:47 PM PHYSICAL THERAPY DISCHARGE SUMMARY  Visits from Start of Care: 9  Current functional level related to goals / functional outcomes: See above for current status.   Remaining deficits: See above for current PT status.     Education / Equipment: HEP, Economist Plan: Patient agrees to discharge.  Patient goals were partially met. Patient is being discharged due to not returning since the last visit.  ?????        Sigurd Sos, PT 07/11/18 2:41 PM   [facility] [address], [place], [place], [address] Phone: [phone]   Fax:  [phone]  Name: Pamela Novak MRN: [id number] Date of Birth: 1971-10-15

## 2018-05-28 NOTE — Telephone Encounter (Signed)
Patient states she has been to PT 9 times. She was making improvement & now she is not. She states PT told her they are going to the cancel her next few appointment and to let her PCP know, To see what the next step should be. If it was time for her to have a MRI or any other test should be performed.   She states the pain is awful and she needs some relief.  LOV 05/15/18 - Please advise what the next step should be.

## 2018-05-29 MED ORDER — GABAPENTIN 100 MG PO CAPS: 100.0000 mg | ORAL_CAPSULE | Freq: Three times a day (TID) | ORAL | 3 refills | Status: DC

## 2018-05-29 MED ORDER — CYCLOBENZAPRINE HCL 5 MG PO TABS: 5.0000 mg | ORAL_TABLET | Freq: Three times a day (TID) | ORAL | 0 refills | Status: DC | PRN

## 2018-05-29 NOTE — Telephone Encounter (Signed)
Spoke with pt to inform.  

## 2018-05-29 NOTE — Telephone Encounter (Signed)
Flexeril renewed.  If this makes her too drowsy she should let us know.  Continue gabapentin 300 mg at night.  Prescription sent for gabapentin 100 mg-take 1 pill in the morning.  Can increase to 2 pills if tolerated.

## 2018-05-29 NOTE — Telephone Encounter (Signed)
MRI ordered.  We can increase the gabapentin  -  That may help with her pain

## 2018-05-29 NOTE — Telephone Encounter (Signed)
Spoke with pt to inform. Pt would like to increase Gabapentin. Currently taking 300 mg at night. Would like to know if she could take a small dose in the morning and continue the 300 mg at night. She also was given Flexeril 5 mg at initial visit with Dr Ronnald Ramp and would like a refill or another muscle relaxer that is okay to take with Gabapentin.

## 2018-05-31 ENCOUNTER — Encounter: Payer: Self-pay | Admitting: Physical Therapy

## 2018-06-01 ENCOUNTER — Encounter: Payer: Self-pay | Admitting: Internal Medicine

## 2018-06-01 DIAGNOSIS — M5410 Radiculopathy, site unspecified: Secondary | ICD-10-CM

## 2018-06-04 ENCOUNTER — Ambulatory Visit: Payer: Federal, State, Local not specified - PPO

## 2018-06-05 MED ORDER — TRAMADOL HCL 50 MG PO TABS: 50.0000 mg | ORAL_TABLET | Freq: Three times a day (TID) | ORAL | 0 refills | Status: DC | PRN

## 2018-06-05 NOTE — Addendum Note (Signed)
Addended by: [name] on: 06/05/2018 07:49 PM   Modules accepted: Orders

## 2018-06-07 ENCOUNTER — Ambulatory Visit: Payer: Federal, State, Local not specified - PPO | Admitting: Physical Therapy

## 2018-06-14 ENCOUNTER — Encounter: Payer: Self-pay | Admitting: Internal Medicine

## 2018-06-14 ENCOUNTER — Encounter: Payer: Self-pay | Admitting: Physical Therapy

## 2018-06-14 ENCOUNTER — Ambulatory Visit
Admission: RE | Admit: 2018-06-14 | Discharge: 2018-06-14 | Disposition: A | Discharge status: Home / Self Care | Payer: Federal, State, Local not specified - PPO | Source: Ambulatory Visit | Attending: Internal Medicine | Admitting: Internal Medicine

## 2018-06-14 DIAGNOSIS — M5416 Radiculopathy, lumbar region: Secondary | ICD-10-CM

## 2018-06-14 DIAGNOSIS — G43001 Migraine without aura, not intractable, with status migrainosus: Secondary | ICD-10-CM

## 2018-06-14 MED ORDER — ZOLMITRIPTAN 5 MG NA SOLN: 1.0000 | NASAL | 3 refills | Status: DC | PRN

## 2018-06-19 ENCOUNTER — Ambulatory Visit: Payer: Self-pay | Admitting: Physical Therapy

## 2018-06-20 ENCOUNTER — Other Ambulatory Visit: Payer: Self-pay

## 2018-06-22 ENCOUNTER — Encounter: Payer: Self-pay | Admitting: Physical Therapy

## 2018-06-26 ENCOUNTER — Ambulatory Visit: Payer: Federal, State, Local not specified - PPO | Admitting: Gastroenterology

## 2018-06-26 ENCOUNTER — Encounter: Payer: Self-pay | Admitting: Gastroenterology

## 2018-06-26 VITALS — BP 112/74 | HR 96 | Ht 67.0 in | Wt 177.5 lb

## 2018-06-26 DIAGNOSIS — K641 Second degree hemorrhoids: Secondary | ICD-10-CM | POA: Diagnosis not present

## 2018-06-26 DIAGNOSIS — Z83719 Family history of colon polyps, unspecified: Secondary | ICD-10-CM

## 2018-06-26 DIAGNOSIS — K5900 Constipation, unspecified: Secondary | ICD-10-CM | POA: Diagnosis not present

## 2018-06-26 DIAGNOSIS — K625 Hemorrhage of anus and rectum: Secondary | ICD-10-CM | POA: Diagnosis not present

## 2018-06-26 DIAGNOSIS — K219 Gastro-esophageal reflux disease without esophagitis: Secondary | ICD-10-CM

## 2018-06-26 DIAGNOSIS — Z800 Family history of malignant neoplasm of digestive organs: Secondary | ICD-10-CM

## 2018-06-26 DIAGNOSIS — Z8371 Family history of colonic polyps: Secondary | ICD-10-CM

## 2018-06-26 MED ORDER — NA SULFATE-K SULFATE-MG SULF 17.5-3.13-1.6 GM/177ML PO SOLN: ORAL | 0 refills | Status: DC

## 2018-06-26 MED ORDER — LUBIPROSTONE 8 MCG PO CAPS: 8.0000 ug | ORAL_CAPSULE | Freq: Two times a day (BID) | ORAL | 1 refills | Status: DC

## 2018-06-26 MED ORDER — RANITIDINE HCL 150 MG PO TABS: 150.0000 mg | ORAL_TABLET | Freq: Every day | ORAL | 3 refills | Status: DC

## 2018-06-26 MED ORDER — HYDROCORTISONE 2.5 % RE CREA: 1.0000 "application " | TOPICAL_CREAM | Freq: Two times a day (BID) | RECTAL | 1 refills | Status: DC

## 2018-06-26 MED ORDER — PANTOPRAZOLE SODIUM 40 MG PO TBEC: 40.0000 mg | DELAYED_RELEASE_TABLET | Freq: Every day | ORAL | 3 refills | Status: DC

## 2018-06-26 NOTE — Patient Instructions (Addendum)
Take benefiber 1 teaspoon three times a day  Use Anusol cream as needed per rectum  We will send Amitiza 8 mcg to your pharmacy  We also sent the Suprep for the colonoscopy to [facility] in [place], [address]   If you are age 49 or older, your body mass index should be between 23-30. Your Body mass index is 27.8 kg/m. If this is out of the aforementioned range listed, please consider follow up with your Primary Care Provider.  If you are age 31 or younger, your body mass index should be between 19-25. Your Body mass index is 27.8 kg/m. If this is out of the aformentioned range listed, please consider follow up with your Primary Care Provider.   You have been scheduled for a colonoscopy. Please follow written instructions given to you at your visit today.  Please pick up your prep supplies at the pharmacy within the next 1-3 days. If you use inhalers (even only as needed), please bring them with you on the day of your procedure.    Thank you for choosing [facility]  Karleen Hampshire [place],MD

## 2018-06-26 NOTE — Progress Notes (Signed)
Pamela Novak    [id number]    11/27/1970  Primary Care Physician:Burns, Claudina Lick, MD  Referring Physician: [name], MD [place],  [id number]  Chief complaint:  GERD, BRBPR, constipation, hemorrhoids  HPI: 47 year old female here for follow-up visit She has breakthrough heartburn, globus sensation and sore throat if she skips a dose of Protonix or Zantac at bedtime.  Denies any dysphagia or odynophagia.  No nausea, vomiting, abdominal pain or black stool. She recently started duloxetine and has been having worsening constipation in the past week or so.  Also has been having worsening intermittent bright red blood per rectum and hemorrhoidal symptoms with protrusion and swelling of hemorrhoids. Family history positive for colon cancer in her grandfather.  Her father and sister has history of multiple advanced colon polyps. Colonoscopy in October 18 , 2013 by Dr. Watt Climes at [facility] showed internal and external hemorrhoids, sigmoid diverticulosis.  Outpatient Encounter Medications as of 06/26/2018  Medication Sig  . atorvastatin (LIPITOR) 20 MG tablet Take 1 tablet (20 mg total) by mouth daily.  [name] Kitchen buPROPion (WELLBUTRIN XL) 150 MG 24 hr tablet Take 150 mg by mouth daily.  . cyclobenzaprine (FLEXERIL) 5 MG tablet Take 1 tablet (5 mg total) by mouth 3 (three) times daily as needed for muscle spasms.  . DULoxetine (CYMBALTA) 30 MG capsule Take 30-60 mg by mouth daily.  [name] Kitchen gabapentin (NEURONTIN) 100 MG capsule Take 1 capsule (100 mg total) by mouth at bedtime. Increase up to 300 mg at night if needed  . ketoconazole (NIZORAL) 2 % shampoo Apply 1 application topically 2 (two) times a week.  . pantoprazole (PROTONIX) 40 MG tablet Take 1 tablet (40 mg total) by mouth daily.  . ranitidine (ZANTAC) 150 MG tablet Take 1 tablet (150 mg total) by mouth at bedtime. -- Office visit needed for further  . sertraline (ZOLOFT) 50 MG tablet Take 75 mg daily by mouth. Take 1.5  tablets twice daily.   . traMADol (ULTRAM) 50 MG tablet Take 1 tablet (50 mg total) by mouth every 8 (eight) hours as needed.  . zolmitriptan (ZOMIG) 5 MG nasal solution Place 1 spray into the nose as needed for migraine.  [name] Kitchen EPINEPHrine (AUVI-Q) 0.3 mg/0.3 mL IJ SOAJ injection Inject 0.3 mg into the muscle as needed.  . hydrocortisone (ANUSOL-HC) 2.5 % rectal cream Place 1 application rectally 2 (two) times daily.  [name] Kitchen lubiprostone (AMITIZA) 8 MCG capsule Take 1 capsule (8 mcg total) by mouth 2 (two) times daily with a meal.  . [DISCONTINUED] buPROPion (WELLBUTRIN XL) 300 MG 24 hr tablet Take 300 mg by mouth daily.  . [DISCONTINUED] CARAFATE 1 GM/10ML suspension 10MLS BY MOUTH 4 TIMES A DAY WITH MEALS AND AT BEDTIME (Patient taking differently: 10MLS BY MOUTH once  a week  WITH MEALS in the evening)  . [DISCONTINUED] gabapentin (NEURONTIN) 100 MG capsule Take 1 capsule (100 mg total) by mouth 3 (three) times daily.  . [DISCONTINUED] lidocaine (XYLOCAINE) 2 % solution 10 ml 3 times daily as needed for throat pain. Swish and swallow.  . [DISCONTINUED] linaclotide (LINZESS) 72 MCG capsule Take 1 capsule (72 mcg total) by mouth daily before breakfast.   No facility-administered encounter medications on file as of 06/26/2018.     Allergies as of 06/26/2018 - Review Complete 06/26/2018  Allergen Reaction Noted  . Capsaicin Cough 01/18/2016  . Flagyl [metronidazole] Rash 09/11/2012    Past Medical History:  Diagnosis Date  . Allergy   . Anxiety   . Blood in stool   . Breast hematoma   . Complication of anesthesia    "woke up during colonoscopy"  . Depression   . GERD (gastroesophageal reflux disease)   . Hyperlipidemia   . Traumatic hematoma of knee     Past Surgical History:  Procedure Laterality Date  . ABLATION ON ENDOMETRIOSIS  10/04/2012   Procedure: ABLATION ON ENDOMETRIOSIS;  Surgeon: Linda Hedges, DO;  Location: [facility];  Service: Gynecology;  Laterality: N/A;  . COLONOSCOPY    .  CONDYLOMA EXCISION/FULGURATION  10/04/2012   Procedure: CONDYLOMA REMOVAL;  Surgeon: Linda Hedges, DO;  Location: [facility];  Service: Gynecology;  Laterality: N/A;  . DILITATION & CURRETTAGE/HYSTROSCOPY WITH NOVASURE ABLATION  10/04/2012   Procedure: DILATATION & CURETTAGE/HYSTEROSCOPY WITH NOVASURE ABLATION;  Surgeon: Linda Hedges, DO;  Location: [facility];  Service: Gynecology;  Laterality: N/A;  . LAPAROSCOPY  10/04/2012   Procedure: LAPAROSCOPY OPERATIVE;  Surgeon: Linda Hedges, DO;  Location: [facility];  Service: Gynecology;  Laterality: N/A;    Family History  Problem Relation Age of Onset  . Heart disease Mother   . Arthritis Mother   . Hyperlipidemia Mother   . Hypertension Mother   . Heart attack Mother   . Cancer Father        Colon Cancer-Polyps  . Hypertension Father   . Hashimoto's thyroiditis Sister   . Diabetes Sister   . Thyroid disease Sister   . Cancer Paternal Grandfather        Colon Cancer  . Hashimoto's thyroiditis Sister   . Thyroid disease Sister     Social History   Socioeconomic History  . Marital status: Married    Spouse name: Not on file  . Number of children: 2  . Years of education: 12+  . Highest education level: Not on file  Occupational History  . Occupation: adm Radiation protection practitioner: [employer]  . Financial resource strain: Not on file  . Food insecurity:    Worry: Not on file    Inability: Not on file  . Transportation needs:    Medical: Not on file    Non-medical: Not on file  Tobacco Use  . Smoking status: Never Smoker  . Smokeless tobacco: Never Used  Substance and Sexual Activity  . Alcohol use: Yes    Comment: rare  . Drug use: No  . Sexual activity: Yes  Lifestyle  . Physical activity:    Days per week: Not on file    Minutes per session: Not on file  . Stress: Not on file  Relationships  . Social connections:    Talks on phone: Not on file    Gets together: Not on file    Attends religious service:  Not on file    Active member of club or organization: Not on file    Attends meetings of clubs or organizations: Not on file    Relationship status: Not on file  . Intimate partner violence:    Fear of current or ex partner: Not on file    Emotionally abused: Not on file    Physically abused: Not on file    Forced sexual activity: Not on file  Other Topics Concern  . Not on file  Social History Narrative   Regular exercise-no   Caffeine Use-yes      Review of systems: Review of Systems  Constitutional: Negative for  fever and chills. Positive for lack of energy HENT: Negative.   Eyes: Negative for blurred vision.  Respiratory: Negative for cough, shortness of breath and wheezing.   Cardiovascular: Negative for chest pain and palpitations.  Gastrointestinal: as per HPI Genitourinary: Negative for dysuria, urgency, frequency and hematuria.  Musculoskeletal: Positive for myalgias, back pain and joint pain.  Skin: Negative for itching and rash.  Neurological: Negative for dizziness, tremors, focal weakness, seizures and loss of consciousness.  Endo/Heme/Allergies: Positive for seasonal allergies.  Psychiatric/Behavioral: Negative for suicidal ideas and hallucinations. Positive for depression and anxiety All other systems reviewed and are negative.   Physical Exam: Vitals:   06/26/18 1011  BP: 112/74  Pulse: 96   Body mass index is 27.8 kg/m. Gen:      No acute distress HEENT:  EOMI, sclera anicteric Neck:     No masses; no thyromegaly Lungs:    Clear to auscultation bilaterally; normal respiratory effort CV:         Regular rate and rhythm; no murmurs Abd:      + bowel sounds; soft, non-tender; no palpable masses, no distension Ext:    No edema; adequate peripheral perfusion Skin:      Warm and dry; no rash Neuro: alert and oriented x 3 Psych: normal mood and affect Rectal exam: Normal anal sphincter tone, no anal fissure or external hemorrhoids Anoscopy:Large  internal hemorrhoids, no active bleeding, normal dentate line, no visible nodules  Data Reviewed:  Reviewed labs, radiology imaging, old records and pertinent past GI work up   Assessment and Plan/Recommendations:  47 year old female with history of GERD here for follow-up visit with complaints of worsening hemorrhoidal symptoms and intermittent bright red blood per rectum GERD: Continue Protonix 40 mg daily in the morning and Zantac at bedtime Discussed antireflux measures and lifestyle modifications in detail  Constipation exacerbated by recent changes in medications Increase dietary fiber and fluid intake Start low-dose Amitiza 8 mcg twice daily  Symptomatic hemorrhoids Anusol cream per rectum twice daily as needed Advised patient to avoid excessive straining  Small-volume intermittent rectal bleeding likely secondary to hemorrhoid.  She has family history of colon cancer and also advanced colon polyps in her sister and father Last colonoscopy over 5 years ago.  Will proceed with colonoscopy to further evaluate the rectal bleeding and exclude any neoplastic lesion The risks and benefits as well as alternatives of endoscopic procedure(s) have been discussed and reviewed. All questions answered. The patient agrees to proceed.     Damaris Hippo , MD [phone]    CC: [name], MD

## 2018-06-29 ENCOUNTER — Encounter: Payer: Self-pay | Admitting: Gastroenterology

## 2018-07-04 ENCOUNTER — Encounter: Payer: Self-pay | Admitting: Gastroenterology

## 2018-07-04 ENCOUNTER — Encounter: Payer: Self-pay | Admitting: Internal Medicine

## 2018-07-04 ENCOUNTER — Ambulatory Visit (AMBULATORY_SURGERY_CENTER): Payer: Federal, State, Local not specified - PPO | Admitting: Gastroenterology

## 2018-07-04 VITALS — BP 114/69 | HR 86 | Temp 98.4°F | Resp 18 | Ht 67.0 in | Wt 177.0 lb

## 2018-07-04 DIAGNOSIS — K625 Hemorrhage of anus and rectum: Secondary | ICD-10-CM

## 2018-07-04 DIAGNOSIS — D123 Benign neoplasm of transverse colon: Secondary | ICD-10-CM

## 2018-07-04 DIAGNOSIS — D120 Benign neoplasm of cecum: Secondary | ICD-10-CM | POA: Diagnosis not present

## 2018-07-04 MED ORDER — SODIUM CHLORIDE 0.9 % IV SOLN: 500.0000 mL | Freq: Once | INTRAVENOUS | Status: DC

## 2018-07-04 NOTE — Op Note (Signed)
Pamela Novak: Pamela Novak Procedure Date: 07/04/2018 1:43 PM MRN: [id number] Endoscopist: Mauri Pole , MD Age: 47 Referring MD:  Date of Birth: Sep 23, 1971 Gender: Female Account #: [id number] Procedure:                Colonoscopy Indications:              Evaluation of unexplained GI bleeding, bright red                            blood per rectum, family history of advanced colon                            polyps Medicines:                Monitored Anesthesia Care Procedure:                Pre-Anesthesia Assessment:                           - Prior to the procedure, a History and Physical                            was performed, and patient medications and                            allergies were reviewed. The patient's tolerance of                            previous anesthesia was also reviewed. The risks                            and benefits of the procedure and the sedation                            options and risks were discussed with the patient.                            All questions were answered, and informed consent                            was obtained. Prior Anticoagulants: The patient has                            taken no previous anticoagulant or antiplatelet                            agents. ASA Grade Assessment: II - A patient with                            mild systemic disease. After reviewing the risks                            and benefits, the patient was deemed in  satisfactory condition to undergo the procedure.                           After obtaining informed consent, the colonoscope                            was passed under direct vision. Throughout the                            procedure, the patient's blood pressure, pulse, and                            oxygen saturations were monitored continuously. The                            Colonoscope was introduced through the anus and                             advanced to the the cecum, identified by                            appendiceal orifice and ileocecal valve. The                            colonoscopy was performed without difficulty. The                            patient tolerated the procedure well. The quality                            of the bowel preparation was good. The ileocecal                            valve, appendiceal orifice, and rectum were                            photographed. Scope In: 1:46:35 PM Scope Out: 2:06:25 PM Scope Withdrawal Time: 0 hours 15 minutes 38 seconds  Total Procedure Duration: 0 hours 19 minutes 50 seconds  Findings:                 The perianal and digital rectal examinations were                            normal.                           A 5 mm polyp was found in the cecum. The polyp was                            sessile. The polyp was removed with a cold snare.                            Resection and retrieval were complete.  A 2 mm polyp was found in the transverse colon. The                            polyp was sessile. The polyp was removed with a                            cold biopsy forceps. Resection and retrieval were                            complete. To prevent bleeding after the biopsy, one                            hemostatic clip was successfully placed (MR                            conditional). There was no bleeding at the end of                            the procedure.                           A few small-mouthed diverticula were found in the                            sigmoid colon.                           A few scattered non-bleeding erosions were found in                            the rectum. Biopsies were taken with a cold forceps                            for histology.                           External and internal hemorrhoids were found during                            retroflexion. The hemorrhoids  were large. Complications:            No immediate complications. Estimated Blood Loss:     Estimated blood loss was minimal. Impression:               - One 5 mm polyp in the cecum, removed with a cold                            snare. Resected and retrieved.                           - One 2 mm polyp in the transverse colon, removed                            with a cold biopsy forceps. Resected and retrieved.  Clip (MR conditional) was placed.                           - Diverticulosis in the sigmoid colon.                           - A few erosions in the rectum. Biopsied.                           - External and internal hemorrhoids. Recommendation:           - Patient has a contact number available for                            emergencies. The signs and symptoms of potential                            delayed complications were discussed with the                            patient. Return to normal activities tomorrow.                            Written discharge instructions were provided to the                            patient.                           - Resume previous diet.                           - Continue present medications.                           - Await pathology results.                           - No aspirin, ibuprofen, naproxen, or other                            non-steroidal anti-inflammatory drugs.                           - Repeat colonoscopy in 5 years for surveillance                            based on pathology results.                           - Return to GI clinic at appointment to be                            scheduled. Mauri Pole, MD 07/04/2018 2:17:19 PM This report has been signed electronically.

## 2018-07-04 NOTE — Progress Notes (Signed)
A and O x3. Report to RN. Tolerated MAC anesthesia well.

## 2018-07-04 NOTE — Progress Notes (Signed)
Called to room to assist during endoscopic procedure.  Patient ID and intended procedure confirmed with present staff. Received instructions for my participation in the procedure from the performing physician.  

## 2018-07-04 NOTE — Patient Instructions (Signed)
YOU HAD AN ENDOSCOPIC PROCEDURE TODAY AT [facility]:   Refer to the procedure report that was given to you for any specific questions about what was found during the examination.  If the procedure report does not answer your questions, please call your gastroenterologist to clarify.  If you requested that your care partner not be given the details of your procedure findings, then the procedure report has been included in a sealed envelope for you to review at your convenience later.  YOU SHOULD EXPECT: Some feelings of bloating in the abdomen. Passage of more gas than usual.  Walking can help get rid of the air that was put into your GI tract during the procedure and reduce the bloating. If you had a lower endoscopy (such as a colonoscopy or flexible sigmoidoscopy) you may notice spotting of blood in your stool or on the toilet paper. If you underwent a bowel prep for your procedure, you may not have a normal bowel movement for a few days.  Please Note:  You might notice some irritation and congestion in your nose or some drainage.  This is from the oxygen used during your procedure.  There is no need for concern and it should clear up in a day or so.  SYMPTOMS TO REPORT IMMEDIATELY:   Following lower endoscopy (colonoscopy or flexible sigmoidoscopy):  Excessive amounts of blood in the stool  Significant tenderness or worsening of abdominal pains  Swelling of the abdomen that is new, acute  Fever of 100F or higher  For urgent or emergent issues, a gastroenterologist can be reached at any hour by calling [phone].   DIET:  We do recommend a small meal at first, but then you may proceed to your regular diet.  Drink plenty of fluids but you should avoid alcoholic beverages for 24 hours.  ACTIVITY:  You should plan to take it easy for the rest of today and you should NOT DRIVE or use heavy machinery until tomorrow (because of the sedation medicines used during the test).     FOLLOW UP: Our staff will call the number listed on your records the next business day following your procedure to check on you and address any questions or concerns that you may have regarding the information given to you following your procedure. If we do not reach you, we will leave a message.  However, if you are feeling well and you are not experiencing any problems, there is no need to return our call.  We will assume that you have returned to your regular daily activities without incident.  If any biopsies were taken you will be contacted by phone or by letter within the next 1-3 weeks.  Please call us at [phone] if you have not heard about the biopsies in 3 weeks.   Await for biopsy results No ibuprofen, naproxen or other non-steroidal anti-inflammatory drugs. Hemorrhoids (handout given) Diverticulosis (handoutgiven) Office to call and schedule a follow up appointment  SIGNATURES/CONFIDENTIALITY: You and/or your care partner have signed paperwork which will be entered into your electronic medical record.  These signatures attest to the fact that that the information above on your After Visit Summary has been reviewed and is understood.  Full responsibility of the confidentiality of this discharge information lies with you and/or your care-partner.

## 2018-07-05 ENCOUNTER — Telehealth: Payer: Self-pay

## 2018-07-05 ENCOUNTER — Telehealth: Payer: Self-pay | Admitting: *Deleted

## 2018-07-05 MED ORDER — SUMATRIPTAN 20 MG/ACT NA SOLN: 20.0000 mg | NASAL | 5 refills | Status: DC | PRN

## 2018-07-05 NOTE — Telephone Encounter (Signed)
  Follow up Call-  Call back number 07/04/2018 05/10/2017  Post procedure Call Back phone  # [phone]  Permission to leave phone message Yes Yes  Some recent data might be hidden     Patient questions:  Message left to call us if necessary. Second call.

## 2018-07-05 NOTE — Addendum Note (Signed)
Addended by: [name] on: 07/05/2018 05:10 PM   Modules accepted: Orders

## 2018-07-05 NOTE — Telephone Encounter (Signed)
  Follow up Call-  Call back number 07/04/2018 05/10/2017  Post procedure Call Back phone  # [phone]  Permission to leave phone message Yes Yes  Some recent data might be hidden     Left message

## 2018-07-09 ENCOUNTER — Other Ambulatory Visit: Payer: Self-pay

## 2018-07-11 ENCOUNTER — Encounter: Payer: Self-pay | Admitting: Internal Medicine

## 2018-07-12 ENCOUNTER — Telehealth: Payer: Self-pay | Admitting: Gastroenterology

## 2018-07-12 NOTE — Telephone Encounter (Signed)
[facility] trying to rf [facility], it needs PA. [facility] stated to have tried to faxed it several times but fax keeps failing so they are looking for a verbal authorization.

## 2018-07-12 NOTE — Telephone Encounter (Signed)
Prior auth approved until 07/12/2019  Called pharmacy to inform

## 2018-07-16 ENCOUNTER — Encounter: Payer: Self-pay | Admitting: Gastroenterology

## 2018-07-19 DIAGNOSIS — Z6827 Body mass index (BMI) 27.0-27.9, adult: Secondary | ICD-10-CM | POA: Diagnosis not present

## 2018-07-19 DIAGNOSIS — M533 Sacrococcygeal disorders, not elsewhere classified: Secondary | ICD-10-CM | POA: Diagnosis not present

## 2018-07-26 ENCOUNTER — Other Ambulatory Visit: Payer: Self-pay | Admitting: Internal Medicine

## 2018-07-26 DIAGNOSIS — M5126 Other intervertebral disc displacement, lumbar region: Secondary | ICD-10-CM

## 2018-07-26 DIAGNOSIS — M545 Low back pain: Secondary | ICD-10-CM

## 2018-07-26 NOTE — Telephone Encounter (Signed)
MD approved and sent electronically to [facility]mb  

## 2018-07-26 NOTE — Telephone Encounter (Signed)
Check [place] registry last filled 06/05/2018.[name] KitchenJohny Chess

## 2018-08-05 ENCOUNTER — Other Ambulatory Visit: Payer: Self-pay | Admitting: Gastroenterology

## 2018-08-09 ENCOUNTER — Encounter: Payer: Self-pay | Admitting: Internal Medicine

## 2018-08-21 ENCOUNTER — Other Ambulatory Visit: Payer: Self-pay | Admitting: Internal Medicine

## 2018-08-27 ENCOUNTER — Ambulatory Visit: Payer: Federal, State, Local not specified - PPO | Admitting: Internal Medicine

## 2018-08-27 ENCOUNTER — Encounter: Payer: Self-pay | Admitting: Internal Medicine

## 2018-08-27 ENCOUNTER — Ambulatory Visit (INDEPENDENT_AMBULATORY_CARE_PROVIDER_SITE_OTHER)
Admission: RE | Admit: 2018-08-27 | Discharge: 2018-08-27 | Disposition: A | Discharge status: Home / Self Care | Payer: Federal, State, Local not specified - PPO | Source: Ambulatory Visit | Attending: Internal Medicine | Admitting: Internal Medicine

## 2018-08-27 VITALS — BP 114/80 | HR 112 | Temp 98.9°F | Ht 67.0 in | Wt 184.8 lb

## 2018-08-27 DIAGNOSIS — S61051A Open bite of right thumb without damage to nail, initial encounter: Secondary | ICD-10-CM

## 2018-08-27 DIAGNOSIS — W5911XA Bitten by nonvenomous snake, initial encounter: Secondary | ICD-10-CM

## 2018-08-27 DIAGNOSIS — Z23 Encounter for immunization: Secondary | ICD-10-CM | POA: Diagnosis not present

## 2018-08-27 DIAGNOSIS — T63001A Toxic effect of unspecified snake venom, accidental (unintentional), initial encounter: Secondary | ICD-10-CM | POA: Diagnosis not present

## 2018-08-27 MED ORDER — FLUCONAZOLE 150 MG PO TABS: 150.0000 mg | ORAL_TABLET | Freq: Once | ORAL | 0 refills | Status: AC

## 2018-08-27 MED ORDER — AMOXICILLIN-POT CLAVULANATE 875-125 MG PO TABS: 1.0000 | ORAL_TABLET | Freq: Two times a day (BID) | ORAL | 0 refills | Status: DC

## 2018-08-27 NOTE — Progress Notes (Signed)
Subjective:    Patient ID: Pamela Novak, female    DOB: November 23, 1970, 47 y.o.   MRN: [id number]  HPI The patient is here for an acute visit.  This morning she was bit by 1 of her pet python's.  They were trying to help this may get this may just reacted and bit her right thumb.  The 2 large things went into the posterior thumb and is smaller teeth on the anterior aspect of the thumb.  It took a while to get dyspneic off of her thumb, but afterwards it did bleed a lot.  While the snake bit her it was extremely painful, but the pain has gotten better.  She had more significant swelling initially this morning, but has improved and now is only mildly swollen.  She denies any numbness or tingling.  She is able to bend her thumb, but the range of motion is decreased at the interphalangeal joint and it is painful to bend it.  There is some bruising.  She has not had any fevers or chills.     Medications and allergies reviewed with patient and updated if appropriate.  Patient Active Problem List   Diagnosis Date Noted  . Migraine without aura and with status migrainosus, not intractable 04/12/2018  . Bilateral low back pain 03/21/2018  . Lumbar disc herniation 03/21/2018  . Prediabetes 03/23/2017  . Seborrheic dermatitis of scalp 03/23/2017  . Hyperlipidemia 01/21/2016  . Depression 01/18/2016  . PTSD (post-traumatic stress disorder) 01/18/2016  . GERD (gastroesophageal reflux disease) 01/18/2016  . Varicose veins 05/21/2013  . GAD (generalized anxiety disorder) 11/02/2012    Current Outpatient Medications on File Prior to Visit  Medication Sig Dispense Refill  . AMITIZA 8 MCG capsule TAKE 1 CAPSULE (8 MCG TOTAL) BY MOUTH 2 (TWO) TIMES DAILY WITH A MEAL. 60 capsule 0  . atorvastatin (LIPITOR) 20 MG tablet Take 1 tablet (20 mg total) by mouth daily. 90 tablet 3  . buPROPion (WELLBUTRIN XL) 150 MG 24 hr tablet Take 150 mg by mouth daily.    . cyclobenzaprine (FLEXERIL) 5 MG tablet TAKE 1  TABLET BY MOUTH THREE TIMES A DAY AS NEEDED FOR MUSCLE SPASMS 45 tablet 0  . DULoxetine (CYMBALTA) 30 MG capsule Take 90 mg by mouth daily.     [name] Kitchen EPINEPHrine (AUVI-Q) 0.3 mg/0.3 mL IJ SOAJ injection Inject 0.3 mg into the muscle as needed.    . gabapentin (NEURONTIN) 100 MG capsule Take 1 capsule (100 mg total) by mouth at bedtime. Increase up to 300 mg at night if needed 90 capsule 1  . hydrocortisone (ANUSOL-HC) 2.5 % rectal cream Place 1 application rectally 2 (two) times daily. 30 g 1  . ketoconazole (NIZORAL) 2 % shampoo APPLY 1 APPLICATION TOPICALLY 2 (TWO) TIMES A WEEK. 120 mL 0  . pantoprazole (PROTONIX) 40 MG tablet Take 1 tablet (40 mg total) by mouth daily. 90 tablet 3  . ranitidine (ZANTAC) 150 MG tablet Take 1 tablet (150 mg total) by mouth at bedtime. -- Office visit needed for further 90 tablet 3  . SUMAtriptan (IMITREX) 20 MG/ACT nasal spray Place 1 spray (20 mg total) into the nose every 2 (two) hours as needed for migraine. May repeat in 2 hrs if headache persists or recurs. 1 Inhaler 5  . traMADol (ULTRAM) 50 MG tablet TAKE 1 TABLET (50 MG TOTAL) BY MOUTH EVERY 8 (EIGHT) HOURS AS NEEDED. 21 tablet 1  . zolmitriptan (ZOMIG) 5 MG nasal solution Place 1  spray into the nose as needed for migraine. 6 Units 3   Current Facility-Administered Medications on File Prior to Visit  Medication Dose Route Frequency Provider Last Rate Last Dose  . 0.9 %  sodium chloride infusion  500 mL Intravenous Once Mauri Pole, MD        Past Medical History:  Diagnosis Date  . Allergy   . Anxiety   . Arthritis   . Blood in stool   . Breast hematoma   . Complication of anesthesia    "woke up during colonoscopy"  . Depression   . GERD (gastroesophageal reflux disease)   . Hyperlipidemia   . Traumatic hematoma of knee     Past Surgical History:  Procedure Laterality Date  . ABLATION ON ENDOMETRIOSIS  10/04/2012   Procedure: ABLATION ON ENDOMETRIOSIS;  Surgeon: Linda Hedges, DO;   Location: [facility];  Service: Gynecology;  Laterality: N/A;  . COLONOSCOPY    . CONDYLOMA EXCISION/FULGURATION  10/04/2012   Procedure: CONDYLOMA REMOVAL;  Surgeon: Linda Hedges, DO;  Location: [facility];  Service: Gynecology;  Laterality: N/A;  . DILITATION & CURRETTAGE/HYSTROSCOPY WITH NOVASURE ABLATION  10/04/2012   Procedure: DILATATION & CURETTAGE/HYSTEROSCOPY WITH NOVASURE ABLATION;  Surgeon: Linda Hedges, DO;  Location: [facility];  Service: Gynecology;  Laterality: N/A;  . LAPAROSCOPY  10/04/2012   Procedure: LAPAROSCOPY OPERATIVE;  Surgeon: Linda Hedges, DO;  Location: [facility];  Service: Gynecology;  Laterality: N/A;    Social History   Socioeconomic History  . Marital status: Married    Spouse name: Not on file  . Number of children: 2  . Years of education: 12+  . Highest education level: Not on file  Occupational History  . Occupation: adm Radiation protection practitioner: [employer]  . Financial resource strain: Not on file  . Food insecurity:    Worry: Not on file    Inability: Not on file  . Transportation needs:    Medical: Not on file    Non-medical: Not on file  Tobacco Use  . Smoking status: Never Smoker  . Smokeless tobacco: Never Used  Substance and Sexual Activity  . Alcohol use: Yes    Comment: rare  . Drug use: No  . Sexual activity: Yes  Lifestyle  . Physical activity:    Days per week: Not on file    Minutes per session: Not on file  . Stress: Not on file  Relationships  . Social connections:    Talks on phone: Not on file    Gets together: Not on file    Attends religious service: Not on file    Active member of club or organization: Not on file    Attends meetings of clubs or organizations: Not on file    Relationship status: Not on file  Other Topics Concern  . Not on file  Social History Narrative   Regular exercise-no   Caffeine Use-yes    Family History  Problem Relation Age of Onset  . Heart disease Mother   . Arthritis  Mother   . Hyperlipidemia Mother   . Hypertension Mother   . Heart attack Mother   . Cancer Father        Colon Cancer-Polyps  . Hypertension Father   . Hashimoto's thyroiditis Sister   . Diabetes Sister   . Thyroid disease Sister   . Cancer Paternal Grandfather        Colon Cancer  . Colon cancer Paternal Grandfather   .  Hashimoto's thyroiditis Sister   . Thyroid disease Sister     Review of Systems  Constitutional: Negative for chills and fever.  Skin: Positive for color change and wound.  Neurological: Negative for numbness.       Objective:   Vitals:   08/27/18 1119  BP: 114/80  Pulse: (!) 112  Temp: 98.9 F (37.2 C)  SpO2: 97%   BP Readings from Last 3 Encounters:  08/27/18 114/80  07/04/18 114/69  06/26/18 112/74   Wt Readings from Last 3 Encounters:  08/27/18 184 lb 12.8 oz (83.8 kg)  07/04/18 177 lb (80.3 kg)  06/26/18 177 lb 8 oz (80.5 kg)   Body mass index is 28.94 kg/m.   Physical Exam  Constitutional: She appears well-developed and well-nourished. No distress.  Cardiovascular:  Tip of finger normal color and warm, normal radial pulse  Musculoskeletal:  Right thumb with 2 small puncture wounds posterior aspect of the thumb near the interphalangeal joint with mild swelling and bruising, no active bleeding or discharge, several small teeth marks around the anterior aspect of the thumb.  Pain with active and passive flexion.  Neurological: No sensory deficit (Right thumb).  Skin: Skin is warm and dry. She is not diaphoretic.         Assessment & Plan:    See Problem List for Assessment and Plan of chronic medical problems.

## 2018-08-27 NOTE — Patient Instructions (Signed)
Take the antibiotic as prevention.  Use the diflucan if needed.   A referral was ordered hand surgery.  They will call you.      Snake Bite Snakes may be poisonous (venomous) or nonpoisonous (nonvenomous). A bite from a nonvenomous snake may cause a wound to the skin and possibly to the deeper tissues beneath the skin. A venomous snake will cause a wound and may also inject poison (venom) into the wound. The effects of snake venom vary depending on the type of snake. In some cases, the effects can be extremely serious or even deadly. A bite from a venomous snake is a medical emergency. Treatment may require the use of antivenom medicine. What are the signs or symptoms? Symptoms of a snake bite vary depending on the type of snake, whether the snake is venomous, and the severity of the bite. Symptoms for both a venomous or nonvenomous snake may include:  Pain, redness, and swelling at the site of the bite.  Skin discoloration at the site of the bite.  A feeling of nervousness.  Symptoms of a venomous snake bite may also include:  Increasing pain and swelling.  Severe anxiety or confusion.  Blood blisters or purple spots in the bite area.  Nausea and vomiting.  Numbness or tingling.  Muscle weakness.  Excessive fatigue or drowsiness.  Excessive sweating.  Difficulty breathing.  Blurred vision.  Bruising and bleeding at the site of the bite.  Feeling faint or light-headed.  In some cases, symptoms do not develop until a few hours after the bite. How is this diagnosed? This condition may be diagnosed based on symptoms and a physical exam. Your health care provider will examine the bite area and ask for details about the snake to help determine whether it is venomous. You may also have tests, including blood tests. How is this treated? Treatment depends on the severity of the bite and whether the snake is venomous.  Treatment for nonvenomous snake bites may involve basic  wound care. This often includes cleaning the wound and applying a bandage (dressing). In some cases, antibiotic medicine or a tetanus shot may be given.  Treatment for venomous snake bites may include antivenom medicine in addition to wound care. This medicine needs to be given as soon as possible after the bite. Other treatments may be needed to help control symptoms as they develop. You may need to stay in a hospital so your condition can be monitored.  Follow these instructions at home: Wound care  Follow instructions from your health care provider about how to take care of your wound. Make sure you: ? Wash your hands with soap and water before you change your dressing. If soap and water are not available, use hand sanitizer. ? Change your dressing as told by your health care provider.  Keep the bite area clean and dry. Wash the bite area daily with soap and water or an antiseptic as told by your health care provider.  Check your wound every day for signs of infection. Watch for: ? Redness, swelling, or pain that is getting worse. ? Fluid, blood, or pus.  If you develop blistering at the site of the bite, protect the blisters from breaking. Do not attempt to open a blister. Medicines  Take or apply over-the-counter and prescription medicines only as told by your health care provider.  If you were prescribed an antibiotic, take or apply it as told by your health care provider. Do not stop using the antibiotic  even if your condition improves. General instructions  Keep the affected area raised (elevated) above the level of your heart while you are sitting or lying down, if possible.  Keep all follow-up visits as told by your health care provider. This is important. Contact a health care provider if:  You have increased redness, swelling, or pain at the site of your wound.  You have fluid, blood, or pus coming from your wound.  You have a fever. Get help right away if:  You  develop blood blisters or purple spots in the bite area.  You have nausea or vomiting.  You have numbness or tingling.  You have excessive sweating.  You have trouble breathing.  You have vision problems.  You feel very confused.  You feel faint or light-headed. This information is not intended to replace advice given to you by your health care provider. Make sure you discuss any questions you have with your health care provider. Document Released: 10/14/2000 Document Revised: 03/22/2016 Document Reviewed: 03/04/2015 Elsevier Interactive Patient Education  2018 [employer].

## 2018-08-27 NOTE — Assessment & Plan Note (Addendum)
Secondary to python bite After reviewing up-to-date it does not look like prophylactic antibiotics are warranted, but but given the depth of the puncture we will start prophylactic antibiotics - Augmentin twice daily x10 days Tetanus up-to-date We will get an x-ray today to make sure there is no bony damage or portion of the snake's tooth in the thumb Will refer to hand orthopedics Pain tolerable and she does have tramadol at home that she can take if needed

## 2018-08-27 NOTE — Assessment & Plan Note (Signed)
Secondary to python bite After reviewing up-to-date it does not look like prophylactic antibiotics are warranted, but but given the depth of the puncture we will start prophylactic antibiotics - Augmentin twice daily x10 days Tetanus up-to-date We will get an x-ray today to make sure there is no bony damage or portion of the snake's tooth in the thumb Will refer to hand orthopedics Pain tolerable and she does have tramadol at home that she can take if needed

## 2018-08-28 ENCOUNTER — Encounter: Payer: Self-pay | Admitting: Internal Medicine

## 2018-08-28 DIAGNOSIS — M533 Sacrococcygeal disorders, not elsewhere classified: Secondary | ICD-10-CM | POA: Diagnosis not present

## 2018-08-29 ENCOUNTER — Encounter: Payer: Self-pay | Admitting: Gastroenterology

## 2018-08-29 ENCOUNTER — Ambulatory Visit: Payer: Federal, State, Local not specified - PPO | Admitting: Gastroenterology

## 2018-08-29 VITALS — BP 110/74 | HR 112 | Ht 67.0 in | Wt 183.0 lb

## 2018-08-29 DIAGNOSIS — K642 Third degree hemorrhoids: Secondary | ICD-10-CM

## 2018-08-29 MED ORDER — LUBIPROSTONE 8 MCG PO CAPS: ORAL_CAPSULE | ORAL | 5 refills | Status: DC

## 2018-08-29 NOTE — Progress Notes (Signed)
PROCEDURE NOTE: The patient presents with symptomatic grade III  hemorrhoids, requesting rubber band ligation of his/her hemorrhoidal disease.  All risks, benefits and alternative forms of therapy were described and informed consent was obtained.  In the Left Lateral Decubitus position anoscopic examination revealed grade II-III hemorrhoids in the Left lateral , right posterior and right anterior position(s).  The anorectum was pre-medicated with 0.125% Nitroglycerine and Recticare The decision was made to band the  internal hemorrhoid, and the [place] was used to perform band ligation without complication.  Digital anorectal examination was then performed to assure proper positioning of the band, and to adjust the banded tissue as required.  The patient was discharged home without pain or other issues.  Dietary and behavioral recommendations were given and along with follow-up instructions.     The following adjunctive treatments were recommended: Benefiber 1 teaspoon TID Amitiza 71mcg BID  The patient will return in 2-4 weeks for  follow-up and possible additional banding as required. No complications were encountered and the patient tolerated the procedure well.  Damaris Hippo , MD [phone]

## 2018-08-29 NOTE — Patient Instructions (Addendum)
We have sent the following medications to your pharmacy for you to pick up at your convenience:Amitiza to [facility] on [address].  HEMORRHOID BANDING PROCEDURE    FOLLOW-UP CARE   1. The procedure you have had should have been relatively painless since the banding of the area involved does not have nerve endings and there is no pain sensation.  The rubber band cuts off the blood supply to the hemorrhoid and the band may fall off as soon as 48 hours after the banding (the band may occasionally be seen in the toilet bowl following a bowel movement). You may notice a temporary feeling of fullness in the rectum which should respond adequately to plain Tylenol or Motrin.  2. Following the banding, avoid strenuous exercise that evening and resume full activity the next day.  A sitz bath (soaking in a warm tub) or bidet is soothing, and can be useful for cleansing the area after bowel movements.     3. To avoid constipation, take two tablespoons of natural wheat bran, natural oat bran, flax, Benefiber or any over the counter fiber supplement and increase your water intake to 7-8 glasses daily.    4. Unless you have been prescribed anorectal medication, do not put anything inside your rectum for two weeks: No suppositories, enemas, fingers, etc.  5. Occasionally, you may have more bleeding than usual after the banding procedure.  This is often from the untreated hemorrhoids rather than the treated one.  Don't be concerned if there is a tablespoon or so of blood.  If there is more blood than this, lie flat with your bottom higher than your head and apply an ice pack to the area. If the bleeding does not stop within a half an hour or if you feel faint, call our office at [phone] or go to the emergency room.  6. Problems are not common; however, if there is a substantial amount of bleeding, severe pain, chills, fever or difficulty passing urine (very rare) or other problems, you should call us at  [phone] or report to the nearest emergency room.  7. Do not stay seated continuously for more than 2-3 hours for a day or two after the procedure.  Tighten your buttock muscles 10-15 times every two hours and take 10-15 deep breaths every 1-2 hours.  Do not spend more than a few minutes on the toilet if you cannot empty your bowel; instead re-visit the toilet at a later time.

## 2018-09-10 ENCOUNTER — Encounter: Payer: Self-pay | Admitting: Internal Medicine

## 2018-09-11 MED ORDER — NYSTATIN 100000 UNIT/ML MT SUSP: 5.0000 mL | Freq: Four times a day (QID) | OROMUCOSAL | 0 refills | Status: DC

## 2018-09-16 NOTE — Addendum Note (Signed)
Addended by: [name] on: 09/16/2018 02:44 PM   Modules accepted: Orders

## 2018-09-17 MED ORDER — FLUCONAZOLE 100 MG PO TABS: 100.0000 mg | ORAL_TABLET | Freq: Every day | ORAL | 0 refills | Status: DC

## 2018-09-17 NOTE — Addendum Note (Signed)
Addended by: [name] on: 09/17/2018 01:01 PM   Modules accepted: Orders

## 2018-09-19 ENCOUNTER — Encounter: Payer: Self-pay | Admitting: Gastroenterology

## 2018-09-19 ENCOUNTER — Ambulatory Visit: Payer: Federal, State, Local not specified - PPO | Admitting: Gastroenterology

## 2018-09-19 VITALS — BP 114/62 | HR 86 | Ht 67.0 in | Wt 179.0 lb

## 2018-09-19 DIAGNOSIS — K642 Third degree hemorrhoids: Secondary | ICD-10-CM | POA: Diagnosis not present

## 2018-09-19 NOTE — Patient Instructions (Addendum)
HEMORRHOID BANDING PROCEDURE    FOLLOW-UP CARE   1. The procedure you have had should have been relatively painless since the banding of the area involved does not have nerve endings and there is no pain sensation.  The rubber band cuts off the blood supply to the hemorrhoid and the band may fall off as soon as 48 hours after the banding (the band may occasionally be seen in the toilet bowl following a bowel movement). You may notice a temporary feeling of fullness in the rectum which should respond adequately to plain Tylenol or Motrin.  2. Following the banding, avoid strenuous exercise that evening and resume full activity the next day.  A sitz bath (soaking in a warm tub) or bidet is soothing, and can be useful for cleansing the area after bowel movements.     3. To avoid constipation, take two tablespoons of natural wheat bran, natural oat bran, flax, Benefiber or any over the counter fiber supplement and increase your water intake to 7-8 glasses daily.    4. Unless you have been prescribed anorectal medication, do not put anything inside your rectum for two weeks: No suppositories, enemas, fingers, etc.  5. Occasionally, you may have more bleeding than usual after the banding procedure.  This is often from the untreated hemorrhoids rather than the treated one.  Don't be concerned if there is a tablespoon or so of blood.  If there is more blood than this, lie flat with your bottom higher than your head and apply an ice pack to the area. If the bleeding does not stop within a half an hour or if you feel faint, call our office at [phone] or go to the emergency room.  6. Problems are not common; however, if there is a substantial amount of bleeding, severe pain, chills, fever or difficulty passing urine (very rare) or other problems, you should call us at [phone] or report to the nearest emergency room.  7. Do not stay seated continuously for more than 2-3 hours for a day or two  after the procedure.  Tighten your buttock muscles 10-15 times every two hours and take 10-15 deep breaths every 1-2 hours.  Do not spend more than a few minutes on the toilet if you cannot empty your bowel; instead re-visit the toilet at a later time.   Kegel Exercises Kegel exercises help strengthen the muscles that support the rectum, vagina, small intestine, bladder, and uterus. Doing Kegel exercises can help:  Improve bladder and bowel control.  Improve sexual response.  Reduce problems and discomfort during pregnancy.  Kegel exercises involve squeezing your pelvic floor muscles, which are the same muscles you squeeze when you try to stop the flow of urine. The exercises can be done while sitting, standing, or lying down, but it is best to vary your position. Phase 1 exercises 1. Squeeze your pelvic floor muscles tight. You should feel a tight lift in your rectal area. If you are a female, you should also feel a tightness in your vaginal area. Keep your stomach, buttocks, and legs relaxed. 2. Hold the muscles tight for up to 10 seconds. 3. Relax your muscles. Repeat this exercise 50 times a day or as many times as told by your health care provider. Continue to do this exercise for at least 4-6 weeks or for as long as told by your health care provider. This information is not intended to replace advice given to you by your health care provider.  Make sure you discuss any questions you have with your health care provider. Document Released: 10/03/2012 Document Revised: 06/11/2016 Document Reviewed: 09/06/2015 Elsevier Interactive Patient Education  [employer].

## 2018-09-19 NOTE — Progress Notes (Signed)
PROCEDURE NOTE: The patient presents with symptomatic grade III hemorrhoids, requesting rubber band ligation of his/her hemorrhoidal disease.  All risks, benefits and alternative forms of therapy were described and informed consent was obtained.   The anorectum was pre-medicated with 0.125% nitroglycerine and Recticare The decision was made to band the Right posterior internal hemorrhoid, and the [place] was used to perform band ligation without complication.  Digital anorectal examination was then performed to assure proper positioning of the band, and to adjust the banded tissue as required.  The patient was discharged home without pain or other issues.  Dietary and behavioral recommendations were given and along with follow-up instructions.     The following adjunctive treatments were recommended: Kegel exercises  The patient will return in 2-4 weeks for  follow-up and possible additional banding as required. No complications were encountered and the patient tolerated the procedure well.  Damaris Hippo , MD [phone]

## 2018-09-21 ENCOUNTER — Ambulatory Visit: Payer: Federal, State, Local not specified - PPO | Admitting: Internal Medicine

## 2018-09-21 ENCOUNTER — Ambulatory Visit (INDEPENDENT_AMBULATORY_CARE_PROVIDER_SITE_OTHER)
Admission: RE | Admit: 2018-09-21 | Discharge: 2018-09-21 | Disposition: A | Discharge status: Home / Self Care | Payer: Federal, State, Local not specified - PPO | Source: Ambulatory Visit | Attending: Internal Medicine | Admitting: Internal Medicine

## 2018-09-21 ENCOUNTER — Encounter: Payer: Self-pay | Admitting: Internal Medicine

## 2018-09-21 VITALS — BP 128/74 | HR 112 | Temp 98.5°F | Resp 16 | Ht 67.0 in | Wt 182.0 lb

## 2018-09-21 DIAGNOSIS — M542 Cervicalgia: Secondary | ICD-10-CM | POA: Diagnosis not present
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
## 2018-09-21 NOTE — Patient Instructions (Addendum)
Have a neck x-ray today.    Take flexeril 10 mg at night as needed.   You can take tramadol and tylenol for the more severe pain.    Try the gabapentin.

## 2018-09-24 ENCOUNTER — Encounter: Payer: Self-pay | Admitting: Internal Medicine

## 2018-09-25 MED ORDER — FLUCONAZOLE 100 MG PO TABS: 100.0000 mg | ORAL_TABLET | Freq: Every day | ORAL | 0 refills | Status: AC

## 2018-10-01 DIAGNOSIS — M533 Sacrococcygeal disorders, not elsewhere classified: Secondary | ICD-10-CM | POA: Diagnosis not present

## 2018-10-10 ENCOUNTER — Ambulatory Visit: Payer: Federal, State, Local not specified - PPO | Admitting: Gastroenterology

## 2018-10-10 ENCOUNTER — Encounter: Payer: Self-pay | Admitting: Gastroenterology

## 2018-10-10 VITALS — BP 110/80 | HR 76 | Ht 67.0 in | Wt 181.0 lb

## 2018-10-10 DIAGNOSIS — K642 Third degree hemorrhoids: Secondary | ICD-10-CM | POA: Diagnosis not present

## 2018-10-10 NOTE — Progress Notes (Signed)
PROCEDURE NOTE: The patient presents with symptomatic grade III  hemorrhoids, requesting rubber band ligation of his/her hemorrhoidal disease.  All risks, benefits and alternative forms of therapy were described and informed consent was obtained.   The anorectum was pre-medicated with 0.125% Nitroglycerine and recticare The decision was made to band the Right anterior internal hemorrhoid, and the [place] was used to perform band ligation , but unable to place the band appropriately . Digital anorectal examination and anoscopic exam was then performed , right anterior grade III hemorrhoid with no nodules or ulceration.   The patient was discharged home without pain or other issues.  Dietary and behavioral recommendations were given and along with follow-up instructions.  Will refer to colorectal surgery for management of symptomatic grade III hemorrhoids   No complications were encountered and the patient tolerated the procedure well.  Damaris Hippo , MD [phone]

## 2018-10-10 NOTE — Patient Instructions (Addendum)
We will refer you to [facility] for hemorrhoids and contact you with that appointment  If you are age 49 or older, your body mass index should be between 23-30. Your Body mass index is 28.35 kg/m. If this is out of the aforementioned range listed, please consider follow up with your Primary Care Provider.  If you are age 19 or younger, your body mass index should be between 19-25. Your Body mass index is 28.35 kg/m. If this is out of the aformentioned range listed, please consider follow up with your Primary Care Provider.    Thank you for choosing [facility]  Karleen Hampshire Nand[place]am,MD

## 2018-10-11 ENCOUNTER — Telehealth: Payer: Self-pay | Admitting: *Deleted

## 2018-10-11 NOTE — Telephone Encounter (Signed)
Called and left message for referral coordinator to call me back, need to try and get pt in to be seen before the end of the year  Waiting on call back  Referral in [place]

## 2018-10-16 NOTE — Telephone Encounter (Signed)
[facility] Has referral and has contacted the patient to schedule but she has not returned their call yet to schedule

## 2018-11-06 ENCOUNTER — Other Ambulatory Visit: Payer: Self-pay | Admitting: Internal Medicine

## 2018-11-13 DIAGNOSIS — K641 Second degree hemorrhoids: Secondary | ICD-10-CM | POA: Diagnosis not present

## 2018-11-15 NOTE — Patient Instructions (Addendum)
Tests ordered today. Your results will be released to MyChart (or called to you) after review, usually within 72hours after test completion. If any changes need to be made, you will be notified at that same time.  All other Health Maintenance issues reviewed.   All recommended immunizations and age-appropriate screenings are up-to-date or discussed.  No immunizations administered today.   Medications reviewed and updated.  Changes include :   none  Your prescription(s) have been submitted to your pharmacy. Please take as directed and contact our office if you believe you are having problem(s) with the medication(s).   Please followup in 6 months   Health Maintenance, Female Adopting a healthy lifestyle and getting preventive care can go a long way to promote health and wellness. Talk with your health care provider about what schedule of regular examinations is right for you. This is a good chance for you to check in with your provider about disease prevention and staying healthy. In between checkups, there are plenty of things you can do on your own. Experts have done a lot of research about which lifestyle changes and preventive measures are most likely to keep you healthy. Ask your health care provider for more information. Weight and diet Eat a healthy diet  Be sure to include plenty of vegetables, fruits, low-fat dairy products, and lean protein.  Do not eat a lot of foods high in solid fats, added sugars, or salt.  Get regular exercise. This is one of the most important things you can do for your health. ? Most adults should exercise for at least 150 minutes each week. The exercise should increase your heart rate and make you sweat (moderate-intensity exercise). ? Most adults should also do strengthening exercises at least twice a week. This is in addition to the moderate-intensity exercise. Maintain a healthy weight  Body mass index (BMI) is a measurement that can be used to  identify possible weight problems. It estimates body fat based on height and weight. Your health care provider can help determine your BMI and help you achieve or maintain a healthy weight.  For females 20 years of age and older: ? A BMI below 18.5 is considered underweight. ? A BMI of 18.5 to 24.9 is normal. ? A BMI of 25 to 29.9 is considered overweight. ? A BMI of 30 and above is considered obese. Watch levels of cholesterol and blood lipids  You should start having your blood tested for lipids and cholesterol at 50 years of age, then have this test every 5 years.  You may need to have your cholesterol levels checked more often if: ? Your lipid or cholesterol levels are high. ? You are older than 50 years of age. ? You are at high risk for heart disease. Cancer screening Lung Cancer  Lung cancer screening is recommended for adults 55-80 years old who are at high risk for lung cancer because of a history of smoking.  A yearly low-dose CT scan of the lungs is recommended for people who: ? Currently smoke. ? Have quit within the past 15 years. ? Have at least a 30-pack-year history of smoking. A pack year is smoking an average of one pack of cigarettes a day for 1 year.  Yearly screening should continue until it has been 15 years since you quit.  Yearly screening should stop if you develop a health problem that would prevent you from having lung cancer treatment. Breast Cancer  Practice breast self-awareness. This means understanding how   how your breasts normally appear and feel.  It also means doing regular breast self-exams. Let your health care provider know about any changes, no matter how small.  If you are in your 20s or 30s, you should have a clinical breast exam (CBE) by a health care provider every 1-3 years as part of a regular health exam.  If you are 80 or older, have a CBE every year. Also consider having a breast X-ray (mammogram) every year.  If you have a family  history of breast cancer, talk to your health care provider about genetic screening.  If you are at high risk for breast cancer, talk to your health care provider about having an MRI and a mammogram every year.  Breast cancer gene (BRCA) assessment is recommended for women who have family members with BRCA-related cancers. BRCA-related cancers include: ? Breast. ? Ovarian. ? Tubal. ? Peritoneal cancers.  Results of the assessment will determine the need for genetic counseling and BRCA1 and BRCA2 testing. Cervical Cancer Your health care provider may recommend that you be screened regularly for cancer of the pelvic organs (ovaries, uterus, and vagina). This screening involves a pelvic examination, including checking for microscopic changes to the surface of your cervix (Pap test). You may be encouraged to have this screening done every 3 years, beginning at age 69.  For women ages 72-65, health care providers may recommend pelvic exams and Pap testing every 3 years, or they may recommend the Pap and pelvic exam, combined with testing for human papilloma virus (HPV), every 5 years. Some types of HPV increase your risk of cervical cancer. Testing for HPV may also be done on women of any age with unclear Pap test results.  Other health care providers may not recommend any screening for nonpregnant women who are considered low risk for pelvic cancer and who do not have symptoms. Ask your health care provider if a screening pelvic exam is right for you.  If you have had past treatment for cervical cancer or a condition that could lead to cancer, you need Pap tests and screening for cancer for at least 20 years after your treatment. If Pap tests have been discontinued, your risk factors (such as having a new sexual partner) need to be reassessed to determine if screening should resume. Some women have medical problems that increase the chance of getting cervical cancer. In these cases, your health care  provider may recommend more frequent screening and Pap tests. Colorectal Cancer  This type of cancer can be detected and often prevented.  Routine colorectal cancer screening usually begins at 50 years of age and continues through 49 years of age.  Your health care provider may recommend screening at an earlier age if you have risk factors for colon cancer.  Your health care provider may also recommend using home test kits to check for hidden blood in the stool.  A small camera at the end of a tube can be used to examine your colon directly (sigmoidoscopy or colonoscopy). This is done to check for the earliest forms of colorectal cancer.  Routine screening usually begins at age 46.  Direct examination of the colon should be repeated every 5-10 years through 50 years of age. However, you may need to be screened more often if early forms of precancerous polyps or small growths are found. Skin Cancer  Check your skin from head to toe regularly.  Tell your health care provider about any new moles or changes in moles,  if there is a change in a mole's shape or color.  Also tell your health care provider if you have a mole that is larger than the size of a pencil eraser.  Always use sunscreen. Apply sunscreen liberally and repeatedly throughout the day.  Protect yourself by wearing long sleeves, pants, a wide-brimmed hat, and sunglasses whenever you are outside. Heart disease, diabetes, and high blood pressure  High blood pressure causes heart disease and increases the risk of stroke. High blood pressure is more likely to develop in: ? People who have blood pressure in the high end of the normal range (130-139/85-89 mm Hg). ? People who are overweight or obese. ? People who are African American.  If you are 18-39 years of age, have your blood pressure checked every 3-5 years. If you are 40 years of age or older, have your blood pressure checked every year. You should have your  blood pressure measured twice-once when you are at a hospital or clinic, and once when you are not at a hospital or clinic. Record the average of the two measurements. To check your blood pressure when you are not at a hospital or clinic, you can use: ? An automated blood pressure machine at a pharmacy. ? A home blood pressure monitor.  If you are between 55 years and 79 years old, ask your health care provider if you should take aspirin to prevent strokes.  Have regular diabetes screenings. This involves taking a blood sample to check your fasting blood sugar level. ? If you are at a normal weight and have a low risk for diabetes, have this test once every three years after 50 years of age. ? If you are overweight and have a high risk for diabetes, consider being tested at a younger age or more often. Preventing infection Hepatitis B  If you have a higher risk for hepatitis B, you should be screened for this virus. You are considered at high risk for hepatitis B if: ? You were born in a country where hepatitis B is common. Ask your health care provider which countries are considered high risk. ? Your parents were born in a high-risk country, and you have not been immunized against hepatitis B (hepatitis B vaccine). ? You have HIV or AIDS. ? You use needles to inject street drugs. ? You live with someone who has hepatitis B. ? You have had sex with someone who has hepatitis B. ? You get hemodialysis treatment. ? You take certain medicines for conditions, including cancer, organ transplantation, and autoimmune conditions. Hepatitis C  Blood testing is recommended for: ? Everyone born from 1945 through 1965. ? Anyone with known risk factors for hepatitis C. Sexually transmitted infections (STIs)  You should be screened for sexually transmitted infections (STIs) including gonorrhea and chlamydia if: ? You are sexually active and are younger than 50 years of age. ? You are older than 50  years of age and your health care provider tells you that you are at risk for this type of infection. ? Your sexual activity has changed since you were last screened and you are at an increased risk for chlamydia or gonorrhea. Ask your health care provider if you are at risk.  If you do not have HIV, but are at risk, it may be recommended that you take a prescription medicine daily to prevent HIV infection. This is called pre-exposure prophylaxis (PrEP). You are considered at risk if: ? You are sexually active and do not   not regularly use condoms or know the HIV status of your partner(s). ? You take drugs by injection. ? You are sexually active with a partner who has HIV. Talk with your health care provider about whether you are at high risk of being infected with HIV. If you choose to begin PrEP, you should first be tested for HIV. You should then be tested every 3 months for as long as you are taking PrEP. Pregnancy  If you are premenopausal and you may become pregnant, ask your health care provider about preconception counseling.  If you may become pregnant, take 400 to 800 micrograms (mcg) of folic acid every day.  If you want to prevent pregnancy, talk to your health care provider about birth control (contraception). Osteoporosis and menopause  Osteoporosis is a disease in which the bones lose minerals and strength with aging. This can result in serious bone fractures. Your risk for osteoporosis can be identified using a bone density scan.  If you are 55 years of age or older, or if you are at risk for osteoporosis and fractures, ask your health care provider if you should be screened.  Ask your health care provider whether you should take a calcium or vitamin D supplement to lower your risk for osteoporosis.  Menopause may have certain physical symptoms and risks.  Hormone replacement therapy may reduce some of these symptoms and risks. Talk to your health care provider about whether  hormone replacement therapy is right for you. Follow these instructions at home:  Schedule regular health, dental, and eye exams.  Stay current with your immunizations.  Do not use any tobacco products including cigarettes, chewing tobacco, or electronic cigarettes.  If you are pregnant, do not drink alcohol.  If you are breastfeeding, limit how much and how often you drink alcohol.  Limit alcohol intake to no more than 1 drink per day for nonpregnant women. One drink equals 12 ounces of beer, 5 ounces of wine, or 1 ounces of hard liquor.  Do not use street drugs.  Do not share needles.  Ask your health care provider for help if you need support or information about quitting drugs.  Tell your health care provider if you often feel depressed.  Tell your health care provider if you have ever been abused or do not feel safe at home. This information is not intended to replace advice given to you by your health care provider. Make sure you discuss any questions you have with your health care provider. Document Released: 05/02/2011 Document Revised: 03/24/2016 Document Reviewed: 07/21/2015 Elsevier Interactive Patient Education  2019 [employer].

## 2018-11-15 NOTE — Progress Notes (Signed)
Subjective:    Patient ID: Pamela Novak, female    DOB: 01-12-1971, 48 y.o.   MRN: [id number]  HPI She is here for a physical exam.   She has not been compliant with a low sugar diet.    She is still having mild back pain.  She did have an injection and it is much better.  She has follow-up and may need a second injection.  Medications and allergies reviewed with patient and updated if appropriate.  Patient Active Problem List   Diagnosis Date Noted  . Neck pain 09/21/2018  . Bite, snake 08/27/2018  . Open bite of right thumb without damage to nail 08/27/2018  . Migraine without aura and with status migrainosus, not intractable 04/12/2018  . Bilateral low back pain 03/21/2018  . Lumbar disc herniation 03/21/2018  . Prediabetes 03/23/2017  . Seborrheic dermatitis of scalp 03/23/2017  . Hyperlipidemia 01/21/2016  . Depression 01/18/2016  . PTSD (post-traumatic stress disorder) 01/18/2016  . GERD (gastroesophageal reflux disease) 01/18/2016  . Varicose veins 05/21/2013  . GAD (generalized anxiety disorder) 11/02/2012    Current Outpatient Medications on File Prior to Visit  Medication Sig Dispense Refill  . atorvastatin (LIPITOR) 20 MG tablet Take 1 tablet (20 mg total) by mouth daily. 90 tablet 3  . buPROPion (WELLBUTRIN XL) 150 MG 24 hr tablet Take 150 mg by mouth daily.    . cyclobenzaprine (FLEXERIL) 5 MG tablet TAKE 1 TABLET BY MOUTH THREE TIMES A DAY AS NEEDED FOR MUSCLE SPASMS 45 tablet 0  . DULoxetine (CYMBALTA) 30 MG capsule Take 90 mg by mouth daily.     [name] Kitchen EPINEPHrine (AUVI-Q) 0.3 mg/0.3 mL IJ SOAJ injection Inject 0.3 mg into the muscle as needed.    . gabapentin (NEURONTIN) 100 MG capsule TAKE 1 CAPSULE BY MOUTH AT BEDTIME. INCREASE UP TO 300 MG AT NIGHT IF NEEDED 90 capsule 0  . hydrocortisone (ANUSOL-HC) 2.5 % rectal cream Place 1 application rectally 2 (two) times daily. 30 g 1  . ketoconazole (NIZORAL) 2 % shampoo APPLY 1 APPLICATION TOPICALLY 2 (TWO) TIMES  A WEEK. 120 mL 0  . lubiprostone (AMITIZA) 8 MCG capsule TAKE 1 CAPSULE (8 MCG TOTAL) BY MOUTH 2 (TWO) TIMES DAILY WITH A MEAL. 60 capsule 5  . pantoprazole (PROTONIX) 40 MG tablet Take 1 tablet (40 mg total) by mouth daily. 90 tablet 3  . ranitidine (ZANTAC) 150 MG tablet Take 1 tablet (150 mg total) by mouth at bedtime. -- Office visit needed for further 90 tablet 3  . SUMAtriptan (IMITREX) 20 MG/ACT nasal spray Place 1 spray (20 mg total) into the nose every 2 (two) hours as needed for migraine. May repeat in 2 hrs if headache persists or recurs. 1 Inhaler 5  . traMADol (ULTRAM) 50 MG tablet TAKE 1 TABLET (50 MG TOTAL) BY MOUTH EVERY 8 (EIGHT) HOURS AS NEEDED. 21 tablet 1  . zolmitriptan (ZOMIG) 5 MG nasal solution Place 1 spray into the nose as needed for migraine. 6 Units 3   No current facility-administered medications on file prior to visit.     Past Medical History:  Diagnosis Date  . Allergy   . Anxiety   . Arthritis   . Blood in stool   . Breast hematoma   . Complication of anesthesia    "woke up during colonoscopy"  . Depression   . GERD (gastroesophageal reflux disease)   . Hyperlipidemia   . Traumatic hematoma of knee  Past Surgical History:  Procedure Laterality Date  . ABLATION ON ENDOMETRIOSIS  10/04/2012   Procedure: ABLATION ON ENDOMETRIOSIS;  Surgeon: Linda Hedges, DO;  Location: [facility];  Service: Gynecology;  Laterality: N/A;  . COLONOSCOPY    . CONDYLOMA EXCISION/FULGURATION  10/04/2012   Procedure: CONDYLOMA REMOVAL;  Surgeon: Linda Hedges, DO;  Location: [facility];  Service: Gynecology;  Laterality: N/A;  . DILITATION & CURRETTAGE/HYSTROSCOPY WITH NOVASURE ABLATION  10/04/2012   Procedure: DILATATION & CURETTAGE/HYSTEROSCOPY WITH NOVASURE ABLATION;  Surgeon: Linda Hedges, DO;  Location: [facility];  Service: Gynecology;  Laterality: N/A;  . LAPAROSCOPY  10/04/2012   Procedure: LAPAROSCOPY OPERATIVE;  Surgeon: Linda Hedges, DO;  Location: [facility];  Service:  Gynecology;  Laterality: N/A;    Social History   Socioeconomic History  . Marital status: Married    Spouse name: Not on file  . Number of children: 2  . Years of education: 12+  . Highest education level: Not on file  Occupational History  . Occupation: adm Radiation protection practitioner: [employer]  . Financial resource strain: Not on file  . Food insecurity:    Worry: Not on file    Inability: Not on file  . Transportation needs:    Medical: Not on file    Non-medical: Not on file  Tobacco Use  . Smoking status: Never Smoker  . Smokeless tobacco: Never Used  Substance and Sexual Activity  . Alcohol use: Yes    Comment: rare  . Drug use: No  . Sexual activity: Yes  Lifestyle  . Physical activity:    Days per week: Not on file    Minutes per session: Not on file  . Stress: Not on file  Relationships  . Social connections:    Talks on phone: Not on file    Gets together: Not on file    Attends religious service: Not on file    Active member of club or organization: Not on file    Attends meetings of clubs or organizations: Not on file    Relationship status: Not on file  Other Topics Concern  . Not on file  Social History Narrative   Regular exercise-no   Caffeine Use-yes    Family History  Problem Relation Age of Onset  . Heart disease Mother   . Arthritis Mother   . Hyperlipidemia Mother   . Hypertension Mother   . Heart attack Mother   . Cancer Father        Colon Cancer-Polyps  . Hypertension Father   . Hashimoto's thyroiditis Sister   . Diabetes Sister   . Thyroid disease Sister   . Cancer Paternal Grandfather        Colon Cancer  . Colon cancer Paternal Grandfather   . Hashimoto's thyroiditis Sister   . Thyroid disease Sister     Review of Systems  Constitutional: Negative for chills and fever.  Eyes: Negative for visual disturbance.  Respiratory: Negative for cough, shortness of breath and wheezing.   Cardiovascular:  Negative for chest pain and leg swelling.  Gastrointestinal: Positive for anal bleeding (hemorrhoidal) and constipation (chronic). Negative for abdominal pain (intermittent - constipation), blood in stool, diarrhea and nausea.       GERD controlled with medication  Genitourinary: Negative for dysuria and hematuria.  Musculoskeletal: Positive for back pain.  Skin: Negative for color change and rash.  Neurological: Positive for headaches (migraines). Negative for light-headedness.  Psychiatric/Behavioral: Positive for dysphoric mood.  The patient is nervous/anxious.        Objective:   Vitals:   11/16/18 0906  BP: 124/78  Pulse: 96  Resp: 16  Temp: 98.6 F (37 C)  SpO2: 97%   Filed Weights   11/16/18 0906  Weight: 184 lb (83.5 kg)   Body mass index is 28.82 kg/m.  BP Readings from Last 3 Encounters:  11/16/18 124/78  10/10/18 110/80  09/21/18 128/74    Wt Readings from Last 3 Encounters:  11/16/18 184 lb (83.5 kg)  10/10/18 181 lb (82.1 kg)  09/21/18 182 lb (82.6 kg)     Physical Exam Constitutional: She appears well-developed and well-nourished. No distress.  HENT:  Head: Normocephalic and atraumatic.  Right Ear: External ear normal. Normal ear canal and TM Left Ear: External ear normal.  Normal ear canal and TM Mouth/Throat: Oropharynx is clear and moist.  Eyes: Conjunctivae and EOM are normal.  Neck: Neck supple. No tracheal deviation present. No thyromegaly present.  No carotid bruit  Cardiovascular: Normal rate, regular rhythm and normal heart sounds.   No murmur heard.  No edema. Pulmonary/Chest: Effort normal and breath sounds normal. No respiratory distress. She has no wheezes. She has no rales.  Breast: deferred to Gyn Abdominal: Soft. She exhibits no distension. There is no tenderness.  Lymphadenopathy: She has no cervical adenopathy.  Skin: Skin is warm and dry. She is not diaphoretic.  Psychiatric: She has a normal mood and affect. Her behavior is  normal.        Assessment & Plan:   Physical exam: Screening blood work    ordered Immunizations   up-to-date Mammogram    up-to-date Colonoscopy    up-to-date Gyn       up-to-date Eye exams   Up to date   Exercise   Not regular Weight  encouraged weight loss Skin  No concerns Substance abuse    none  See Problem List for Assessment and Plan of chronic medical problems.    Follow-up in 6 months

## 2018-11-16 ENCOUNTER — Other Ambulatory Visit: Payer: Self-pay

## 2018-11-16 ENCOUNTER — Other Ambulatory Visit (INDEPENDENT_AMBULATORY_CARE_PROVIDER_SITE_OTHER): Payer: Federal, State, Local not specified - PPO

## 2018-11-16 ENCOUNTER — Ambulatory Visit (INDEPENDENT_AMBULATORY_CARE_PROVIDER_SITE_OTHER): Payer: Federal, State, Local not specified - PPO | Admitting: Internal Medicine

## 2018-11-16 ENCOUNTER — Encounter: Payer: Self-pay | Admitting: Internal Medicine

## 2018-11-16 ENCOUNTER — Telehealth: Payer: Self-pay | Admitting: Gastroenterology

## 2018-11-16 VITALS — BP 124/78 | HR 96 | Temp 98.6°F | Resp 16 | Ht 67.0 in | Wt 184.0 lb

## 2018-11-16 DIAGNOSIS — F411 Generalized anxiety disorder: Secondary | ICD-10-CM

## 2018-11-16 DIAGNOSIS — R7303 Prediabetes: Secondary | ICD-10-CM

## 2018-11-16 DIAGNOSIS — L219 Seborrheic dermatitis, unspecified: Secondary | ICD-10-CM

## 2018-11-16 DIAGNOSIS — Z0000 Encounter for general adult medical examination without abnormal findings: Secondary | ICD-10-CM

## 2018-11-16 DIAGNOSIS — E7849 Other hyperlipidemia: Secondary | ICD-10-CM

## 2018-11-16 DIAGNOSIS — F3289 Other specified depressive episodes: Secondary | ICD-10-CM

## 2018-11-16 DIAGNOSIS — K5909 Other constipation: Secondary | ICD-10-CM | POA: Diagnosis not present

## 2018-11-16 DIAGNOSIS — K219 Gastro-esophageal reflux disease without esophagitis: Secondary | ICD-10-CM

## 2018-11-16 DIAGNOSIS — G43001 Migraine without aura, not intractable, with status migrainosus: Secondary | ICD-10-CM

## 2018-11-16 LAB — LIPID PANEL
Cholesterol: 168 mg/dL (ref 0–200)
HDL: 46.2 mg/dL (ref >39.00)
LDL Cholesterol: 93 mg/dL (ref 0–99)
NonHDL: 122.06
Total CHOL/HDL Ratio: 4
Triglycerides: 145 mg/dL (ref 0.0–149.0)
VLDL: 29 mg/dL (ref 0.0–40.0)

## 2018-11-16 LAB — CBC WITH DIFFERENTIAL/PLATELET
Basophils Absolute: 0 10*3/uL (ref 0.0–0.1)
Basophils Relative: 0.7 % (ref 0.0–3.0)
Eosinophils Absolute: 0.1 10*3/uL (ref 0.0–0.7)
Eosinophils Relative: 1.5 % (ref 0.0–5.0)
HCT: 41.7 % (ref 36.0–46.0)
Hemoglobin: 14.4 g/dL (ref 12.0–15.0)
Lymphocytes Relative: 15.8 % (ref 12.0–46.0)
Lymphs Abs: 1.1 10*3/uL (ref 0.7–4.0)
MCHC: 34.6 g/dL (ref 30.0–36.0)
MCV: 85.7 fl (ref 78.0–100.0)
Monocytes Absolute: 0.5 10*3/uL (ref 0.1–1.0)
Monocytes Relative: 7 % (ref 3.0–12.0)
Neutro Abs: 5.1 10*3/uL (ref 1.4–7.7)
Neutrophils Relative %: 75 % (ref 43.0–77.0)
Platelets: 254 10*3/uL (ref 150.0–400.0)
RBC: 4.87 Mil/uL (ref 3.87–5.11)
RDW: 13.1 % (ref 11.5–15.5)
WBC: 6.8 10*3/uL (ref 4.0–10.5)

## 2018-11-16 LAB — HEMOGLOBIN A1C: Hgb A1c MFr Bld: 5.8 % (ref 4.6–6.5)

## 2018-11-16 LAB — COMPREHENSIVE METABOLIC PANEL
ALT: 28 U/L (ref 0–35)
AST: 25 U/L (ref 0–37)
Albumin: 4.5 g/dL (ref 3.5–5.2)
Alkaline Phosphatase: 50 U/L (ref 39–117)
BUN: 12 mg/dL (ref 6–23)
CO2: 28 mEq/L (ref 19–32)
Calcium: 9.6 mg/dL (ref 8.4–10.5)
Chloride: 103 mEq/L (ref 96–112)
Creatinine, Ser: 0.97 mg/dL (ref 0.40–1.20)
GFR: 60.99 mL/min (ref >60.00)
Glucose, Bld: 112 mg/dL — ABNORMAL HIGH (ref 70–99)
Potassium: 4.2 mEq/L (ref 3.5–5.1)
Sodium: 139 mEq/L (ref 135–145)
Total Bilirubin: 0.6 mg/dL (ref 0.2–1.2)
Total Protein: 7.1 g/dL (ref 6.0–8.3)

## 2018-11-16 LAB — TSH: TSH: 1.38 u[IU]/mL (ref 0.35–4.50)

## 2018-11-16 MED ORDER — KETOCONAZOLE 2 % EX SHAM: 1.0000 "application " | MEDICATED_SHAMPOO | CUTANEOUS | 0 refills | Status: DC

## 2018-11-16 MED ORDER — ZOLMITRIPTAN 5 MG NA SOLN: 1.0000 | NASAL | 3 refills | Status: DC | PRN

## 2018-11-16 MED ORDER — ZOLMITRIPTAN 5 MG PO TABS: 5.0000 mg | ORAL_TABLET | ORAL | 0 refills | Status: DC | PRN

## 2018-11-16 NOTE — Assessment & Plan Note (Signed)
Management per psychiatry 

## 2018-11-16 NOTE — Telephone Encounter (Signed)
Pt needs a "doctor's note" letter listing all of her appts with Dr. Silverio Decamp since August, 2019 for her boss.  Pt requested call when done to come pick up letter.

## 2018-11-16 NOTE — Assessment & Plan Note (Addendum)
Following with dr Silverio Decamp Taking amitiza twice daily, but it is expensive-it is very effective and she will try to continue

## 2018-11-16 NOTE — Assessment & Plan Note (Signed)
Check lipid panel  Continue daily statin Regular exercise and healthy diet encouraged  

## 2018-11-16 NOTE — Telephone Encounter (Signed)
Spoke with the patient. Letter drafted per her stated needs. She will pick up the letter from the office at the front desk.

## 2018-11-16 NOTE — Assessment & Plan Note (Signed)
Controlled with protonix and ranitidine - but needs them both Continue both medications

## 2018-11-16 NOTE — Assessment & Plan Note (Signed)
Gets migraines 1-2 times a month imitrex nasal spray not as effective as zomig nasal spray which is very expensive Will try zomig pills

## 2018-11-16 NOTE — Assessment & Plan Note (Signed)
Check a1c Low sugar / carb diet Stressed regular exercise   

## 2018-11-16 NOTE — Assessment & Plan Note (Signed)
Using ketoconazole shampoo-we will refill

## 2018-11-17 ENCOUNTER — Encounter: Payer: Self-pay | Admitting: Internal Medicine

## 2018-11-23 ENCOUNTER — Encounter: Payer: Self-pay | Admitting: Internal Medicine

## 2018-11-29 DIAGNOSIS — M533 Sacrococcygeal disorders, not elsewhere classified: Secondary | ICD-10-CM | POA: Diagnosis not present

## 2018-12-28 DIAGNOSIS — Z6828 Body mass index (BMI) 28.0-28.9, adult: Secondary | ICD-10-CM | POA: Diagnosis not present

## 2018-12-28 DIAGNOSIS — M533 Sacrococcygeal disorders, not elsewhere classified: Secondary | ICD-10-CM | POA: Diagnosis not present

## 2018-12-28 DIAGNOSIS — M461 Sacroiliitis, not elsewhere classified: Secondary | ICD-10-CM | POA: Diagnosis not present

## 2019-01-11 ENCOUNTER — Encounter: Payer: Self-pay | Admitting: *Deleted

## 2019-01-24 DIAGNOSIS — M461 Sacroiliitis, not elsewhere classified: Secondary | ICD-10-CM | POA: Diagnosis not present

## 2019-02-22 ENCOUNTER — Ambulatory Visit: Payer: Self-pay | Admitting: Gastroenterology

## 2019-02-28 DIAGNOSIS — J383 Other diseases of vocal cords: Secondary | ICD-10-CM | POA: Diagnosis not present

## 2019-02-28 DIAGNOSIS — L509 Urticaria, unspecified: Secondary | ICD-10-CM | POA: Diagnosis not present

## 2019-02-28 DIAGNOSIS — K219 Gastro-esophageal reflux disease without esophagitis: Secondary | ICD-10-CM | POA: Diagnosis not present

## 2019-02-28 DIAGNOSIS — J3089 Other allergic rhinitis: Secondary | ICD-10-CM | POA: Diagnosis not present

## 2019-03-05 ENCOUNTER — Encounter: Payer: Self-pay | Admitting: General Surgery

## 2019-03-06 ENCOUNTER — Other Ambulatory Visit: Payer: Self-pay

## 2019-03-06 ENCOUNTER — Ambulatory Visit (INDEPENDENT_AMBULATORY_CARE_PROVIDER_SITE_OTHER): Payer: Federal, State, Local not specified - PPO | Admitting: Gastroenterology

## 2019-03-06 ENCOUNTER — Encounter: Payer: Self-pay | Admitting: Gastroenterology

## 2019-03-06 VITALS — Ht 67.0 in | Wt 180.0 lb

## 2019-03-06 DIAGNOSIS — K642 Third degree hemorrhoids: Secondary | ICD-10-CM

## 2019-03-06 DIAGNOSIS — K582 Mixed irritable bowel syndrome: Secondary | ICD-10-CM | POA: Diagnosis not present

## 2019-03-06 DIAGNOSIS — J312 Chronic pharyngitis: Secondary | ICD-10-CM

## 2019-03-06 DIAGNOSIS — K2100 Gastro-esophageal reflux disease with esophagitis, without bleeding: Secondary | ICD-10-CM

## 2019-03-06 DIAGNOSIS — K5904 Chronic idiopathic constipation: Secondary | ICD-10-CM | POA: Diagnosis not present

## 2019-03-06 MED ORDER — DEXLANSOPRAZOLE 30 MG PO CPDR: 30.0000 mg | DELAYED_RELEASE_CAPSULE | Freq: Every day | ORAL | 3 refills | Status: DC

## 2019-03-06 NOTE — Progress Notes (Signed)
Pamela Novak    [id number]    03/26/1971  Primary Care Physician:Burns, Claudina Lick, MD  Referring Physician: [name], MD [place], [place] [address]  This service was provided via audio and video telemedicine (Doximity) due to [place] 19 pandemic.  Patient location: Home Provider location: Office Used 2 patient identifiers to confirm the correct person. Explained the limitations in evaluation and management via telemedicine. Patient is aware of potential medical charges for this visit.  Patient consented to this virtual visit.  The persons participating in this telemedicine service were myself and the patient   Chief complaint: GERD, constipation  HPI:  48 year old female with history of chronic GERD, chronic constipation, symptomatic hemorrhoids status post band ligation She continues to have persistent heartburn despite taking Protonix twice daily, she has heartburn almost every day, worse when she eats acidic foods or heavy meals.  She tries to avoid foods that trigger her symptoms. She is taking Amitiza intermittently because if she takes it twice a day she is having diarrhea.  She continues to have irregular bowel habits with alternating constipation and diarrhea.  She had an episode of hemorrhoid protrusion, self resolved.  Also has intermittent bright red blood per rectum.  EGD May 10, 2017 LA grade D reflux esophagitis, gastritis and gastric nodule biopsied negative for dysplasia, intestinal metaplasia or malignancy.  H. pylori negative. Colonoscopy July 04, 2018: 2 sessile serrated adenomatous polyps removed, diverticulosis and hemorrhoids  Outpatient Encounter Medications as of 03/06/2019  Medication Sig  . ALPRAZolam (XANAX) 0.25 MG tablet Take 0.25 mg by mouth 2 (two) times daily as needed for anxiety.  [name] Kitchen atorvastatin (LIPITOR) 20 MG tablet Take 1 tablet (20 mg total) by mouth daily.  [name] Kitchen buPROPion (WELLBUTRIN XL) 150 MG 24 hr tablet Take  150 mg by mouth daily.  . cyclobenzaprine (FLEXERIL) 5 MG tablet TAKE 1 TABLET BY MOUTH THREE TIMES A DAY AS NEEDED FOR MUSCLE SPASMS  . DULoxetine HCl 60 MG CSDR Take 120 mg by mouth 2 (two) times a day.   [name] Kitchen EPINEPHrine (AUVI-Q) 0.3 mg/0.3 mL IJ SOAJ injection Inject 0.3 mg into the muscle as needed.  . gabapentin (NEURONTIN) 100 MG capsule TAKE 1 CAPSULE BY MOUTH AT BEDTIME. INCREASE UP TO 300 MG AT NIGHT IF NEEDED  . hydrocortisone (ANUSOL-HC) 2.5 % rectal cream Place 1 application rectally 2 (two) times daily.  [name] Kitchen ketoconazole (NIZORAL) 2 % shampoo Apply 1 application topically 2 (two) times a week.  . lubiprostone (AMITIZA) 8 MCG capsule TAKE 1 CAPSULE (8 MCG TOTAL) BY MOUTH 2 (TWO) TIMES DAILY WITH A MEAL.  . pantoprazole (PROTONIX) 40 MG tablet Take 1 tablet (40 mg total) by mouth daily.  . ranitidine (ZANTAC) 150 MG tablet Take 1 tablet (150 mg total) by mouth at bedtime. -- Office visit needed for further  . SUMAtriptan (IMITREX) 20 MG/ACT nasal spray Place 1 spray (20 mg total) into the nose every 2 (two) hours as needed for migraine. May repeat in 2 hrs if headache persists or recurs.  . traMADol (ULTRAM) 50 MG tablet TAKE 1 TABLET (50 MG TOTAL) BY MOUTH EVERY 8 (EIGHT) HOURS AS NEEDED.  [name] Kitchen zolmitriptan (ZOMIG) 5 MG nasal solution Place 1 spray into the nose as needed for migraine.  . zolmitriptan (ZOMIG) 5 MG tablet Take 1 tablet (5 mg total) by mouth as needed for migraine.   No facility-administered encounter medications on file as of 03/06/2019.  Allergies as of 03/06/2019 - Review Complete 03/05/2019  Allergen Reaction Noted  . Capsaicin Cough 01/18/2016  . Other Other (See Comments) 09/19/2018  . Flagyl [metronidazole] Rash 09/11/2012    Past Medical History:  Diagnosis Date  . Allergy   . Anxiety   . Arthritis   . Blood in stool   . Breast hematoma   . Complication of anesthesia    "woke up during colonoscopy"  . Depression   . GERD (gastroesophageal reflux  disease)   . Hyperlipidemia   . Traumatic hematoma of knee     Past Surgical History:  Procedure Laterality Date  . ABLATION ON ENDOMETRIOSIS  10/04/2012   Procedure: ABLATION ON ENDOMETRIOSIS;  Surgeon: Linda Hedges, DO;  Location: [facility];  Service: Gynecology;  Laterality: N/A;  . COLONOSCOPY    . CONDYLOMA EXCISION/FULGURATION  10/04/2012   Procedure: CONDYLOMA REMOVAL;  Surgeon: Linda Hedges, DO;  Location: [facility];  Service: Gynecology;  Laterality: N/A;  . DILITATION & CURRETTAGE/HYSTROSCOPY WITH NOVASURE ABLATION  10/04/2012   Procedure: DILATATION & CURETTAGE/HYSTEROSCOPY WITH NOVASURE ABLATION;  Surgeon: Linda Hedges, DO;  Location: [facility];  Service: Gynecology;  Laterality: N/A;  . LAPAROSCOPY  10/04/2012   Procedure: LAPAROSCOPY OPERATIVE;  Surgeon: Linda Hedges, DO;  Location: [facility];  Service: Gynecology;  Laterality: N/A;    Family History  Problem Relation Age of Onset  . Heart disease Mother   . Arthritis Mother   . Hyperlipidemia Mother   . Hypertension Mother   . Heart attack Mother   . Cancer Father        Colon Cancer-Polyps  . Hypertension Father   . Hashimoto's thyroiditis Sister   . Diabetes Sister   . Thyroid disease Sister   . Cancer Paternal Grandfather        Colon Cancer  . Colon cancer Paternal Grandfather   . Hashimoto's thyroiditis Sister   . Thyroid disease Sister     Social History   Socioeconomic History  . Marital status: Married    Spouse name: [name]  . Number of children: 2  . Years of education: 12+  . Highest education level: Not on file  Occupational History  . Occupation: adm Radiation protection practitioner: [employer]  . Financial resource strain: Not on file  . Food insecurity:    Worry: Not on file    Inability: Not on file  . Transportation needs:    Medical: Not on file    Non-medical: Not on file  Tobacco Use  . Smoking status: Never Smoker  . Smokeless tobacco: Never Used  Substance and Sexual Activity   . Alcohol use: Yes    Comment: rare  . Drug use: No  . Sexual activity: Yes  Lifestyle  . Physical activity:    Days per week: Not on file    Minutes per session: Not on file  . Stress: Not on file  Relationships  . Social connections:    Talks on phone: Not on file    Gets together: Not on file    Attends religious service: Not on file    Active member of club or organization: Not on file    Attends meetings of clubs or organizations: Not on file    Relationship status: Not on file  . Intimate partner violence:    Fear of current or ex partner: Not on file    Emotionally abused: Not on file    Physically abused: Not on  file    Forced sexual activity: Not on file  Other Topics Concern  . Not on file  Social History Narrative   Regular exercise-no   Caffeine Use-yes      Review of systems: Review of Systems as per HPI All other systems reviewed and are negative.   Physical Exam: Vitals were not taken and physical exam was not performed during this virtual visit.  Data Reviewed:  Reviewed labs, radiology imaging, old records and pertinent past GI work up   Assessment and Plan/Recommendations:  48 year old female with chronic GERD, persistent breakthrough heartburn and sore throat on Protonix daily and Zantac at bedtime  Stop Protonix and Zantac  Start Dexilant 30 mg daily  Antireflux measures  Plan for esophageal manometry and 24-hour pH impedance study once able to schedule and restrictions are lifted on elective procedures.  Elective procedures are currently on hold due to COVID-19 epidemic  Alternating constipation and diarrhea: Stop Amitiza Start MiraLAX half capful daily and titrate based on response to have 1-2 soft bowel movements daily Benefiber 1 teaspoon 2-3 times daily Increase fluid intake to 8 to 10 cups of water daily  Symptomatic hemorrhoids status post band ligation, will has protruding symptomatic hemorrhoids intermittently Anusol  suppository [hydrocortisone 25 mg] daily at bedtime as needed  Follow-up in 2 to 3 months   K. Denzil Magnuson , MD   CC: [name], MD

## 2019-03-06 NOTE — Patient Instructions (Addendum)
Stop Protonix and Zantac  Start Dexilant 30 mg daily (prescription sent to your pharmacy)  Antireflux measures  Plan for esophageal manometry and 24-hour pH impedance study once able to schedule and restrictions are lifted on elective procedures.  Elective procedures are currently on hold due to COVID-19 epidemic We will contact you when that appointment becomes available   Alternating constipation and diarrhea:  Stop Amitiza  Start MiraLAX half capful daily and titrate based on response to have 1-2 soft bowel movements daily  Benefiber 1 teaspoon 2-3 times daily  Increase fluid intake to 8 to 10 cups of water daily  Symptomatic hemorrhoids status post band ligation, will has protruding symptomatic hemorrhoids intermittently   Anusol suppository [hydrocortisone 25 mg] daily at bedtime as needed  Follow-up in 2 to 3 months   Gastroesophageal Reflux Disease, Adult Gastroesophageal reflux (GER) happens when acid from the stomach flows up into the tube that connects the mouth and the stomach (esophagus). Normally, food travels down the esophagus and stays in the stomach to be digested. However, when a person has GER, food and stomach acid sometimes move back up into the esophagus. If this becomes a more serious problem, the person may be diagnosed with a disease called gastroesophageal reflux disease (GERD). GERD occurs when the reflux:  Happens often.  Causes frequent or severe symptoms.  Causes problems such as damage to the esophagus. When stomach acid comes in contact with the esophagus, the acid may cause soreness (inflammation) in the esophagus. Over time, GERD may create small holes (ulcers) in the lining of the esophagus. What are the causes? This condition is caused by a problem with the muscle between the esophagus and the stomach (lower esophageal sphincter, or LES). Normally, the LES muscle closes after food passes through the esophagus to the stomach. When the LES is  weakened or abnormal, it does not close properly, and that allows food and stomach acid to go back up into the esophagus. The LES can be weakened by certain dietary substances, medicines, and medical conditions, including:  Tobacco use.  Pregnancy.  Having a hiatal hernia.  Alcohol use.  Certain foods and beverages, such as coffee, chocolate, onions, and peppermint. What increases the risk? You are more likely to develop this condition if you:  Have an increased body weight.  Have a connective tissue disorder.  Use NSAID medicines. What are the signs or symptoms? Symptoms of this condition include:  Heartburn.  Difficult or painful swallowing.  The feeling of having a lump in the throat.  Abitter taste in the mouth.  Bad breath.  Having a large amount of saliva.  Having an upset or bloated stomach.  Belching.  Chest pain. Different conditions can cause chest pain. Make sure you see your health care provider if you experience chest pain.  Shortness of breath or wheezing.  Ongoing (chronic) cough or a night-time cough.  Wearing away of tooth enamel.  Weight loss. How is this diagnosed? Your health care provider will take a medical history and perform a physical exam. To determine if you have mild or severe GERD, your health care provider may also monitor how you respond to treatment. You may also have tests, including:  A test to examine your stomach and esophagus with a small camera (endoscopy).  A test thatmeasures the acidity level in your esophagus.  A test thatmeasures how much pressure is on your esophagus.  A barium swallow or modified barium swallow test to show the shape, size, and functioning  of your esophagus. How is this treated? The goal of treatment is to help relieve your symptoms and to prevent complications. Treatment for this condition may vary depending on how severe your symptoms are. Your health care provider may recommend:  Changes  to your diet.  Medicine.  Surgery. Follow these instructions at home: Eating and drinking   Follow a diet as recommended by your health care provider. This may involve avoiding foods and drinks such as: ? Coffee and tea (with or without caffeine). ? Drinks that containalcohol. ? Energy drinks and sports drinks. ? Carbonated drinks or sodas. ? Chocolate and cocoa. ? Peppermint and mint flavorings. ? Garlic and onions. ? Horseradish. ? Spicy and acidic foods, including peppers, chili powder, curry powder, vinegar, hot sauces, and barbecue sauce. ? Citrus fruit juices and citrus fruits, such as oranges, lemons, and limes. ? Tomato-based foods, such as red sauce, chili, salsa, and pizza with red sauce. ? Fried and fatty foods, such as donuts, french fries, potato chips, and high-fat dressings. ? High-fat meats, such as hot dogs and fatty cuts of red and white meats, such as rib eye steak, sausage, ham, and bacon. ? High-fat dairy items, such as whole milk, butter, and cream cheese.  Eat small, frequent meals instead of large meals.  Avoid drinking large amounts of liquid with your meals.  Avoid eating meals during the 2-3 hours before bedtime.  Avoid lying down right after you eat.  Do not exercise right after you eat. Lifestyle   Do not use any products that contain nicotine or tobacco, such as cigarettes, e-cigarettes, and chewing tobacco. If you need help quitting, ask your health care provider.  Try to reduce your stress by using methods such as yoga or meditation. If you need help reducing stress, ask your health care provider.  If you are overweight, reduce your weight to an amount that is healthy for you. Ask your health care provider for guidance about a safe weight loss goal. General instructions  Pay attention to any changes in your symptoms.  Take over-the-counter and prescription medicines only as told by your health care provider. Do not take aspirin,  ibuprofen, or other NSAIDs unless your health care provider told you to do so.  Wear loose-fitting clothing. Do not wear anything tight around your waist that causes pressure on your abdomen.  Raise (elevate) the head of your bed about 6 inches (15 cm).  Avoid bending over if this makes your symptoms worse.  Keep all follow-up visits as told by your health care provider. This is important. Contact a health care provider if:  You have: ? New symptoms. ? Unexplained weight loss. ? Difficulty swallowing or it hurts to swallow. ? Wheezing or a persistent cough. ? A hoarse voice.  Your symptoms do not improve with treatment. Get help right away if you:  Have pain in your arms, neck, jaw, teeth, or back.  Feel sweaty, dizzy, or light-headed.  Have chest pain or shortness of breath.  Vomit and your vomit looks like blood or coffee grounds.  Faint.  Have stool that is bloody or black.  Cannot swallow, drink, or eat. Summary  Gastroesophageal reflux happens when acid from the stomach flows up into the esophagus. GERD is a disease in which the reflux happens often, causes frequent or severe symptoms, or causes problems such as damage to the esophagus.  Treatment for this condition may vary depending on how severe your symptoms are. Your health care provider may recommend  diet and lifestyle changes, medicine, or surgery.  Contact a health care provider if you have new or worsening symptoms.  Take over-the-counter and prescription medicines only as told by your health care provider. Do not take aspirin, ibuprofen, or other NSAIDs unless your health care provider told you to do so.  Keep all follow-up visits as told by your health care provider. This is important. This information is not intended to replace advice given to you by your health care provider. Make sure you discuss any questions you have with your health care provider. Document Released: 07/27/2005 Document Revised:  04/25/2018 Document Reviewed: 04/25/2018 Elsevier Interactive Patient Education  2019 [employer].

## 2019-03-07 ENCOUNTER — Telehealth: Payer: Self-pay | Admitting: Gastroenterology

## 2019-03-07 ENCOUNTER — Other Ambulatory Visit: Payer: Self-pay

## 2019-03-07 NOTE — Telephone Encounter (Signed)
Called patients insurance at [phone] to do a preform   That was not the correct number for pts insurance which was given to me by her pharmacy  L/M for pt to return call with RX information from insurance card

## 2019-03-07 NOTE — Telephone Encounter (Signed)
Patient called and said that the pharmacy told her that the med [place]  is not covered with her ins. Would like to know if their is going to be a Pre Authorization

## 2019-03-07 NOTE — Telephone Encounter (Signed)
Prior auth for Dexilant done today through Cover My Meds  Waiting on a response

## 2019-03-07 NOTE — Telephone Encounter (Signed)
[phone]    rx group number [id number]    Member ID  [id number]

## 2019-03-08 ENCOUNTER — Telehealth: Payer: Self-pay

## 2019-03-08 ENCOUNTER — Other Ambulatory Visit: Payer: Self-pay

## 2019-03-08 DIAGNOSIS — M461 Sacroiliitis, not elsewhere classified: Secondary | ICD-10-CM | POA: Diagnosis not present

## 2019-03-08 MED ORDER — POLYETHYLENE GLYCOL 3350 17 GM/SCOOP PO POWD: ORAL | 1 refills | Status: DC

## 2019-03-08 MED ORDER — HYDROCORTISONE ACETATE 25 MG RE SUPP: 25.0000 mg | Freq: Every day | RECTAL | 1 refills | Status: DC

## 2019-03-08 NOTE — Telephone Encounter (Signed)
Pt called states that she was told 2 prescriptions would be sent in for her after her phone visit with Dr. Silverio Decamp , but pharmacy did not have anything. Rx for Anusol Supp and Miralax sent to [facility] at patients request.

## 2019-03-11 ENCOUNTER — Telehealth: Payer: Self-pay | Admitting: *Deleted

## 2019-03-11 NOTE — Telephone Encounter (Signed)
Error  We did approval for Dexilant 30 mg Not sure who done approval for Pantoprazole patient is suppose to STOP pantoprazole

## 2019-03-11 NOTE — Telephone Encounter (Signed)
Pantoprazole approval 02/05/2019-03/06/2020  Sent approval letter to be scanned in

## 2019-03-11 NOTE — Telephone Encounter (Signed)
Called insurance and had to get a Formulary Exception for Dexilant 30mg  and it was approved until 03/10/2020

## 2019-03-11 NOTE — Telephone Encounter (Signed)
Dexilant approved until 03/10/2020

## 2019-03-26 ENCOUNTER — Telehealth: Payer: Self-pay

## 2019-03-28 ENCOUNTER — Other Ambulatory Visit (HOSPITAL_COMMUNITY)
Admission: RE | Admit: 2019-03-28 | Discharge: 2019-03-28 | Disposition: A | Discharge status: Home / Self Care | Payer: Federal, State, Local not specified - PPO | Source: Ambulatory Visit | Attending: Gastroenterology | Admitting: Gastroenterology

## 2019-03-28 DIAGNOSIS — Z1159 Encounter for screening for other viral diseases: Secondary | ICD-10-CM | POA: Insufficient documentation

## 2019-03-29 LAB — NOVEL CORONAVIRUS, NAA (HOSP ORDER, SEND-OUT TO REF LAB; TAT 18-24 HRS): SARS-CoV-2, NAA: NOT DETECTED

## 2019-04-01 ENCOUNTER — Encounter (HOSPITAL_COMMUNITY)
Admission: RE | Disposition: A | Discharge status: Home / Self Care | Payer: Self-pay | Source: Home / Self Care | Attending: Gastroenterology

## 2019-04-01 ENCOUNTER — Ambulatory Visit (HOSPITAL_COMMUNITY)
Admission: RE | Admit: 2019-04-01 | Discharge: 2019-04-01 | Disposition: A | Discharge status: Home / Self Care | Payer: Federal, State, Local not specified - PPO | Attending: Gastroenterology | Admitting: Gastroenterology

## 2019-04-01 DIAGNOSIS — K228 Other specified diseases of esophagus: Secondary | ICD-10-CM | POA: Diagnosis not present

## 2019-04-01 DIAGNOSIS — R12 Heartburn: Secondary | ICD-10-CM | POA: Diagnosis not present

## 2019-04-01 DIAGNOSIS — K219 Gastro-esophageal reflux disease without esophagitis: Secondary | ICD-10-CM | POA: Diagnosis not present

## 2019-04-01 HISTORY — PX: ESOPHAGEAL MANOMETRY: SHX5429

## 2019-04-01 HISTORY — PX: 24 HOUR PH STUDY: SHX5419

## 2019-04-01 HISTORY — PX: UPPER GI ENDOSCOPY: SHX6162

## 2019-04-01 SURGERY — MANOMETRY, ESOPHAGUS
Anesthesia: Choice

## 2019-04-01 MED ORDER — LIDOCAINE VISCOUS HCL 2 % MT SOLN
OROMUCOSAL | Status: AC
  Filled 2019-04-01: qty 15

## 2019-04-01 SURGICAL SUPPLY — 2 items
FACESHIELD LNG OPTICON STERILE (SAFETY) IMPLANT
GLOVE BIO SURGEON STRL SZ8 (GLOVE) ×4 IMPLANT

## 2019-04-01 NOTE — Progress Notes (Signed)
Esophageal Manometry done per protocol without complication. Pt tolerated well. Small amt of blood on probe when removed but no further bleeding after probe removed.  Ph probe inserted per protocol at 34.5cm in left nare . Pt tolerated well without complication. Pt instructed regarding Ph study and monitor. Pt verbalized understanding. Questions answered.Pt to return to endo unit tomorrow at or after 1345 to have probe removed and monitor downloaded.

## 2019-04-02 DIAGNOSIS — F411 Generalized anxiety disorder: Secondary | ICD-10-CM | POA: Diagnosis not present

## 2019-04-02 DIAGNOSIS — F4310 Post-traumatic stress disorder, unspecified: Secondary | ICD-10-CM | POA: Diagnosis not present

## 2019-04-03 ENCOUNTER — Encounter (HOSPITAL_COMMUNITY): Payer: Self-pay | Admitting: Gastroenterology

## 2019-04-04 ENCOUNTER — Other Ambulatory Visit: Payer: Self-pay

## 2019-04-04 ENCOUNTER — Other Ambulatory Visit: Payer: Federal, State, Local not specified - PPO

## 2019-04-04 DIAGNOSIS — R197 Diarrhea, unspecified: Secondary | ICD-10-CM

## 2019-04-05 ENCOUNTER — Telehealth: Payer: Self-pay | Admitting: *Deleted

## 2019-04-05 ENCOUNTER — Other Ambulatory Visit: Payer: Federal, State, Local not specified - PPO

## 2019-04-05 DIAGNOSIS — R197 Diarrhea, unspecified: Secondary | ICD-10-CM

## 2019-04-05 NOTE — Telephone Encounter (Signed)
Patient came into the office today wanting a work note from Dr Silverio Decamp from the time she  had her Virgel Gess study done until her labs come back - Patient had labs and stool studies today   [place] per Dr Silverio Decamp to give patient a note for work   Wrote work note from  04/01/2019 and 04/10/2019 until after her lab results come back

## 2019-04-09 ENCOUNTER — Other Ambulatory Visit: Payer: Self-pay | Admitting: Gastroenterology

## 2019-04-09 ENCOUNTER — Other Ambulatory Visit: Payer: Self-pay

## 2019-04-09 DIAGNOSIS — K219 Gastro-esophageal reflux disease without esophagitis: Secondary | ICD-10-CM

## 2019-04-09 MED ORDER — NORTRIPTYLINE HCL 10 MG PO CAPS: 10.0000 mg | ORAL_CAPSULE | Freq: Every day | ORAL | 3 refills | Status: DC

## 2019-04-09 NOTE — Progress Notes (Signed)
Called patient to discuss results of esophageal manometry and 24-hour pH study, unable to reach left a message.  Showed good acid suppression on PPI, hypersensitive esophagus  Continue PPI and antireflux measures  Start nortriptyline 10 mg daily at bedtime  Await results of stool study  Please inform patient the results. Thanks

## 2019-04-10 LAB — GASTROINTESTINAL PATHOGEN PANEL PCR
C. difficile Tox A/B, PCR: NOT DETECTED
Campylobacter, PCR: NOT DETECTED
Cryptosporidium, PCR: NOT DETECTED
E coli (ETEC) LT/ST PCR: NOT DETECTED
E coli (STEC) stx1/stx2, PCR: NOT DETECTED
E coli 0157, PCR: NOT DETECTED
Giardia lamblia, PCR: NOT DETECTED
Norovirus, PCR: NOT DETECTED
Rotavirus A, PCR: NOT DETECTED
Salmonella, PCR: NOT DETECTED
Shigella, PCR: NOT DETECTED

## 2019-04-15 ENCOUNTER — Telehealth: Payer: Self-pay | Admitting: Gastroenterology

## 2019-04-15 NOTE — Telephone Encounter (Signed)
ok 

## 2019-04-15 NOTE — Telephone Encounter (Signed)
[facility] called to report that they gave pt Dexilant 60mg  instead of 30mg  by mistake. Pt took few doses of wrong strength. [facility].

## 2019-04-29 DIAGNOSIS — K641 Second degree hemorrhoids: Secondary | ICD-10-CM | POA: Diagnosis not present

## 2019-05-13 ENCOUNTER — Other Ambulatory Visit: Payer: Self-pay | Admitting: Internal Medicine

## 2019-05-16 NOTE — Progress Notes (Signed)
Subjective:    Patient ID: Pamela Novak, female    DOB: 09-16-1971, 48 y.o.   MRN: [id number]  HPI The patient is here for follow up.  She is exercising regularly - recumbent bike.     She has had increased stress and has been stress eating.    Prediabetes:  She has not been compliant with a low sugar/carbohydrate diet.  She is exercising regularly.  Hyperlipidemia: She is taking her medication daily. She is compliant with a low fat/cholesterol diet. She denies myalgias.   Migraines:  She takes zomig prn.  She gets at least 2/month.  The zomig works pretty well but not as good as the zomig spray.  She never tried the Imitrex spray.  GERD:  She is taking her medication daily as prescribed.  She denies any GERD, but has other gastrointestinal symptoms.  She is following with GI     Medications and allergies reviewed with patient and updated if appropriate.  Patient Active Problem List   Diagnosis Date Noted  . Chronic constipation 11/16/2018  . Neck pain 09/21/2018  . Bite, snake 08/27/2018  . Open bite of right thumb without damage to nail 08/27/2018  . Migraine without aura and with status migrainosus, not intractable 04/12/2018  . Bilateral low back pain 03/21/2018  . Lumbar disc herniation 03/21/2018  . Prediabetes 03/23/2017  . Seborrheic dermatitis of scalp 03/23/2017  . Hyperlipidemia 01/21/2016  . Depression 01/18/2016  . PTSD (post-traumatic stress disorder) 01/18/2016  . GERD (gastroesophageal reflux disease) 01/18/2016  . Varicose veins 05/21/2013  . GAD (generalized anxiety disorder) 11/02/2012    Current Outpatient Medications on File Prior to Visit  Medication Sig Dispense Refill  . ALPRAZolam (XANAX) 0.25 MG tablet Take 0.25 mg by mouth 2 (two) times daily as needed for anxiety.    [name] Kitchen atorvastatin (LIPITOR) 20 MG tablet Take 1 tablet (20 mg total) by mouth daily. 90 tablet 3  . buPROPion (WELLBUTRIN XL) 150 MG 24 hr tablet Take 150 mg by mouth daily.     . cyclobenzaprine (FLEXERIL) 5 MG tablet TAKE 1 TABLET BY MOUTH THREE TIMES A DAY AS NEEDED FOR MUSCLE SPASMS 45 tablet 0  . Dexlansoprazole (DEXILANT) 30 MG capsule Take 1 capsule (30 mg total) by mouth daily. 30 capsule 3  . DULoxetine HCl 60 MG CSDR Take 120 mg by mouth 2 (two) times a day.     [name] Kitchen EPINEPHrine (AUVI-Q) 0.3 mg/0.3 mL IJ SOAJ injection Inject 0.3 mg into the muscle as needed.    . gabapentin (NEURONTIN) 100 MG capsule TAKE 1 CAPSULE BY MOUTH AT BEDTIME. INCREASE UP TO 300 MG AT NIGHT IF NEEDED 90 capsule 0  . hydrocortisone (ANUSOL-HC) 2.5 % rectal cream Place 1 application rectally 2 (two) times daily. 30 g 1  . hydrocortisone (ANUSOL-HC) 25 MG suppository Place 1 suppository (25 mg total) rectally at bedtime. 12 suppository 1  . ketoconazole (NIZORAL) 2 % shampoo APPLY 1 APPLICATION TOPICALLY 2 (TWO) TIMES A WEEK. 120 mL 0  . nortriptyline (PAMELOR) 10 MG capsule Take 1 capsule (10 mg total) by mouth at bedtime. 30 capsule 3  . polyethylene glycol powder (GLYCOLAX/MIRALAX) 17 GM/SCOOP powder Take half capful daily and titrate based on response to have 1-2 soft bowel movements daily. 255 g 1  . SUMAtriptan (IMITREX) 20 MG/ACT nasal spray Place 1 spray (20 mg total) into the nose every 2 (two) hours as needed for migraine. May repeat in 2 hrs if headache persists or  recurs. 1 Inhaler 5  . traMADol (ULTRAM) 50 MG tablet TAKE 1 TABLET (50 MG TOTAL) BY MOUTH EVERY 8 (EIGHT) HOURS AS NEEDED. 21 tablet 1  . zolmitriptan (ZOMIG) 5 MG tablet Take 1 tablet (5 mg total) by mouth as needed for migraine. 10 tablet 0   No current facility-administered medications on file prior to visit.     Past Medical History:  Diagnosis Date  . Allergy   . Anxiety   . Arthritis   . Blood in stool   . Breast hematoma   . Complication of anesthesia    "woke up during colonoscopy"  . Depression   . GERD (gastroesophageal reflux disease)   . Hyperlipidemia   . Traumatic hematoma of knee     Past  Surgical History:  Procedure Laterality Date  . [place] STUDY N/A 04/01/2019   Procedure: [place] STUDY;  Surgeon: Mauri Pole, MD;  Location: [facility];  Service: Endoscopy;  Laterality: N/A;  . ABLATION ON ENDOMETRIOSIS  10/04/2012   Procedure: ABLATION ON ENDOMETRIOSIS;  Surgeon: Linda Hedges, DO;  Location: [facility];  Service: Gynecology;  Laterality: N/A;  . COLONOSCOPY    . CONDYLOMA EXCISION/FULGURATION  10/04/2012   Procedure: CONDYLOMA REMOVAL;  Surgeon: Linda Hedges, DO;  Location: [facility];  Service: Gynecology;  Laterality: N/A;  . DILITATION & CURRETTAGE/HYSTROSCOPY WITH NOVASURE ABLATION  10/04/2012   Procedure: DILATATION & CURETTAGE/HYSTEROSCOPY WITH NOVASURE ABLATION;  Surgeon: Linda Hedges, DO;  Location: [facility];  Service: Gynecology;  Laterality: N/A;  . ESOPHAGEAL MANOMETRY N/A 04/01/2019   Procedure: ESOPHAGEAL MANOMETRY (EM);  Surgeon: Mauri Pole, MD;  Location: [facility];  Service: Endoscopy;  Laterality: N/A;  . LAPAROSCOPY  10/04/2012   Procedure: LAPAROSCOPY OPERATIVE;  Surgeon: Linda Hedges, DO;  Location: [facility];  Service: Gynecology;  Laterality: N/A;    Social History   Socioeconomic History  . Marital status: Married    Spouse name: [name]  . Number of children: 2  . Years of education: 12+  . Highest education level: Not on file  Occupational History  . Occupation: adm Radiation protection practitioner: [employer]  . Financial resource strain: Not on file  . Food insecurity    Worry: Not on file    Inability: Not on file  . Transportation needs    Medical: Not on file    Non-medical: Not on file  Tobacco Use  . Smoking status: Never Smoker  . Smokeless tobacco: Never Used  Substance and Sexual Activity  . Alcohol use: Yes    Comment: rare  . Drug use: No  . Sexual activity: Yes  Lifestyle  . Physical activity    Days per week: Not on file    Minutes per session: Not on file  . Stress: Not on file  Relationships   . Social Herbalist on phone: Not on file    Gets together: Not on file    Attends religious service: Not on file    Active member of club or organization: Not on file    Attends meetings of clubs or organizations: Not on file    Relationship status: Not on file  Other Topics Concern  . Not on file  Social History Narrative   Regular exercise-no   Caffeine Use-yes    Family History  Problem Relation Age of Onset  . Heart disease Mother   . Arthritis Mother   . Hyperlipidemia Mother   .  Hypertension Mother   . Heart attack Mother   . Cancer Father        Colon Cancer-Polyps  . Hypertension Father   . Hashimoto's thyroiditis Sister   . Diabetes Sister   . Thyroid disease Sister   . Cancer Paternal Grandfather        Colon Cancer  . Colon cancer Paternal Grandfather   . Hashimoto's thyroiditis Sister   . Thyroid disease Sister     Review of Systems  Constitutional: Negative for chills and fever.  HENT: Positive for voice change.   Respiratory: Positive for cough (related to PND). Negative for shortness of breath and wheezing.   Cardiovascular: Negative for chest pain, palpitations and leg swelling.  Gastrointestinal:       Rare gerd  Musculoskeletal: Positive for back pain.  Neurological: Positive for headaches (migraines).       Objective:   Vitals:   05/17/19 0947  BP: 130/84  Pulse: 99  Temp: 98.7 F (37.1 C)  SpO2: 97%   BP Readings from Last 3 Encounters:  05/17/19 130/84  11/16/18 124/78  10/10/18 110/80   Wt Readings from Last 3 Encounters:  05/17/19 186 lb 12 oz (84.7 kg)  03/06/19 180 lb (81.6 kg)  03/05/19 180 lb (81.6 kg)   Body mass index is 29.25 kg/m.   Physical Exam    Constitutional: Appears well-developed and well-nourished. No distress.  HENT:  Head: Normocephalic and atraumatic.  Neck: Neck supple. No tracheal deviation present. No thyromegaly present.  No cervical lymphadenopathy Cardiovascular: Normal rate,  regular rhythm and normal heart sounds.   No murmur heard. No carotid bruit .  No edema Pulmonary/Chest: Effort normal and breath sounds normal. No respiratory distress. No has no wheezes. No rales.  Skin: Skin is warm and dry. Not diaphoretic.  Psychiatric: Normal mood and affect. Behavior is normal.      Assessment & Plan:    See Problem List for Assessment and Plan of chronic medical problems.

## 2019-05-16 NOTE — Patient Instructions (Addendum)
  Tests ordered today. Your results will be released to [facility] (or called to you) after review.  If any changes need to be made, you will be notified at that same time.   Medications reviewed and updated.  Changes include :   imitrex nasal spray for migraines - see if it is covered  Your prescription(s) have been submitted to your pharmacy. Please take as directed and contact our office if you believe you are having problem(s) with the medication(s).    Please followup in 6 months

## 2019-05-17 ENCOUNTER — Encounter: Payer: Self-pay | Admitting: Internal Medicine

## 2019-05-17 ENCOUNTER — Ambulatory Visit (INDEPENDENT_AMBULATORY_CARE_PROVIDER_SITE_OTHER): Payer: Federal, State, Local not specified - PPO | Admitting: Internal Medicine

## 2019-05-17 ENCOUNTER — Other Ambulatory Visit (INDEPENDENT_AMBULATORY_CARE_PROVIDER_SITE_OTHER): Payer: Federal, State, Local not specified - PPO

## 2019-05-17 ENCOUNTER — Other Ambulatory Visit: Payer: Self-pay

## 2019-05-17 VITALS — BP 130/84 | HR 99 | Temp 98.7°F | Ht 67.0 in | Wt 186.8 lb

## 2019-05-17 DIAGNOSIS — E7849 Other hyperlipidemia: Secondary | ICD-10-CM | POA: Diagnosis not present

## 2019-05-17 DIAGNOSIS — K219 Gastro-esophageal reflux disease without esophagitis: Secondary | ICD-10-CM

## 2019-05-17 DIAGNOSIS — R7303 Prediabetes: Secondary | ICD-10-CM | POA: Diagnosis not present

## 2019-05-17 DIAGNOSIS — G43001 Migraine without aura, not intractable, with status migrainosus: Secondary | ICD-10-CM

## 2019-05-17 LAB — COMPREHENSIVE METABOLIC PANEL
ALT: 34 U/L (ref 0–35)
AST: 20 U/L (ref 0–37)
Albumin: 4.6 g/dL (ref 3.5–5.2)
Alkaline Phosphatase: 59 U/L (ref 39–117)
BUN: 16 mg/dL (ref 6–23)
CO2: 30 mEq/L (ref 19–32)
Calcium: 9.5 mg/dL (ref 8.4–10.5)
Chloride: 103 mEq/L (ref 96–112)
Creatinine, Ser: 1.04 mg/dL (ref 0.40–1.20)
GFR: 56.16 mL/min — ABNORMAL LOW (ref >60.00)
Glucose, Bld: 113 mg/dL — ABNORMAL HIGH (ref 70–99)
Potassium: 4.3 mEq/L (ref 3.5–5.1)
Sodium: 140 mEq/L (ref 135–145)
Total Bilirubin: 0.5 mg/dL (ref 0.2–1.2)
Total Protein: 6.9 g/dL (ref 6.0–8.3)

## 2019-05-17 LAB — LIPID PANEL
Cholesterol: 180 mg/dL (ref 0–200)
HDL: 42.9 mg/dL (ref >39.00)
LDL Cholesterol: 106 mg/dL — ABNORMAL HIGH (ref 0–99)
NonHDL: 137.24
Total CHOL/HDL Ratio: 4
Triglycerides: 156 mg/dL — ABNORMAL HIGH (ref 0.0–149.0)
VLDL: 31.2 mg/dL (ref 0.0–40.0)

## 2019-05-17 LAB — HEMOGLOBIN A1C: Hgb A1c MFr Bld: 5.8 % (ref 4.6–6.5)

## 2019-05-17 MED ORDER — SUMATRIPTAN 20 MG/ACT NA SOLN: 20.0000 mg | NASAL | 5 refills | Status: DC | PRN

## 2019-05-17 MED ORDER — KETOCONAZOLE 2 % EX SHAM: 1.0000 "application " | MEDICATED_SHAMPOO | CUTANEOUS | 5 refills | Status: DC

## 2019-05-17 NOTE — Assessment & Plan Note (Signed)
Check lipid panel, cmp Continue daily statin Regular exercise and healthy diet encouraged  

## 2019-05-17 NOTE — Assessment & Plan Note (Signed)
Check a1c Low sugar / carb diet Stressed regular exercise   

## 2019-05-17 NOTE — Assessment & Plan Note (Signed)
Following with GI No GERD symptoms, but atypical GERD symptoms Continue current medications

## 2019-05-17 NOTE — Assessment & Plan Note (Signed)
Has at least 2 migraines a month Zomig pills do work, but not as good as the Zomig nasal spray I had prescribed Imitrex nasal spray, but she did not realize that and never tried it-we will send the pharmacy and she can try that to see if it is more effective than the pills

## 2019-05-19 ENCOUNTER — Encounter: Payer: Self-pay | Admitting: Internal Medicine

## 2019-05-21 ENCOUNTER — Ambulatory Visit: Payer: Self-pay | Admitting: Internal Medicine

## 2019-05-21 NOTE — Progress Notes (Signed)
Subjective:    Patient ID: Pamela Novak, female    DOB: [birth year], 48 y.o.   MRN: [id number]  HPI The patient is here for an acute visit.   She has had pain on her B/l lateral ribs under her armpits for one week.  The right side is worse.  She denies injury or new activities that may have caused it.  She typically only has pain at night.  It was mild during the day.  Tylenol at night helps.  Last night she started to have burning along her proximal arm, armpit and upper lateral ribs.  She noticed some skin discoloration in both arms- subtle yellowish or purplish discoloration.  She did take gabapentin last night because she wondered if it was nerve pain and she is unsure if that helped or if it was the Tylenol.  She denies increased pain with deep breaths.  She denies any back pain or anterior chest pain.   Orange-yellow discoloration of tongue since EGD:  Some comes off with brushing.  An area of the tongue is sensitive.  She has a sore spot.  Her taste sensation is normal, but her mouth is dry.  She has had thrush in the past and was concerned this was thrush.   Medications and allergies reviewed with patient and updated if appropriate.  Patient Active Problem List   Diagnosis Date Noted  . Chronic constipation 11/16/2018  . Neck pain 09/21/2018  . Bite, snake 08/27/2018  . Open bite of right thumb without damage to nail 08/27/2018  . Migraine without aura and with status migrainosus, not intractable 04/12/2018  . Bilateral low back pain 03/21/2018  . Lumbar disc herniation 03/21/2018  . Prediabetes 03/23/2017  . Seborrheic dermatitis of scalp 03/23/2017  . Hyperlipidemia 01/21/2016  . Depression 01/18/2016  . PTSD (post-traumatic stress disorder) 01/18/2016  . GERD (gastroesophageal reflux disease) 01/18/2016  . Varicose veins 05/21/2013  . GAD (generalized anxiety disorder) 11/02/2012    Current Outpatient Medications on File Prior to Visit  Medication Sig Dispense  Refill  . ALPRAZolam (XANAX) 0.25 MG tablet Take 0.25 mg by mouth 2 (two) times daily as needed for anxiety.    [name] Kitchen atorvastatin (LIPITOR) 20 MG tablet Take 1 tablet (20 mg total) by mouth daily. 90 tablet 3  . buPROPion (WELLBUTRIN XL) 150 MG 24 hr tablet Take 150 mg by mouth daily.    . cyclobenzaprine (FLEXERIL) 5 MG tablet TAKE 1 TABLET BY MOUTH THREE TIMES A DAY AS NEEDED FOR MUSCLE SPASMS 45 tablet 0  . Dexlansoprazole (DEXILANT) 30 MG capsule Take 1 capsule (30 mg total) by mouth daily. 30 capsule 3  . DULoxetine HCl 60 MG CSDR Take 120 mg by mouth 2 (two) times a day.     [name] Kitchen EPINEPHrine (AUVI-Q) 0.3 mg/0.3 mL IJ SOAJ injection Inject 0.3 mg into the muscle as needed.    . gabapentin (NEURONTIN) 100 MG capsule TAKE 1 CAPSULE BY MOUTH AT BEDTIME. INCREASE UP TO 300 MG AT NIGHT IF NEEDED 90 capsule 0  . hydrocortisone (ANUSOL-HC) 2.5 % rectal cream Place 1 application rectally 2 (two) times daily. 30 g 1  . hydrocortisone (ANUSOL-HC) 25 MG suppository Place 1 suppository (25 mg total) rectally at bedtime. 12 suppository 1  . ketoconazole (NIZORAL) 2 % shampoo Apply 1 application topically 2 (two) times a week. 120 mL 5  . nortriptyline (PAMELOR) 10 MG capsule Take 1 capsule (10 mg total) by mouth at bedtime. 30 capsule 3  .  polyethylene glycol powder (GLYCOLAX/MIRALAX) 17 GM/SCOOP powder Take half capful daily and titrate based on response to have 1-2 soft bowel movements daily. 255 g 1  . SUMAtriptan (IMITREX) 20 MG/ACT nasal spray Place 1 spray (20 mg total) into the nose every 2 (two) hours as needed for migraine. May repeat in 2 hrs if headache persists or recurs. 1 Inhaler 5  . traMADol (ULTRAM) 50 MG tablet TAKE 1 TABLET (50 MG TOTAL) BY MOUTH EVERY 8 (EIGHT) HOURS AS NEEDED. 21 tablet 1  . zolmitriptan (ZOMIG) 5 MG tablet Take 1 tablet (5 mg total) by mouth as needed for migraine. 10 tablet 0   No current facility-administered medications on file prior to visit.     Past Medical  History:  Diagnosis Date  . Allergy   . Anxiety   . Arthritis   . Blood in stool   . Breast hematoma   . Complication of anesthesia    "woke up during colonoscopy"  . Depression   . GERD (gastroesophageal reflux disease)   . Hyperlipidemia   . Traumatic hematoma of knee     Past Surgical History:  Procedure Laterality Date  . [place] STUDY N/A 04/01/2019   Procedure: [place] STUDY;  Surgeon: Mauri Pole, MD;  [facility];  Service: Endoscopy;  Laterality: N/A;  . ABLATION ON ENDOMETRIOSIS  10/04/2012   Procedure: ABLATION ON ENDOMETRIOSIS;  Surgeon: Linda Hedges, DO;  Location: [facility];  Service: Gynecology;  Laterality: N/A;  . COLONOSCOPY    . CONDYLOMA EXCISION/FULGURATION  10/04/2012   Procedure: CONDYLOMA REMOVAL;  Surgeon: Linda Hedges, DO;  Location: [facility];  Service: Gynecology;  Laterality: N/A;  . DILITATION & CURRETTAGE/HYSTROSCOPY WITH NOVASURE ABLATION  10/04/2012   Procedure: DILATATION & CURETTAGE/HYSTEROSCOPY WITH NOVASURE ABLATION;  Surgeon: Linda Hedges, DO;  Location: [facility];  Service: Gynecology;  Laterality: N/A;  . ESOPHAGEAL MANOMETRY N/A 04/01/2019   Procedure: ESOPHAGEAL MANOMETRY (EM);  Surgeon: Mauri Pole, MD;  [facility];  Service: Endoscopy;  Laterality: N/A;  . LAPAROSCOPY  10/04/2012   Procedure: LAPAROSCOPY OPERATIVE;  Surgeon: Linda Hedges, DO;  Location: [facility];  Service: Gynecology;  Laterality: N/A;    Social History   Socioeconomic History  . Marital status: Married    Spouse name: [name]  . Number of children: 2  . Years of education: 12+  . Highest education level: Not on file  Occupational History  . 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 Financial resource strain: Not on file  . Food insecurity    Worry: Not on file    Inability: Not on file  . Transportation needs    Medical: Not on file    Non-medical: Not on file  Tobacco Use  . Smoking status: Never  Smoker  . Smokeless tobacco: Never Used  Substance and Sexual Activity  . Alcohol use: Yes    Comment: rare  . Drug use: No  . Sexual activity: Yes  Lifestyle  . Physical activity    Days per week: Not on file    Minutes per session: Not on file  . Stress: Not on file  Relationships  . Social Herbalist on phone: Not on file    Gets together: Not on file    Attends religious service: Not on file    Active member of club or organization: Not on file    Attends meetings of clubs  or organizations: Not on file    Relationship status: Not on file  Other Topics Concern  . Not on file  Social History Narrative   Regular exercise-no   Caffeine Use-yes    Family History  Problem Relation Age of Onset  . Heart disease Mother   . Arthritis Mother   . Hyperlipidemia Mother   . Hypertension Mother   . Heart attack Mother   . Cancer Father        Colon Cancer-Polyps  . Hypertension Father   . Hashimoto's thyroiditis Sister   . Diabetes Sister   . Thyroid disease Sister   . Cancer Paternal Grandfather        Colon Cancer  . Colon cancer Paternal Grandfather   . Hashimoto's thyroiditis Sister   . Thyroid disease Sister     Review of Systems  Constitutional: Negative for chills and fever.  HENT: Positive for voice change (related to reflux medication change). Negative for sore throat and trouble swallowing.   Neurological: Negative for weakness, light-headedness, numbness and headaches.       Objective:   Vitals:   05/22/19 0742  BP: 138/86  Pulse: 98  Resp: 16  Temp: 97.6 F (36.4 C)  SpO2: 99%   BP Readings from Last 3 Encounters:  05/22/19 138/86  05/17/19 130/84  11/16/18 124/78   Wt Readings from Last 3 Encounters:  05/22/19 186 lb (84.4 kg)  05/17/19 186 lb 12 oz (84.7 kg)  03/06/19 180 lb (81.6 kg)   Body mass index is 29.13 kg/m.   Physical Exam Constitutional:      General: She is not in acute distress.    Appearance: Normal  appearance. She is not ill-appearing.  HENT:     Head: Normocephalic and atraumatic.     Mouth/Throat:     Comments: Moist, whitish-yellowish coating on tongue, no lesions Cardiovascular:     Rate and Rhythm: Normal rate and regular rhythm.  Pulmonary:     Effort: Pulmonary effort is normal. No respiratory distress.     Breath sounds: Normal breath sounds. No wheezing.  Chest:     Chest wall: Tenderness (Mild tenderness bilateral ribs near armpit) present.  Skin:    General: Skin is warm and dry.     Findings: No bruising or rash.     Comments: Questionable yellowish discoloration proximal, inner right arm, no bruising  Neurological:     Mental Status: She is alert.            Assessment & Plan:    See Problem List for Assessment and Plan of chronic medical problems.

## 2019-05-21 NOTE — Telephone Encounter (Signed)
Seeing you tomorrow for issue.

## 2019-05-21 NOTE — Telephone Encounter (Signed)
Pt. Reports that for 1-2 weeks has had some right sided rib and underarm pain, mainly at night - interrupts her sleep. 6/10 pain scale. Has also has noticed yellowing of the skin at this area " like an old bruise" but has had any injury. No radiation of pain, no shortness of breath. Would like a visit. Warm transfer to Sam in the practice.  Answer Assessment - Initial Assessment Questions 1. LOCATION: "Where does it hurt?"       Right side under arm and rib area 2. RADIATION: "Does the pain go anywhere else?" (e.g., into neck, jaw, arms, back)     No 3. ONSET: "When did the chest pain begin?" (Minutes, hours or days)      Started 1-2 weeks ago 4. PATTERN "Does the pain come and go, or has it been constant since it started?"  "Does it get worse with exertion?"      Hurts worse when she is trying to sleep at night 5. DURATION: "How long does it last" (e.g., seconds, minutes, hours)     Minutes 6. SEVERITY: "How bad is the pain?"  (e.g., Scale 1-10; mild, moderate, or severe)    - MILD (1-3): doesn't interfere with normal activities     - MODERATE (4-7): interferes with normal activities or awakens from sleep    - SEVERE (8-10): excruciating pain, unable to do any normal activities       At night - 6 7. CARDIAC RISK FACTORS: "Do you have any history of heart problems or risk factors for heart disease?" (e.g., angina, prior heart attack; diabetes, high blood pressure, high cholesterol, smoker, or strong family history of heart disease)     No 8. PULMONARY RISK FACTORS: "Do you have any history of lung disease?"  (e.g., blood clots in lung, asthma, emphysema, birth control pills)     No 9. CAUSE: "What do you think is causing the chest pain?"     Unsure 10. OTHER SYMPTOMS: "Do you have any other symptoms?" (e.g., dizziness, nausea, vomiting, sweating, fever, difficulty breathing, cough)       Yellowing of skin like an old bruise 11. PREGNANCY: "Is there any chance you are pregnant?" "When was  your last menstrual period?"       No  Protocols used: CHEST PAIN-A-AH

## 2019-05-22 ENCOUNTER — Ambulatory Visit (INDEPENDENT_AMBULATORY_CARE_PROVIDER_SITE_OTHER): Payer: Federal, State, Local not specified - PPO | Admitting: Internal Medicine

## 2019-05-22 ENCOUNTER — Encounter: Payer: Self-pay | Admitting: Internal Medicine

## 2019-05-22 ENCOUNTER — Other Ambulatory Visit: Payer: Self-pay

## 2019-05-22 DIAGNOSIS — B370 Candidal stomatitis: Secondary | ICD-10-CM

## 2019-05-22 DIAGNOSIS — R0781 Pleurodynia: Secondary | ICD-10-CM | POA: Diagnosis not present

## 2019-05-22 MED ORDER — FLUCONAZOLE 100 MG PO TABS: 100.0000 mg | ORAL_TABLET | Freq: Every day | ORAL | 0 refills | Status: AC

## 2019-05-22 NOTE — Patient Instructions (Signed)
Continue tylenol and gabapentin.  Try ice and heat.   Monitor the skin coloration.  Let me know if there is no improvement.   Take the diflucan for your yeast infection.

## 2019-05-22 NOTE — Assessment & Plan Note (Signed)
Has not tolerated nystatin - will give diflucan 100 mg x 7 days  call if there is no improvement

## 2019-05-22 NOTE — Assessment & Plan Note (Signed)
Bilateral lateral rib pain near her armpits, right side worse than left Also with pain or discomfort going to armpit and proximal arm ?  Skin discoloration on right side, no rash No injuries or new activities that may have caused this Pain seems muscular skeletal in nature No back pain, no respiratory symptoms We will treat symptomatically with Tylenol, gabapentin Can try ice, heat-unable to take NSAIDs She will monitor and let me know if there is no improvement-can consider sports medicine evaluation-?  Radiculopathy

## 2019-05-31 ENCOUNTER — Encounter: Payer: Self-pay | Admitting: Internal Medicine

## 2019-05-31 DIAGNOSIS — K146 Glossodynia: Secondary | ICD-10-CM

## 2019-06-06 ENCOUNTER — Encounter: Payer: Self-pay | Admitting: Internal Medicine

## 2019-06-06 ENCOUNTER — Other Ambulatory Visit: Payer: Self-pay

## 2019-06-06 MED ORDER — DEXILANT 60 MG PO CPDR: 60.0000 mg | DELAYED_RELEASE_CAPSULE | Freq: Every day | ORAL | 3 refills | Status: DC

## 2019-06-10 DIAGNOSIS — A630 Anogenital (venereal) warts: Secondary | ICD-10-CM | POA: Diagnosis not present

## 2019-06-14 ENCOUNTER — Encounter: Payer: Self-pay | Admitting: Internal Medicine

## 2019-06-14 ENCOUNTER — Ambulatory Visit (INDEPENDENT_AMBULATORY_CARE_PROVIDER_SITE_OTHER): Payer: Federal, State, Local not specified - PPO | Admitting: Internal Medicine

## 2019-06-14 ENCOUNTER — Other Ambulatory Visit (INDEPENDENT_AMBULATORY_CARE_PROVIDER_SITE_OTHER): Payer: Federal, State, Local not specified - PPO

## 2019-06-14 ENCOUNTER — Other Ambulatory Visit: Payer: Self-pay

## 2019-06-14 ENCOUNTER — Telehealth: Payer: Self-pay | Admitting: Gastroenterology

## 2019-06-14 VITALS — BP 154/90 | HR 112 | Temp 98.2°F | Ht 67.0 in | Wt 182.5 lb

## 2019-06-14 DIAGNOSIS — R1032 Left lower quadrant pain: Secondary | ICD-10-CM | POA: Diagnosis not present

## 2019-06-14 LAB — URINALYSIS, ROUTINE W REFLEX MICROSCOPIC
Bilirubin Urine: NEGATIVE
Hgb urine dipstick: NEGATIVE
Ketones, ur: NEGATIVE
Leukocytes,Ua: NEGATIVE
Nitrite: NEGATIVE
RBC / HPF: NONE SEEN (ref 0–?)
Specific Gravity, Urine: 1.025 (ref 1.000–1.030)
Total Protein, Urine: NEGATIVE
Urine Glucose: NEGATIVE
Urobilinogen, UA: 0.2 (ref 0.0–1.0)
pH: 6 (ref 5.0–8.0)

## 2019-06-14 LAB — CBC WITH DIFFERENTIAL/PLATELET
Basophils Absolute: 0 10*3/uL (ref 0.0–0.1)
Basophils Relative: 0.7 % (ref 0.0–3.0)
Eosinophils Absolute: 0.1 10*3/uL (ref 0.0–0.7)
Eosinophils Relative: 1.1 % (ref 0.0–5.0)
HCT: 42.6 % (ref 36.0–46.0)
Hemoglobin: 14.2 g/dL (ref 12.0–15.0)
Lymphocytes Relative: 19 % (ref 12.0–46.0)
Lymphs Abs: 1.1 10*3/uL (ref 0.7–4.0)
MCHC: 33.4 g/dL (ref 30.0–36.0)
MCV: 86.8 fl (ref 78.0–100.0)
Monocytes Absolute: 0.6 10*3/uL (ref 0.1–1.0)
Monocytes Relative: 10.3 % (ref 3.0–12.0)
Neutro Abs: 4.1 10*3/uL (ref 1.4–7.7)
Neutrophils Relative %: 68.9 % (ref 43.0–77.0)
Platelets: 250 10*3/uL (ref 150.0–400.0)
RBC: 4.91 Mil/uL (ref 3.87–5.11)
RDW: 13.3 % (ref 11.5–15.5)
WBC: 6 10*3/uL (ref 4.0–10.5)

## 2019-06-14 LAB — COMPREHENSIVE METABOLIC PANEL
ALT: 31 U/L (ref 0–35)
AST: 24 U/L (ref 0–37)
Albumin: 4.6 g/dL (ref 3.5–5.2)
Alkaline Phosphatase: 62 U/L (ref 39–117)
BUN: 10 mg/dL (ref 6–23)
CO2: 30 mEq/L (ref 19–32)
Calcium: 9.8 mg/dL (ref 8.4–10.5)
Chloride: 103 mEq/L (ref 96–112)
Creatinine, Ser: 1.05 mg/dL (ref 0.40–1.20)
GFR: 55.53 mL/min — ABNORMAL LOW (ref >60.00)
Glucose, Bld: 125 mg/dL — ABNORMAL HIGH (ref 70–99)
Potassium: 3.7 mEq/L (ref 3.5–5.1)
Sodium: 140 mEq/L (ref 135–145)
Total Bilirubin: 0.3 mg/dL (ref 0.2–1.2)
Total Protein: 7.1 g/dL (ref 6.0–8.3)

## 2019-06-14 MED ORDER — AMOXICILLIN-POT CLAVULANATE 875-125 MG PO TABS: 1.0000 | ORAL_TABLET | Freq: Two times a day (BID) | ORAL | 0 refills | Status: DC

## 2019-06-14 MED ORDER — HYDROCODONE-ACETAMINOPHEN 5-325 MG PO TABS: 1.0000 | ORAL_TABLET | ORAL | 0 refills | Status: DC | PRN

## 2019-06-14 NOTE — Telephone Encounter (Signed)
Patient is being seen by her PCP for evaluation.

## 2019-06-14 NOTE — Assessment & Plan Note (Signed)
Having left-sided abdominal pain since yesterday-her pain was severe last night, has improved some ?  Related to use of amitiza 3 days ago and then having profuse diarrhea.  She understands with her IBS and anxiety 1 of those could be the driving force Because of the severity last night she did want to have it evaluated No obvious urinary symptoms so unlikely to be UTI Pain sounds GI related-possibly stress related, possibly related to using the amitiza, possibly diverticulitis.  And her colonoscopy she did have a small amount of diverticulosis and this has never bothered her, but that is a possible concern Her symptoms are not typical for diverticulitis Will check urinalysis, CBC, CMP I will give her a few Vicodin to take over the weekend if needed for the severe pain, but she will try to avoid taking this If her pain does not improve over the next 24 hours I will give her a prescription for Augmentin to take in case this is diverticulitis, but most likely she will be able to avoid this-she agrees with plan She will call over the weekend with any questions or concerns and follow-up on Monday if needed

## 2019-06-14 NOTE — Patient Instructions (Addendum)
  Tests ordered today. Your results will be released to [facility] (or called to you) after review.  If any changes need to be made, you will be notified at that same time.  Medications reviewed and updated.  Changes include :   Augmentin for possible diverticulitis.  Take the pain medication for severe pain only.   Your prescription(s) have been submitted to your pharmacy. Please take as directed and contact our office if you believe you are having problem(s) with the medication(s).  Please call if there is no improvement in your symptoms.

## 2019-06-14 NOTE — Progress Notes (Signed)
Subjective:    Patient ID: Pamela Novak, female    DOB: 07-Jan-1971, 48 y.o.   MRN: [id number]  HPI The patient is here for an acute visit for LUQ and LLQ pain  She started to feel abdominal pain yesterday during the day.  She initially felt it in her left upper quadrant under her rib cage.  Last night the pain got much worse and was severe at one point and felt like it was radiating through to her back.  She also has been having left lower quadrant pain that feels more like an intermittent stabbing-like pain.  She has had decreased appetite and has had to force herself to eat the past couple of days.  She has had some mild cramping after eating.  Overall she just does not feel great.  Three days ago she took an [place] for constipation and had a soft stool followed by profuse diarrhea.  And felt like she was doing a colonoscopy prep.  She is unsure if that is related or not.  She has not had a bowel movement since then, but has not been eating much.   Right now she states an uncomfortable feeling in LUQ and intermittent pain in the LLQ.  She is not having any cramping.  She denies any fevers or chills.  She denies urinary symptoms.      Medications and allergies reviewed with patient and updated if appropriate.  Patient Active Problem List   Diagnosis Date Noted  . Oral thrush 05/22/2019  . Rib pain 05/22/2019  . Chronic constipation 11/16/2018  . Neck pain 09/21/2018  . Bite, snake 08/27/2018  . Open bite of right thumb without damage to nail 08/27/2018  . Migraine without aura and with status migrainosus, not intractable 04/12/2018  . Bilateral low back pain 03/21/2018  . Lumbar disc herniation 03/21/2018  . Prediabetes 03/23/2017  . Seborrheic dermatitis of scalp 03/23/2017  . Hyperlipidemia 01/21/2016  . Depression 01/18/2016  . PTSD (post-traumatic stress disorder) 01/18/2016  . GERD (gastroesophageal reflux disease) 01/18/2016  . Varicose veins 05/21/2013  . GAD  (generalized anxiety disorder) 11/02/2012    Current Outpatient Medications on File Prior to Visit  Medication Sig Dispense Refill  . ALPRAZolam (XANAX) 0.25 MG tablet Take 0.25 mg by mouth 2 (two) times daily as needed for anxiety.    [name] Kitchen atorvastatin (LIPITOR) 20 MG tablet Take 1 tablet (20 mg total) by mouth daily. 90 tablet 3  . buPROPion (WELLBUTRIN XL) 150 MG 24 hr tablet Take 150 mg by mouth daily.    . cyclobenzaprine (FLEXERIL) 5 MG tablet TAKE 1 TABLET BY MOUTH THREE TIMES A DAY AS NEEDED FOR MUSCLE SPASMS 45 tablet 0  . dexlansoprazole (DEXILANT) 60 MG capsule Take 1 capsule (60 mg total) by mouth daily. 90 capsule 3  . DULoxetine HCl 60 MG CSDR Take 120 mg by mouth 2 (two) times a day.     [name] Kitchen EPINEPHrine (AUVI-Q) 0.3 mg/0.3 mL IJ SOAJ injection Inject 0.3 mg into the muscle as needed.    . gabapentin (NEURONTIN) 100 MG capsule TAKE 1 CAPSULE BY MOUTH AT BEDTIME. INCREASE UP TO 300 MG AT NIGHT IF NEEDED 90 capsule 0  . hydrocortisone (ANUSOL-HC) 2.5 % rectal cream Place 1 application rectally 2 (two) times daily. 30 g 1  . hydrocortisone (ANUSOL-HC) 25 MG suppository Place 1 suppository (25 mg total) rectally at bedtime. 12 suppository 1  . ketoconazole (NIZORAL) 2 % shampoo Apply 1 application topically 2 (two)  times a week. 120 mL 5  . nortriptyline (PAMELOR) 10 MG capsule Take 1 capsule (10 mg total) by mouth at bedtime. 30 capsule 3  . polyethylene glycol powder (GLYCOLAX/MIRALAX) 17 GM/SCOOP powder Take half capful daily and titrate based on response to have 1-2 soft bowel movements daily. 255 g 1  . SUMAtriptan (IMITREX) 20 MG/ACT nasal spray Place 1 spray (20 mg total) into the nose every 2 (two) hours as needed for migraine. May repeat in 2 hrs if headache persists or recurs. 1 Inhaler 5  . traMADol (ULTRAM) 50 MG tablet TAKE 1 TABLET (50 MG TOTAL) BY MOUTH EVERY 8 (EIGHT) HOURS AS NEEDED. 21 tablet 1  . zolmitriptan (ZOMIG) 5 MG tablet Take 1 tablet (5 mg total) by mouth as  needed for migraine. 10 tablet 0   No current facility-administered medications on file prior to visit.     Past Medical History:  Diagnosis Date  . Allergy   . Anxiety   . Arthritis   . Blood in stool   . Breast hematoma   . Complication of anesthesia    "woke up during colonoscopy"  . Depression   . GERD (gastroesophageal reflux disease)   . Hyperlipidemia   . Traumatic hematoma of knee     Past Surgical History:  Procedure Laterality Date  . [place] STUDY N/A 04/01/2019   Procedure: [place] STUDY;  Surgeon: Mauri Pole, MD;  Location: [facility];  Service: Endoscopy;  Laterality: N/A;  . ABLATION ON ENDOMETRIOSIS  10/04/2012   Procedure: ABLATION ON ENDOMETRIOSIS;  Surgeon: Linda Hedges, DO;  Location: [facility];  Service: Gynecology;  Laterality: N/A;  . COLONOSCOPY    . CONDYLOMA EXCISION/FULGURATION  10/04/2012   Procedure: CONDYLOMA REMOVAL;  Surgeon: Linda Hedges, DO;  Location: [facility];  Service: Gynecology;  Laterality: N/A;  . DILITATION & CURRETTAGE/HYSTROSCOPY WITH NOVASURE ABLATION  10/04/2012   Procedure: DILATATION & CURETTAGE/HYSTEROSCOPY WITH NOVASURE ABLATION;  Surgeon: Linda Hedges, DO;  Location: [facility];  Service: Gynecology;  Laterality: N/A;  . ESOPHAGEAL MANOMETRY N/A 04/01/2019   Procedure: ESOPHAGEAL MANOMETRY (EM);  Surgeon: Mauri Pole, MD;  Location: [facility];  Service: Endoscopy;  Laterality: N/A;  . LAPAROSCOPY  10/04/2012   Procedure: LAPAROSCOPY OPERATIVE;  Surgeon: Linda Hedges, DO;  Location: [facility];  Service: Gynecology;  Laterality: N/A;    Social History   Socioeconomic History  . Marital status: Married    Spouse name: [name]  . Number of children: 2  . Years of education: 12+  . Highest education level: Not on file  Occupational History  . Occupation: adm Radiation protection practitioner: [employer]  . Financial resource strain: Not on file  . Food insecurity    Worry: Not on file    Inability: Not  on file  . Transportation needs    Medical: Not on file    Non-medical: Not on file  Tobacco Use  . Smoking status: Never Smoker  . Smokeless tobacco: Never Used  Substance and Sexual Activity  . Alcohol use: Yes    Comment: rare  . Drug use: No  . Sexual activity: Yes  Lifestyle  . Physical activity    Days per week: Not on file    Minutes per session: Not on file  . Stress: Not on file  Relationships  . Social Herbalist on phone: Not on file    Gets together: Not on file  Attends religious service: Not on file    Active member of club or organization: Not on file    Attends meetings of clubs or organizations: Not on file    Relationship status: Not on file  Other Topics Concern  . Not on file  Social History Narrative   Regular exercise-no   Caffeine Use-yes    Family History  Problem Relation Age of Onset  . Heart disease Mother   . Arthritis Mother   . Hyperlipidemia Mother   . Hypertension Mother   . Heart attack Mother   . Cancer Father        Colon Cancer-Polyps  . Hypertension Father   . Hashimoto's thyroiditis Sister   . Diabetes Sister   . Thyroid disease Sister   . Cancer Paternal Grandfather        Colon Cancer  . Colon cancer Paternal Grandfather   . Hashimoto's thyroiditis Sister   . Thyroid disease Sister     Review of Systems  Constitutional: Positive for appetite change. Negative for chills and fever.  Gastrointestinal: Positive for abdominal pain. Negative for blood in stool, nausea and vomiting.  Genitourinary: Negative for dysuria, frequency and hematuria.  Neurological: Negative for light-headedness and headaches.       Objective:   Vitals:   06/14/19 1559  BP: (!) 154/90  Pulse: (!) 112  Temp: 98.2 F (36.8 C)  SpO2: 98%   BP Readings from Last 3 Encounters:  06/14/19 (!) 154/90  05/22/19 138/86  05/17/19 130/84   Wt Readings from Last 3 Encounters:  06/14/19 182 lb 8 oz (82.8 kg)  05/22/19 186 lb (84.4  kg)  05/17/19 186 lb 12 oz (84.7 kg)   Body mass index is 28.58 kg/m.   Physical Exam Constitutional:      General: She is not in acute distress.    Appearance: Normal appearance. She is not ill-appearing.  HENT:     Head: Normocephalic and atraumatic.  Abdominal:     General: There is no distension.     Palpations: Abdomen is soft. There is no mass.     Tenderness: There is abdominal tenderness (Minimal tenderness right lower quadrant and left lower quadrant, no other tenderness). There is no guarding or rebound.     Hernia: No hernia is present.     Comments: Increased bowel sounds  Skin:    General: Skin is warm and dry.  Neurological:     Mental Status: She is alert.            Assessment & Plan:    See Problem List for Assessment and Plan of chronic medical problems.

## 2019-06-14 NOTE — Telephone Encounter (Signed)
Ok , sounds more musculoskeletal pain based on patient description. Please advise her to contact PMD. Thanks

## 2019-06-14 NOTE — Telephone Encounter (Signed)
Left sided abdominal pain that is worse when she is upright, or laughs or takes deep breaths inward. She is afebrile. Had constipation 3 days ago which she successfully treated with Amitiza. Normal bowel movements now. No nausea, vomiting or fever. No indigestion or reflux symptoms. New on ImiQuimod. Agrees to contact her PCP to inquire about sooner evaluation.

## 2019-06-25 DIAGNOSIS — F4310 Post-traumatic stress disorder, unspecified: Secondary | ICD-10-CM | POA: Diagnosis not present

## 2019-06-25 DIAGNOSIS — F411 Generalized anxiety disorder: Secondary | ICD-10-CM | POA: Diagnosis not present

## 2019-07-01 ENCOUNTER — Encounter: Payer: Self-pay | Admitting: Gastroenterology

## 2019-07-01 ENCOUNTER — Ambulatory Visit: Payer: Federal, State, Local not specified - PPO | Admitting: Gastroenterology

## 2019-07-01 VITALS — BP 140/80 | HR 104 | Temp 98.1°F | Ht 67.0 in | Wt 184.6 lb

## 2019-07-01 DIAGNOSIS — K219 Gastro-esophageal reflux disease without esophagitis: Secondary | ICD-10-CM

## 2019-07-01 MED ORDER — DEXILANT 60 MG PO CPDR: 60.0000 mg | DELAYED_RELEASE_CAPSULE | Freq: Every day | ORAL | 3 refills | Status: DC

## 2019-07-01 NOTE — Progress Notes (Signed)
ARNETT CARDY    KQ:[id number]    08-06-1971  Primary Care Physician:Burns, Claudina Lick, MD  Referring Physician: [name], MD [place],  [place] [id number]   Chief complaint: GERD HPI:  48 year old female with chronic GERD here for follow-up. Esophageal manometry negative for major motility disorder.  24-hour pH impedance showed good acid suppression but increased nonacid reflux events with good symptom correlation suggestive of esophageal hypersensitivity Her reflux symptoms are currently stable on Dexilant daily but she is concerned about potential long-term side effects with chronic PPI use and is interested in undergoing antireflux surgery Denies any dysphagia, odynophagia, nausea, vomiting, abdominal pain, melena or bright red blood per rectum   EGD May 10, 2017 LA grade D reflux esophagitis, gastritis and gastric nodule biopsied negative for dysplasia, intestinal metaplasia or malignancy.  H. pylori negative. Colonoscopy July 04, 2018: 2 sessile serrated adenomatous polyps removed, diverticulosis and hemorrhoids  Outpatient Encounter Medications as of 07/01/2019  Medication Sig  . ALPRAZolam (XANAX) 0.25 MG tablet Take 0.25 mg by mouth 2 (two) times daily as needed for anxiety.  [name] Kitchen amoxicillin-clavulanate (AUGMENTIN) 875-125 MG tablet Take 1 tablet by mouth 2 (two) times daily.  [name] Kitchen atorvastatin (LIPITOR) 20 MG tablet Take 1 tablet (20 mg total) by mouth daily.  [name] Kitchen buPROPion (WELLBUTRIN XL) 150 MG 24 hr tablet Take 150 mg by mouth daily.  . cyclobenzaprine (FLEXERIL) 5 MG tablet TAKE 1 TABLET BY MOUTH THREE TIMES A DAY AS NEEDED FOR MUSCLE SPASMS  . dexlansoprazole (DEXILANT) 60 MG capsule Take 1 capsule (60 mg total) by mouth daily.  . DULoxetine HCl 60 MG CSDR Take 120 mg by mouth 2 (two) times a day.   [name] Kitchen EPINEPHrine (AUVI-Q) 0.3 mg/0.3 mL IJ SOAJ injection Inject 0.3 mg into the muscle as needed.  . gabapentin (NEURONTIN) 100 MG capsule TAKE 1  CAPSULE BY MOUTH AT BEDTIME. INCREASE UP TO 300 MG AT NIGHT IF NEEDED  . HYDROcodone-acetaminophen (NORCO/VICODIN) 5-325 MG tablet Take 1 tablet by mouth every 4 (four) hours as needed for moderate pain.  . hydrocortisone (ANUSOL-HC) 2.5 % rectal cream Place 1 application rectally 2 (two) times daily.  . hydrocortisone (ANUSOL-HC) 25 MG suppository Place 1 suppository (25 mg total) rectally at bedtime.  [name] Kitchen ketoconazole (NIZORAL) 2 % shampoo Apply 1 application topically 2 (two) times a week.  . nortriptyline (PAMELOR) 10 MG capsule Take 1 capsule (10 mg total) by mouth at bedtime.  . polyethylene glycol powder (GLYCOLAX/MIRALAX) 17 GM/SCOOP powder Take half capful daily and titrate based on response to have 1-2 soft bowel movements daily.  . SUMAtriptan (IMITREX) 20 MG/ACT nasal spray Place 1 spray (20 mg total) into the nose every 2 (two) hours as needed for migraine. May repeat in 2 hrs if headache persists or recurs.  [name] Kitchen zolmitriptan (ZOMIG) 5 MG tablet Take 1 tablet (5 mg total) by mouth as needed for migraine.   No facility-administered encounter medications on file as of 07/01/2019.     Allergies as of 07/01/2019 - Review Complete 06/14/2019  Allergen Reaction Noted  . Capsaicin Cough 01/18/2016  . Other Other (See Comments) 09/19/2018  . Flagyl [metronidazole] Rash 09/11/2012    Past Medical History:  Diagnosis Date  . Allergy   . Anxiety   . Arthritis   . Blood in stool   . Breast hematoma   . Complication of anesthesia    "woke up during colonoscopy"  .  Depression   . GERD (gastroesophageal reflux disease)   . Hyperlipidemia   . Traumatic hematoma of knee     Past Surgical History:  Procedure Laterality Date  . [place] STUDY N/A 04/01/2019   Procedure: [place] STUDY;  Surgeon: Mauri Pole, MD;  [facility];  Service: Endoscopy;  Laterality: N/A;  . ABLATION ON ENDOMETRIOSIS  10/04/2012   Procedure: ABLATION ON ENDOMETRIOSIS;  Surgeon: Linda Hedges,  DO;  Location: [facility];  Service: Gynecology;  Laterality: N/A;  . COLONOSCOPY    . CONDYLOMA EXCISION/FULGURATION  10/04/2012   Procedure: CONDYLOMA REMOVAL;  Surgeon: Linda Hedges, DO;  Location: [facility];  Service: Gynecology;  Laterality: N/A;  . DILITATION & CURRETTAGE/HYSTROSCOPY WITH NOVASURE ABLATION  10/04/2012   Procedure: DILATATION & CURETTAGE/HYSTEROSCOPY WITH NOVASURE ABLATION;  Surgeon: Linda Hedges, DO;  Location: [facility];  Service: Gynecology;  Laterality: N/A;  . ESOPHAGEAL MANOMETRY N/A 04/01/2019   Procedure: ESOPHAGEAL MANOMETRY (EM);  Surgeon: Mauri Pole, MD;  Location: [facility];  Service: Endoscopy;  Laterality: N/A;  . LAPAROSCOPY  10/04/2012   Procedure: LAPAROSCOPY OPERATIVE;  Surgeon: Linda Hedges, DO;  Location: [facility];  Service: Gynecology;  Laterality: N/A;    Family History  Problem Relation Age of Onset  . Heart disease Mother   . Arthritis Mother   . Hyperlipidemia Mother   . Hypertension Mother   . Heart attack Mother   . Cancer Father        Colon Cancer-Polyps  . Hypertension Father   . Hashimoto's thyroiditis Sister   . Diabetes Sister   . Thyroid disease Sister   . Cancer Paternal Grandfather        Colon Cancer  . Colon cancer Paternal Grandfather   . Hashimoto's thyroiditis Sister   . Thyroid disease Sister     Social History   Socioeconomic History  . Marital status: Married    Spouse name: [name]  . Number of children: 2  . Years of education: 12+  . Highest education level: Not on file  Occupational History  . 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 Financial resource strain: Not on file  . Food insecurity    Worry: Not on file    Inability: Not on file  . Transportation needs    Medical: Not on file    Non-medical: Not on file  Tobacco Use  . Smoking status: Never Smoker  . Smokeless tobacco: Never Used  Substance and Sexual Activity  . Alcohol use: Yes    Comment: rare  . Drug  use: No  . Sexual activity: Yes  Lifestyle  . Physical activity    Days per week: Not on file    Minutes per session: Not on file  . Stress: Not on file  Relationships  . Social Herbalist on phone: Not on file    Gets together: Not on file    Attends religious service: Not on file    Active member of club or organization: Not on file    Attends meetings of clubs or organizations: Not on file    Relationship status: Not on file  . Intimate partner violence    Fear of current or ex partner: Not on file    Emotionally abused: Not on file    Physically abused: Not on file    Forced sexual activity: Not on file  Other Topics Concern  . Not  on file  Social History Narrative   Regular exercise-no   Caffeine Use-yes      Review of systems: Review of Systems  Constitutional: Negative for fever and chills.  HENT: Negative.   Eyes: Negative for blurred vision.  Respiratory: Negative for cough, shortness of breath and wheezing.   Cardiovascular: Negative for chest pain and palpitations.  Gastrointestinal: as per HPI Genitourinary: Negative for dysuria, urgency, frequency and hematuria.  Musculoskeletal: Negative for myalgias, back pain and joint pain.  Skin: Negative for itching and rash.  Neurological: Negative for dizziness, tremors, focal weakness, seizures and loss of consciousness.  Endo/Heme/Allergies: Positive for seasonal allergies.  Psychiatric/Behavioral: Negative for depression, suicidal ideas and hallucinations.  All other systems reviewed and are negative.   Physical Exam: Vitals:   07/01/19 1418  BP: 140/80  Pulse: (!) 104  Temp: 98.1 F (36.7 C)   There is no height or weight on file to calculate BMI. Gen:      No acute distress HEENT:  EOMI, sclera anicteric Neck:     No masses; no thyromegaly Lungs:    Clear to auscultation bilaterally; normal respiratory effort CV:         Regular rate and rhythm; no murmurs Abd:      + bowel sounds; soft,  non-tender; no palpable masses, no distension Ext:    No edema; adequate peripheral perfusion Skin:      Warm and dry; no rash Neuro: alert and oriented x 3 Psych: normal mood and affect  Data Reviewed:  Reviewed labs, radiology imaging, old records and pertinent past GI work up   Assessment and Plan/Recommendations:  48 year old female with chronic GERD, improvement of symptoms on PPI Evidence of good acid suppression on PPI, elevated nonacid reflux and findings suggestive of esophageal hypersensitivity Patient did not tolerate nortriptyline at bedtime to improve esophageal hypersensitivity She is worried about potential long-term side effects with PPI use Will refer to [facility] Dr. [name] for Nissen fundoplication Continue Dexilant and antireflux measures 25 minutes was spent face-to-face with the patient. Greater than 50% of the time used for counseling as well as treatment plan and follow-up. She had multiple questions which were answered to her satisfaction  K. Denzil Magnuson , MD    CC: [name], MD

## 2019-07-01 NOTE — Patient Instructions (Addendum)
We will refer you to [facility] to see Dr [name], they will contact you to schedule that appointment  We will refill Dexilant and send to your pharmacy  Follow up in 9 months   I appreciate the  opportunity to care for you  Thank You   Harl Bowie , MD

## 2019-07-04 ENCOUNTER — Telehealth: Payer: Self-pay | Admitting: *Deleted

## 2019-07-04 NOTE — Telephone Encounter (Signed)
Records was faxed today to [facility] for referral to [name] for [facility] Fundoplication

## 2019-07-11 DIAGNOSIS — M461 Sacroiliitis, not elsewhere classified: Secondary | ICD-10-CM | POA: Diagnosis not present

## 2019-07-20 ENCOUNTER — Other Ambulatory Visit: Payer: Self-pay | Admitting: Internal Medicine

## 2019-07-23 NOTE — Telephone Encounter (Signed)
07/24/2019 is scheduled to be seen at [facility]

## 2019-07-24 ENCOUNTER — Ambulatory Visit: Payer: Self-pay | Admitting: General Surgery

## 2019-07-24 DIAGNOSIS — F419 Anxiety disorder, unspecified: Secondary | ICD-10-CM | POA: Diagnosis not present

## 2019-07-24 DIAGNOSIS — F329 Major depressive disorder, single episode, unspecified: Secondary | ICD-10-CM | POA: Diagnosis not present

## 2019-07-24 DIAGNOSIS — K210 Gastro-esophageal reflux disease with esophagitis: Secondary | ICD-10-CM | POA: Diagnosis not present

## 2019-07-25 ENCOUNTER — Other Ambulatory Visit: Payer: Self-pay | Admitting: General Surgery

## 2019-07-25 DIAGNOSIS — K2100 Gastro-esophageal reflux disease with esophagitis, without bleeding: Secondary | ICD-10-CM

## 2019-08-01 ENCOUNTER — Ambulatory Visit
Admission: RE | Admit: 2019-08-01 | Discharge: 2019-08-01 | Disposition: A | Discharge status: Home / Self Care | Payer: Federal, State, Local not specified - PPO | Source: Ambulatory Visit | Attending: General Surgery | Admitting: General Surgery

## 2019-08-01 DIAGNOSIS — K2100 Gastro-esophageal reflux disease with esophagitis, without bleeding: Secondary | ICD-10-CM

## 2019-08-01 DIAGNOSIS — K449 Diaphragmatic hernia without obstruction or gangrene: Secondary | ICD-10-CM | POA: Diagnosis not present

## 2019-08-01 DIAGNOSIS — K224 Dyskinesia of esophagus: Secondary | ICD-10-CM | POA: Diagnosis not present

## 2019-08-19 DIAGNOSIS — Z6828 Body mass index (BMI) 28.0-28.9, adult: Secondary | ICD-10-CM | POA: Diagnosis not present

## 2019-08-19 DIAGNOSIS — M461 Sacroiliitis, not elsewhere classified: Secondary | ICD-10-CM | POA: Diagnosis not present

## 2019-08-19 DIAGNOSIS — R030 Elevated blood-pressure reading, without diagnosis of hypertension: Secondary | ICD-10-CM | POA: Diagnosis not present

## 2019-08-22 DIAGNOSIS — Z01419 Encounter for gynecological examination (general) (routine) without abnormal findings: Secondary | ICD-10-CM | POA: Diagnosis not present

## 2019-08-22 DIAGNOSIS — F411 Generalized anxiety disorder: Secondary | ICD-10-CM | POA: Diagnosis not present

## 2019-08-22 DIAGNOSIS — Z6828 Body mass index (BMI) 28.0-28.9, adult: Secondary | ICD-10-CM | POA: Diagnosis not present

## 2019-08-22 DIAGNOSIS — F4310 Post-traumatic stress disorder, unspecified: Secondary | ICD-10-CM | POA: Diagnosis not present

## 2019-08-22 DIAGNOSIS — Z1231 Encounter for screening mammogram for malignant neoplasm of breast: Secondary | ICD-10-CM | POA: Diagnosis not present

## 2019-08-22 DIAGNOSIS — G8929 Other chronic pain: Secondary | ICD-10-CM | POA: Insufficient documentation

## 2019-08-23 ENCOUNTER — Other Ambulatory Visit: Payer: Self-pay | Admitting: Gastroenterology

## 2019-08-28 ENCOUNTER — Ambulatory Visit: Payer: Self-pay | Admitting: General Surgery

## 2019-08-28 DIAGNOSIS — K2100 Gastro-esophageal reflux disease with esophagitis, without bleeding: Secondary | ICD-10-CM | POA: Diagnosis not present

## 2019-08-28 DIAGNOSIS — M7989 Other specified soft tissue disorders: Secondary | ICD-10-CM | POA: Diagnosis not present

## 2019-08-29 ENCOUNTER — Encounter (HOSPITAL_COMMUNITY): Payer: Self-pay

## 2019-08-29 NOTE — Patient Instructions (Addendum)
DUE TO COVID-19 ONLY ONE VISITOR IS ALLOWED TO COME WITH YOU AND STAY IN THE WAITING ROOM ONLY DURING PRE OP AND PROCEDURE. THE ONE VISITOR MAY VISIT WITH YOU IN YOUR PRIVATE ROOM DURING VISITING HOURS ONLY!!   COVID SWAB TESTING MUST BE COMPLETED ON:   Monday, Nov. 2, 2020 at   [address] [place] [facility] enter pre surgical testing line (Must self quarantine after testing. Follow instructions on handout.)             Your procedure is scheduled on: Thursday, Nov. 5, 2020   Report to [facility] Main  Entrance    Report to admitting at 8:45 AM   Call this number if you have problems the morning of surgery [phone]   Do not eat food:After Midnight..   May have liquids until 7:45AM day of surgery   CLEAR LIQUID DIET  Foods Allowed                                                                     Foods Excluded  Water, Black Coffee and tea, regular and decaf                             liquids that you cannot  Plain Jell-O in any flavor  (No red)                                           see through such as: Fruit ices (not with fruit pulp)                                     milk, soups, orange juice  Iced Popsicles (No red)                                    All solid food Carbonated beverages, regular and diet                                    Apple juices Sports drinks like Gatorade (No red) Lightly seasoned clear broth or consume(fat free) Sugar, honey syrup  Sample Menu Breakfast                                Lunch                                     Supper Cranberry juice                    Beef broth                            Chicken broth Jell-O  Grape juice                           Apple juice Coffee or tea                        Jell-O                                      Popsicle                                                Coffee or tea                        Coffee or  tea   Complete one Ensure drink the morning of surgery at 7:45 AM the day of surgery.   Brush your teeth the morning of surgery.   Do NOT smoke after Midnight   Take these medicines the morning of surgery with A SIP OF WATER: Lipitor, Bupropion, Buspirone, Dexlansoprazole, Cymbalta Alprazolam if needed                               You may not have any metal on your body including hair pins, jewelry, and body piercings             Do not wear make-up, lotions, powders, perfumes/cologne, or deodorant             Do not wear nail polish.  Do not shave  48 hours prior to surgery.                 Do not bring valuables to the hospital. [place].   Contacts, dentures or bridgework may not be worn into surgery.    Patients discharged the day of surgery will not be allowed to drive home.   Special Instructions: Bring a copy of your healthcare power of attorney and living will documents         the day of surgery if you haven't scanned them in before.              Please read over the following fact sheets you were given:  [facility] - Preparing for Surgery Before surgery, you can play an important role.  Because skin is not sterile, your skin needs to be as free of germs as possible.  You can reduce the number of germs on your skin by washing with CHG (chlorahexidine gluconate) soap before surgery.  CHG is an antiseptic cleaner which kills germs and bonds with the skin to continue killing germs even after washing. Please DO NOT use if you have an allergy to CHG or antibacterial soaps.  If your skin becomes reddened/irritated stop using the CHG and inform your nurse when you arrive at Short Stay. Do not shave (including legs and underarms) for at least 48 hours prior to the first CHG shower.  You may shave your face/neck.  Please follow these instructions carefully:  1.  Shower with CHG Soap the night before surgery  and the  morning of  surgery.  2.  If you choose to wash your hair, wash your hair first as usual with your normal  shampoo.  3.  After you shampoo, rinse your hair and body thoroughly to remove the shampoo.                             4.  Use CHG as you would any other liquid soap.  You can apply chg directly to the skin and wash.  Gently with a scrungie or clean washcloth.  5.  Apply the CHG Soap to your body ONLY FROM THE NECK DOWN.   Do   not use on face/ open                           Wound or open sores. Avoid contact with eyes, ears mouth and   genitals (private parts).                       Wash face,  Genitals (private parts) with your normal soap.             6.  Wash thoroughly, paying special attention to the area where your    surgery  will be performed.  7.  Thoroughly rinse your body with warm water from the neck down.  8.  DO NOT shower/wash with your normal soap after using and rinsing off the CHG Soap.                9.  Pat yourself dry with a clean towel.            10.  Wear clean pajamas.            11.  Place clean sheets on your bed the night of your first shower and do not  sleep with pets. Day of Surgery : Do not apply any lotions/deodorants the morning of surgery.  Please wear clean clothes to the hospital/surgery center.  FAILURE TO FOLLOW THESE INSTRUCTIONS MAY RESULT IN THE CANCELLATION OF YOUR SURGERY  PATIENT SIGNATURE_________________________________  NURSE SIGNATURE__________________________________  ________________________________________________________________________   Adam Phenix  An incentive spirometer is a tool that can help keep your lungs clear and active. This tool measures how well you are filling your lungs with each breath. Taking long deep breaths may help reverse or decrease the chance of developing breathing (pulmonary) problems (especially infection) following:  A long period of time when you are unable to move or be active. BEFORE THE PROCEDURE    If the spirometer includes an indicator to show your best effort, your nurse or respiratory therapist will set it to a desired goal.  If possible, sit up straight or lean slightly forward. Try not to slouch.  Hold the incentive spirometer in an upright position. INSTRUCTIONS FOR USE  1. Sit on the edge of your bed if possible, or sit up as far as you can in bed or on a chair. 2. Hold the incentive spirometer in an upright position. 3. Breathe out normally. 4. Place the mouthpiece in your mouth and seal your lips tightly around it. 5. Breathe in slowly and as deeply as possible, raising the piston or the ball toward the top of the column. 6. Hold your breath for 3-5 seconds or for as long as possible. Allow the piston or ball to fall to the bottom of  the column. 7. Remove the mouthpiece from your mouth and breathe out normally. 8. Rest for a few seconds and repeat Steps 1 through 7 at least 10 times every 1-2 hours when you are awake. Take your time and take a few normal breaths between deep breaths. 9. The spirometer may include an indicator to show your best effort. Use the indicator as a goal to work toward during each repetition. 10. After each set of 10 deep breaths, practice coughing to be sure your lungs are clear. If you have an incision (the cut made at the time of surgery), support your incision when coughing by placing a pillow or rolled up towels firmly against it. Once you are able to get out of bed, walk around indoors and cough well. You may stop using the incentive spirometer when instructed by your caregiver.  RISKS AND COMPLICATIONS  Take your time so you do not get dizzy or light-headed.  If you are in pain, you may need to take or ask for pain medication before doing incentive spirometry. It is harder to take a deep breath if you are having pain. AFTER USE  Rest and breathe slowly and easily.  It can be helpful to keep track of a log of your progress. Your caregiver  can provide you with a simple table to help with this. If you are using the spirometer at home, follow these instructions: [place] IF:   You are having difficultly using the spirometer.  You have trouble using the spirometer as often as instructed.  Your pain medication is not giving enough relief while using the spirometer.  You develop fever of 100.5 F (38.1 C) or higher. SEEK IMMEDIATE MEDICAL CARE IF:   You cough up bloody sputum that had not been present before.  You develop fever of 102 F (38.9 C) or greater.  You develop worsening pain at or near the incision site. MAKE SURE YOU:   Understand these instructions.  Will watch your condition.  Will get help right away if you are not doing well or get worse. Document Released: 02/27/2007 Document Revised: 01/09/2012 Document Reviewed: 04/30/2007 Exit[employer] Patient Information 2014 [employer], [place].   ________________________________________________________________________  WHAT IS A BLOOD TRANSFUSION? Blood Transfusion Information  A transfusion is the replacement of blood or some of its parts. Blood is made up of multiple cells which provide different functions.  Red blood cells carry oxygen and are used for blood loss replacement.  White blood cells fight against infection.  Platelets control bleeding.  Plasma helps clot blood.  Other blood products are available for specialized needs, such as hemophilia or other clotting disorders. BEFORE THE TRANSFUSION  Who gives blood for transfusions?   Healthy volunteers who are fully evaluated to make sure their blood is safe. This is blood bank blood. Transfusion therapy is the safest it has ever been in the practice of medicine. Before blood is taken from a donor, a complete history is taken to make sure that person has no history of diseases nor engages in risky social behavior (examples are intravenous drug use or sexual activity with multiple partners). The  donor's travel history is screened to minimize risk of transmitting infections, such as malaria. The donated blood is tested for signs of infectious diseases, such as HIV and hepatitis. The blood is then tested to be sure it is compatible with you in order to minimize the chance of a transfusion reaction. If you or a relative donates blood, this is often  done in anticipation of surgery and is not appropriate for emergency situations. It takes many days to process the donated blood. RISKS AND COMPLICATIONS Although transfusion therapy is very safe and saves many lives, the main dangers of transfusion include:   Getting an infectious disease.  Developing a transfusion reaction. This is an allergic reaction to something in the blood you were given. Every precaution is taken to prevent this. The decision to have a blood transfusion has been considered carefully by your caregiver before blood is given. Blood is not given unless the benefits outweigh the risks. AFTER THE TRANSFUSION  Right after receiving a blood transfusion, you will usually feel much better and more energetic. This is especially true if your red blood cells have gotten low (anemic). The transfusion raises the level of the red blood cells which carry oxygen, and this usually causes an energy increase.  The nurse administering the transfusion will monitor you carefully for complications. HOME CARE INSTRUCTIONS  No special instructions are needed after a transfusion. You may find your energy is better. Speak with your caregiver about any limitations on activity for underlying diseases you may have. SEEK MEDICAL CARE IF:   Your condition is not improving after your transfusion.  You develop redness or irritation at the intravenous (IV) site. SEEK IMMEDIATE MEDICAL CARE IF:  Any of the following symptoms occur over the next 12 hours:  Shaking chills.  You have a temperature by mouth above 102 F (38.9 C), not controlled by  medicine.  Chest, back, or muscle pain.  People around you feel you are not acting correctly or are confused.  Shortness of breath or difficulty breathing.  Dizziness and fainting.  You get a rash or develop hives.  You have a decrease in urine output.  Your urine turns a dark color or changes to pink, red, or brown. Any of the following symptoms occur over the next 10 days:  You have a temperature by mouth above 102 F (38.9 C), not controlled by medicine.  Shortness of breath.  Weakness after normal activity.  The white part of the eye turns yellow (jaundice).  You have a decrease in the amount of urine or are urinating less often.  Your urine turns a dark color or changes to pink, red, or brown. Document Released: 10/14/2000 Document Revised: 01/09/2012 Document Reviewed: 06/02/2008 [facility] Patient Information 2014 [place], [place].  _______________________________________________________________________

## 2019-08-30 ENCOUNTER — Other Ambulatory Visit: Payer: Self-pay

## 2019-08-30 ENCOUNTER — Encounter (HOSPITAL_COMMUNITY)
Admission: RE | Admit: 2019-08-30 | Discharge: 2019-08-30 | Disposition: A | Discharge status: Home / Self Care | Payer: Federal, State, Local not specified - PPO | Source: Ambulatory Visit | Attending: General Surgery | Admitting: General Surgery

## 2019-08-30 ENCOUNTER — Encounter (HOSPITAL_COMMUNITY): Payer: Self-pay

## 2019-08-30 DIAGNOSIS — Z01812 Encounter for preprocedural laboratory examination: Secondary | ICD-10-CM | POA: Insufficient documentation

## 2019-08-30 DIAGNOSIS — K219 Gastro-esophageal reflux disease without esophagitis: Secondary | ICD-10-CM | POA: Diagnosis not present

## 2019-08-30 HISTORY — DX: Personal history of colonic polyps: Z86.010

## 2019-08-30 HISTORY — DX: Migraine, unspecified, not intractable, without status migrainosus: G43.909

## 2019-08-30 HISTORY — DX: Prediabetes: R73.03

## 2019-08-30 HISTORY — DX: Unspecified hemorrhoids: K64.9

## 2019-08-30 HISTORY — DX: Restless legs syndrome: G25.81

## 2019-08-30 HISTORY — DX: Irritable bowel syndrome, unspecified: K58.9

## 2019-08-30 HISTORY — DX: Personal history of colon polyps, unspecified: Z86.0100

## 2019-08-30 HISTORY — DX: Diverticulosis of intestine, part unspecified, without perforation or abscess without bleeding: K57.90

## 2019-08-30 HISTORY — DX: Sciatica, unspecified side: M54.30

## 2019-08-30 HISTORY — DX: Unspecified asthma, uncomplicated: J45.909

## 2019-08-30 HISTORY — DX: Constipation, unspecified: K59.00

## 2019-08-30 HISTORY — DX: Asymptomatic varicose veins of unspecified lower extremity: I83.90

## 2019-08-30 LAB — CBC WITH DIFFERENTIAL/PLATELET
Abs Immature Granulocytes: 0.04 10*3/uL (ref 0.00–0.07)
Basophils Absolute: 0 10*3/uL (ref 0.0–0.1)
Basophils Relative: 1 %
Eosinophils Absolute: 0.1 10*3/uL (ref 0.0–0.5)
Eosinophils Relative: 1 %
HCT: 43.9 % (ref 36.0–46.0)
Hemoglobin: 14.1 g/dL (ref 12.0–15.0)
Immature Granulocytes: 1 %
Lymphocytes Relative: 17 %
Lymphs Abs: 1.5 10*3/uL (ref 0.7–4.0)
MCH: 28.9 pg (ref 26.0–34.0)
MCHC: 32.1 g/dL (ref 30.0–36.0)
MCV: 90 fL (ref 80.0–100.0)
Monocytes Absolute: 0.7 10*3/uL (ref 0.1–1.0)
Monocytes Relative: 8 %
Neutro Abs: 6.4 10*3/uL (ref 1.7–7.7)
Neutrophils Relative %: 72 %
Platelets: 284 10*3/uL (ref 150–400)
RBC: 4.88 MIL/uL (ref 3.87–5.11)
RDW: 12.7 % (ref 11.5–15.5)
WBC: 8.8 10*3/uL (ref 4.0–10.5)
nRBC: 0 % (ref 0.0–0.2)

## 2019-08-30 LAB — COMPREHENSIVE METABOLIC PANEL WITH GFR
ALT: 31 U/L (ref 0–44)
AST: 23 U/L (ref 15–41)
Albumin: 4.6 g/dL (ref 3.5–5.0)
Alkaline Phosphatase: 59 U/L (ref 38–126)
Anion gap: 8 (ref 5–15)
BUN: 12 mg/dL (ref 6–20)
CO2: 28 mmol/L (ref 22–32)
Calcium: 9.3 mg/dL (ref 8.9–10.3)
Chloride: 102 mmol/L (ref 98–111)
Creatinine, Ser: 1.16 mg/dL — ABNORMAL HIGH (ref 0.44–1.00)
GFR calc Af Amer: >60 mL/min
GFR calc non Af Amer: 55 mL/min — ABNORMAL LOW
Glucose, Bld: 97 mg/dL (ref 70–99)
Potassium: 3.7 mmol/L (ref 3.5–5.1)
Sodium: 138 mmol/L (ref 135–145)
Total Bilirubin: 1 mg/dL (ref 0.3–1.2)
Total Protein: 7.1 g/dL (ref 6.5–8.1)

## 2019-08-30 LAB — ABO/RH: ABO/RH(D): O POS

## 2019-08-30 LAB — HEMOGLOBIN A1C
Hgb A1c MFr Bld: 5.9 % — ABNORMAL HIGH (ref 4.8–5.6)
Mean Plasma Glucose: 122.63 mg/dL

## 2019-08-30 MED ORDER — ENSURE PRE-SURGERY PO LIQD
296.0000 mL | Freq: Once | ORAL | Status: DC
  Filled 2019-08-30: qty 296

## 2019-08-30 NOTE — Progress Notes (Signed)
SPOKE W/  Ziana     SCREENING SYMPTOMS OF COVID 19:   COUGH--NO  RUNNY NOSE--- NO  SORE THROAT---NO  NASAL CONGESTION----NO  SNEEZING----NO  SHORTNESS OF BREATH---NO  DIFFICULTY BREATHING---NO  TEMP >100.0 -----NO  UNEXPLAINED BODY ACHES------NO  CHILLS -------- NO  HEADACHES ---------NO  LOSS OF SMELL/ TASTE --------NO    HAVE YOU OR ANY FAMILY MEMBER TRAVELLED PAST 14 DAYS OUT OF THE   COUNTY---NO STATE----NO COUNTRY----NO  HAVE YOU OR ANY FAMILY MEMBER BEEN EXPOSED TO ANYONE WITH COVID 19? NO

## 2019-08-30 NOTE — Progress Notes (Addendum)
PCP - Dr. Billey Gosling Cardiologist - N/a  Chest x-ray - N/a EKG - N/a Stress Test - N/a ECHO - N/a Cardiac Cath - N/a  Sleep Study - 2016 CPAP - No OSA  Fasting Blood Sugar - N/a Checks Blood Sugar _N/a____ times a day  Blood Thinner Instructions:  N/a Aspirin Instructions:  N/a Last Dose:  N/a  Anesthesia review: N/a  Patient denies shortness of breath, fever, cough and chest pain at PAT appointment   Patient verbalized understanding of instructions that were given to them at the PAT appointment. Patient was also instructed that they will need to review over the PAT instructions again at home before surgery.

## 2019-08-31 DIAGNOSIS — Z01812 Encounter for preprocedural laboratory examination: Secondary | ICD-10-CM | POA: Diagnosis not present

## 2019-08-31 DIAGNOSIS — K219 Gastro-esophageal reflux disease without esophagitis: Secondary | ICD-10-CM | POA: Diagnosis not present

## 2019-09-02 ENCOUNTER — Other Ambulatory Visit (HOSPITAL_COMMUNITY)
Admission: RE | Admit: 2019-09-02 | Discharge: 2019-09-02 | Disposition: A | Discharge status: Home / Self Care | Payer: Federal, State, Local not specified - PPO | Source: Ambulatory Visit | Attending: General Surgery | Admitting: General Surgery

## 2019-09-02 DIAGNOSIS — Z20828 Contact with and (suspected) exposure to other viral communicable diseases: Secondary | ICD-10-CM | POA: Insufficient documentation

## 2019-09-02 DIAGNOSIS — Z01812 Encounter for preprocedural laboratory examination: Secondary | ICD-10-CM | POA: Diagnosis not present

## 2019-09-03 LAB — NOVEL CORONAVIRUS, NAA (HOSP ORDER, SEND-OUT TO REF LAB; TAT 18-24 HRS): SARS-CoV-2, NAA: NOT DETECTED

## 2019-09-04 MED ORDER — BUPIVACAINE LIPOSOME 1.3 % IJ SUSP
20.0000 mL | Freq: Once | INTRAMUSCULAR | Status: DC
  Filled 2019-09-04: qty 20

## 2019-09-04 NOTE — Anesthesia Preprocedure Evaluation (Addendum)
Anesthesia Evaluation  Patient identified by MRN, date of birth, ID band Patient awake    Reviewed: Allergy & Precautions, NPO status , Patient's Chart, lab work & pertinent test results  Airway Mallampati: I  TM Distance: >3 FB Neck ROM: Full    Dental no notable dental hx. (+) Teeth Intact   Pulmonary asthma ,    Pulmonary exam normal breath sounds clear to auscultation       Cardiovascular Exercise Tolerance: Good Normal cardiovascular exam Rhythm:Regular Rate:Normal     Neuro/Psych  Headaches, PSYCHIATRIC DISORDERS Anxiety    GI/Hepatic GERD  ,  Endo/Other  negative endocrine ROS  Renal/GU K+ 3.7 Cr 1.16     Musculoskeletal  (+) Arthritis ,   Abdominal   Peds  Hematology Hgb14.1   Anesthesia Other Findings   Reproductive/Obstetrics                            Anesthesia Physical Anesthesia Plan  ASA: II  Anesthesia Plan: General   Post-op Pain Management:    Induction: Intravenous  PONV Risk Score and Plan: 3 and Treatment may vary due to age or medical condition, Ondansetron, Dexamethasone, Midazolam and Scopolamine patch - Pre-op  Airway Management Planned: Oral ETT  Additional Equipment: None  Intra-op Plan:   Post-operative Plan: Extubation in OR  Informed Consent: I have reviewed the patients History and Physical, chart, labs and discussed the procedure including the risks, benefits and alternatives for the proposed anesthesia with the patient or authorized representative who has indicated his/her understanding and acceptance.     Dental advisory given  Plan Discussed with: CRNA  Anesthesia Plan Comments: (GA w lidocaine infusion 0.3 mcg/Kg Dexmedotomidine)       Anesthesia Quick Evaluation

## 2019-09-05 ENCOUNTER — Observation Stay (HOSPITAL_COMMUNITY)
Admission: RE | Admit: 2019-09-05 | Discharge: 2019-09-06 | Disposition: A | Discharge status: Home / Self Care | Payer: Federal, State, Local not specified - PPO | Attending: General Surgery | Admitting: General Surgery

## 2019-09-05 ENCOUNTER — Ambulatory Visit (HOSPITAL_COMMUNITY): Payer: Federal, State, Local not specified - PPO | Admitting: Physician Assistant

## 2019-09-05 ENCOUNTER — Encounter (HOSPITAL_COMMUNITY)
Admission: RE | Disposition: A | Discharge status: Home / Self Care | Payer: Self-pay | Source: Home / Self Care | Attending: General Surgery

## 2019-09-05 ENCOUNTER — Ambulatory Visit (HOSPITAL_COMMUNITY): Payer: Federal, State, Local not specified - PPO | Admitting: Anesthesiology

## 2019-09-05 ENCOUNTER — Other Ambulatory Visit: Payer: Self-pay

## 2019-09-05 ENCOUNTER — Encounter (HOSPITAL_COMMUNITY): Payer: Self-pay

## 2019-09-05 DIAGNOSIS — K449 Diaphragmatic hernia without obstruction or gangrene: Secondary | ICD-10-CM | POA: Diagnosis not present

## 2019-09-05 DIAGNOSIS — F411 Generalized anxiety disorder: Secondary | ICD-10-CM | POA: Diagnosis not present

## 2019-09-05 DIAGNOSIS — F4310 Post-traumatic stress disorder, unspecified: Secondary | ICD-10-CM | POA: Diagnosis not present

## 2019-09-05 DIAGNOSIS — M5441 Lumbago with sciatica, right side: Secondary | ICD-10-CM

## 2019-09-05 DIAGNOSIS — J45909 Unspecified asthma, uncomplicated: Secondary | ICD-10-CM | POA: Diagnosis not present

## 2019-09-05 DIAGNOSIS — D1724 Benign lipomatous neoplasm of skin and subcutaneous tissue of left leg: Secondary | ICD-10-CM | POA: Insufficient documentation

## 2019-09-05 DIAGNOSIS — F329 Major depressive disorder, single episode, unspecified: Secondary | ICD-10-CM | POA: Insufficient documentation

## 2019-09-05 DIAGNOSIS — E7800 Pure hypercholesterolemia, unspecified: Secondary | ICD-10-CM | POA: Diagnosis not present

## 2019-09-05 DIAGNOSIS — D1723 Benign lipomatous neoplasm of skin and subcutaneous tissue of right leg: Secondary | ICD-10-CM | POA: Insufficient documentation

## 2019-09-05 DIAGNOSIS — E785 Hyperlipidemia, unspecified: Secondary | ICD-10-CM | POA: Diagnosis not present

## 2019-09-05 DIAGNOSIS — Z79899 Other long term (current) drug therapy: Secondary | ICD-10-CM | POA: Insufficient documentation

## 2019-09-05 DIAGNOSIS — M5126 Other intervertebral disc displacement, lumbar region: Secondary | ICD-10-CM

## 2019-09-05 DIAGNOSIS — G8929 Other chronic pain: Secondary | ICD-10-CM

## 2019-09-05 DIAGNOSIS — K2100 Gastro-esophageal reflux disease with esophagitis, without bleeding: Secondary | ICD-10-CM | POA: Diagnosis not present

## 2019-09-05 DIAGNOSIS — Z9889 Other specified postprocedural states: Secondary | ICD-10-CM | POA: Diagnosis present

## 2019-09-05 DIAGNOSIS — F419 Anxiety disorder, unspecified: Secondary | ICD-10-CM | POA: Diagnosis not present

## 2019-09-05 DIAGNOSIS — K219 Gastro-esophageal reflux disease without esophagitis: Secondary | ICD-10-CM | POA: Diagnosis not present

## 2019-09-05 DIAGNOSIS — M1990 Unspecified osteoarthritis, unspecified site: Secondary | ICD-10-CM | POA: Diagnosis not present

## 2019-09-05 HISTORY — PX: MASS EXCISION: SHX2000

## 2019-09-05 HISTORY — PX: LAPAROSCOPIC NISSEN FUNDOPLICATION: SHX1932

## 2019-09-05 LAB — TYPE AND SCREEN
ABO/RH(D): O POS
Antibody Screen: NEGATIVE

## 2019-09-05 SURGERY — FUNDOPLICATION, NISSEN, LAPAROSCOPIC
Anesthesia: General | Site: Thigh

## 2019-09-05 MED ORDER — DEXAMETHASONE SODIUM PHOSPHATE 4 MG/ML IJ SOLN: 4.0000 mg | INTRAMUSCULAR | Status: DC

## 2019-09-05 MED ORDER — ACETAMINOPHEN 500 MG PO TABS
1000.0000 mg | ORAL_TABLET | ORAL | Status: AC
  Administered 2019-09-05: 1000 mg via ORAL
  Filled 2019-09-05: qty 2

## 2019-09-05 MED ORDER — SODIUM CHLORIDE 0.9 % IV SOLN
2.0000 g | INTRAVENOUS | Status: AC
  Administered 2019-09-05: 11:00:00 2 g via INTRAVENOUS
  Filled 2019-09-05: qty 2

## 2019-09-05 MED ORDER — ONDANSETRON 4 MG PO TBDP: 4.0000 mg | ORAL_TABLET | Freq: Three times a day (TID) | ORAL | Status: DC

## 2019-09-05 MED ORDER — FENTANYL CITRATE (PF) 250 MCG/5ML IJ SOLN
INTRAMUSCULAR | Status: DC | PRN
  Administered 2019-09-05: 100 ug via INTRAVENOUS
  Administered 2019-09-05: 150 ug via INTRAVENOUS

## 2019-09-05 MED ORDER — BUPROPION HCL ER (XL) 300 MG PO TB24
300.0000 mg | ORAL_TABLET | Freq: Every day | ORAL | Status: DC
  Administered 2019-09-06: 300 mg via ORAL
  Filled 2019-09-05: qty 1

## 2019-09-05 MED ORDER — PHENOL 1.4 % MT LIQD
1.0000 | OROMUCOSAL | Status: DC | PRN
  Filled 2019-09-05: qty 177

## 2019-09-05 MED ORDER — ENOXAPARIN SODIUM 40 MG/0.4ML ~~LOC~~ SOLN
40.0000 mg | SUBCUTANEOUS | Status: DC
  Administered 2019-09-06: 40 mg via SUBCUTANEOUS
  Filled 2019-09-05: qty 0.4

## 2019-09-05 MED ORDER — SCOPOLAMINE 1 MG/3DAYS TD PT72
1.0000 | MEDICATED_PATCH | TRANSDERMAL | Status: DC
  Administered 2019-09-05: 1.5 mg via TRANSDERMAL
  Filled 2019-09-05: qty 1

## 2019-09-05 MED ORDER — METHOCARBAMOL 500 MG PO TABS
500.0000 mg | ORAL_TABLET | Freq: Four times a day (QID) | ORAL | Status: DC | PRN
  Administered 2019-09-05: 500 mg via ORAL
  Filled 2019-09-05: qty 1

## 2019-09-05 MED ORDER — MENTHOL 3 MG MT LOZG
1.0000 | LOZENGE | OROMUCOSAL | Status: DC | PRN
  Filled 2019-09-05: qty 9

## 2019-09-05 MED ORDER — SIMETHICONE 80 MG PO CHEW: 40.0000 mg | CHEWABLE_TABLET | Freq: Four times a day (QID) | ORAL | Status: DC | PRN

## 2019-09-05 MED ORDER — DEXAMETHASONE SODIUM PHOSPHATE 10 MG/ML IJ SOLN
INTRAMUSCULAR | Status: DC | PRN
  Administered 2019-09-05: 10 mg via INTRAVENOUS

## 2019-09-05 MED ORDER — LIDOCAINE 2% (20 MG/ML) 5 ML SYRINGE
INTRAMUSCULAR | Status: DC | PRN
  Administered 2019-09-05: 100 mg via INTRAVENOUS

## 2019-09-05 MED ORDER — ACETAMINOPHEN 500 MG PO TABS
1000.0000 mg | ORAL_TABLET | Freq: Four times a day (QID) | ORAL | Status: DC
  Administered 2019-09-05 – 2019-09-06 (×4): 1000 mg via ORAL
  Filled 2019-09-05 (×5): qty 2

## 2019-09-05 MED ORDER — LACTATED RINGERS IR SOLN
Status: DC | PRN
  Administered 2019-09-05: 1000 mL

## 2019-09-05 MED ORDER — DEXMEDETOMIDINE HCL 200 MCG/2ML IV SOLN
INTRAVENOUS | Status: DC | PRN
  Administered 2019-09-05: 12 ug via INTRAVENOUS
  Administered 2019-09-05: 8 ug via INTRAVENOUS

## 2019-09-05 MED ORDER — MIDAZOLAM HCL 2 MG/2ML IJ SOLN
INTRAMUSCULAR | Status: AC
  Filled 2019-09-05: qty 4

## 2019-09-05 MED ORDER — PROPOFOL 10 MG/ML IV BOLUS
INTRAVENOUS | Status: AC
  Filled 2019-09-05: qty 20

## 2019-09-05 MED ORDER — FENTANYL CITRATE (PF) 100 MCG/2ML IJ SOLN
25.0000 ug | INTRAMUSCULAR | Status: DC | PRN
  Administered 2019-09-05 (×2): 50 ug via INTRAVENOUS

## 2019-09-05 MED ORDER — KCL IN DEXTROSE-NACL 20-5-0.45 MEQ/L-%-% IV SOLN
INTRAVENOUS | Status: DC
  Administered 2019-09-05 – 2019-09-06 (×2): via INTRAVENOUS
  Filled 2019-09-05 (×2): qty 1000

## 2019-09-05 MED ORDER — LACTATED RINGERS IV SOLN
INTRAVENOUS | Status: DC
  Administered 2019-09-05: 09:00:00 via INTRAVENOUS

## 2019-09-05 MED ORDER — MIDAZOLAM HCL 2 MG/2ML IJ SOLN
INTRAMUSCULAR | Status: DC | PRN
  Administered 2019-09-05 (×2): 2 mg via INTRAVENOUS

## 2019-09-05 MED ORDER — SUGAMMADEX SODIUM 200 MG/2ML IV SOLN
INTRAVENOUS | Status: DC | PRN
  Administered 2019-09-05: 160 mg via INTRAVENOUS

## 2019-09-05 MED ORDER — CHLORHEXIDINE GLUCONATE CLOTH 2 % EX PADS: 6.0000 | MEDICATED_PAD | Freq: Once | CUTANEOUS | Status: DC

## 2019-09-05 MED ORDER — HEPARIN SODIUM (PORCINE) 5000 UNIT/ML IJ SOLN
5000.0000 [IU] | Freq: Once | INTRAMUSCULAR | Status: AC
  Administered 2019-09-05: 5000 [IU] via SUBCUTANEOUS
  Filled 2019-09-05: qty 1

## 2019-09-05 MED ORDER — GABAPENTIN 300 MG PO CAPS
300.0000 mg | ORAL_CAPSULE | ORAL | Status: AC
  Administered 2019-09-05: 300 mg via ORAL
  Filled 2019-09-05: qty 1

## 2019-09-05 MED ORDER — DULOXETINE HCL 60 MG PO CPEP
120.0000 mg | ORAL_CAPSULE | Freq: Every day | ORAL | Status: DC
  Administered 2019-09-06: 120 mg via ORAL
  Filled 2019-09-05: qty 2

## 2019-09-05 MED ORDER — ALPRAZOLAM 0.25 MG PO TABS
0.2500 mg | ORAL_TABLET | Freq: Three times a day (TID) | ORAL | Status: DC | PRN
  Administered 2019-09-06: 0.25 mg via ORAL
  Filled 2019-09-05: qty 1

## 2019-09-05 MED ORDER — FENTANYL CITRATE (PF) 250 MCG/5ML IJ SOLN
INTRAMUSCULAR | Status: AC
  Filled 2019-09-05: qty 5

## 2019-09-05 MED ORDER — DIPHENHYDRAMINE HCL 50 MG/ML IJ SOLN: 12.5000 mg | Freq: Four times a day (QID) | INTRAMUSCULAR | Status: DC | PRN

## 2019-09-05 MED ORDER — BUPIVACAINE-EPINEPHRINE (PF) 0.25% -1:200000 IJ SOLN
INTRAMUSCULAR | Status: DC | PRN
  Administered 2019-09-05: 15 mL

## 2019-09-05 MED ORDER — ONDANSETRON HCL 4 MG/2ML IJ SOLN
4.0000 mg | Freq: Three times a day (TID) | INTRAMUSCULAR | Status: DC
  Administered 2019-09-05 – 2019-09-06 (×2): 4 mg via INTRAVENOUS
  Filled 2019-09-05 (×2): qty 2

## 2019-09-05 MED ORDER — PROMETHAZINE HCL 25 MG/ML IJ SOLN: 12.5000 mg | Freq: Four times a day (QID) | INTRAMUSCULAR | Status: DC | PRN

## 2019-09-05 MED ORDER — BUPIVACAINE LIPOSOME 1.3 % IJ SUSP
INTRAMUSCULAR | Status: DC | PRN
  Administered 2019-09-05: 20 mL

## 2019-09-05 MED ORDER — LABETALOL HCL 5 MG/ML IV SOLN
INTRAVENOUS | Status: DC | PRN
  Administered 2019-09-05: 10 mg via INTRAVENOUS

## 2019-09-05 MED ORDER — ONDANSETRON HCL 4 MG/2ML IJ SOLN
INTRAMUSCULAR | Status: DC | PRN
  Administered 2019-09-05: 4 mg via INTRAVENOUS

## 2019-09-05 MED ORDER — SUCCINYLCHOLINE CHLORIDE 20 MG/ML IJ SOLN
INTRAMUSCULAR | Status: DC | PRN
  Administered 2019-09-05: 100 mg via INTRAVENOUS

## 2019-09-05 MED ORDER — MORPHINE SULFATE (PF) 2 MG/ML IV SOLN
1.0000 mg | INTRAVENOUS | Status: DC | PRN
  Administered 2019-09-05: 1 mg via INTRAVENOUS
  Administered 2019-09-06: 2 mg via INTRAVENOUS
  Filled 2019-09-05 (×2): qty 1

## 2019-09-05 MED ORDER — GABAPENTIN 300 MG PO CAPS
300.0000 mg | ORAL_CAPSULE | Freq: Two times a day (BID) | ORAL | Status: DC
  Administered 2019-09-05 – 2019-09-06 (×2): 300 mg via ORAL
  Filled 2019-09-05 (×2): qty 1

## 2019-09-05 MED ORDER — OXYCODONE HCL 5 MG/5ML PO SOLN
5.0000 mg | ORAL | Status: DC | PRN
  Administered 2019-09-05 – 2019-09-06 (×3): 5 mg via ORAL
  Filled 2019-09-05 (×3): qty 5

## 2019-09-05 MED ORDER — OXYCODONE HCL 5 MG/5ML PO SOLN: 5.0000 mg | Freq: Once | ORAL | Status: DC | PRN

## 2019-09-05 MED ORDER — DIPHENHYDRAMINE HCL 12.5 MG/5ML PO ELIX: 12.5000 mg | ORAL_SOLUTION | Freq: Four times a day (QID) | ORAL | Status: DC | PRN

## 2019-09-05 MED ORDER — PROPOFOL 10 MG/ML IV BOLUS
INTRAVENOUS | Status: DC | PRN
  Administered 2019-09-05: 140 mg via INTRAVENOUS

## 2019-09-05 MED ORDER — OXYCODONE HCL 5 MG PO TABS: 5.0000 mg | ORAL_TABLET | Freq: Once | ORAL | Status: DC | PRN

## 2019-09-05 MED ORDER — BUPIVACAINE-EPINEPHRINE (PF) 0.25% -1:200000 IJ SOLN
INTRAMUSCULAR | Status: DC | PRN
  Administered 2019-09-05: 35 mL

## 2019-09-05 MED ORDER — SODIUM CHLORIDE 0.9 % IR SOLN
Status: DC | PRN
  Administered 2019-09-05: 1000 mL

## 2019-09-05 MED ORDER — FENTANYL CITRATE (PF) 100 MCG/2ML IJ SOLN
INTRAMUSCULAR | Status: AC
  Filled 2019-09-05: qty 4

## 2019-09-05 MED ORDER — SUMATRIPTAN 20 MG/ACT NA SOLN: 20.0000 mg | NASAL | Status: DC | PRN

## 2019-09-05 MED ORDER — PHENYLEPHRINE HCL-NACL 20-0.9 MG/250ML-% IV SOLN
INTRAVENOUS | Status: DC | PRN
  Administered 2019-09-05: 50 ug/min via INTRAVENOUS

## 2019-09-05 MED ORDER — ONDANSETRON HCL 4 MG/2ML IJ SOLN: 4.0000 mg | Freq: Once | INTRAMUSCULAR | Status: DC | PRN

## 2019-09-05 MED ORDER — PANTOPRAZOLE SODIUM 40 MG IV SOLR
40.0000 mg | Freq: Every day | INTRAVENOUS | Status: DC
  Administered 2019-09-05: 40 mg via INTRAVENOUS
  Filled 2019-09-05: qty 40

## 2019-09-05 MED ORDER — ROCURONIUM BROMIDE 10 MG/ML (PF) SYRINGE
PREFILLED_SYRINGE | INTRAVENOUS | Status: DC | PRN
  Administered 2019-09-05: 20 mg via INTRAVENOUS
  Administered 2019-09-05: 40 mg via INTRAVENOUS
  Administered 2019-09-05: 20 mg via INTRAVENOUS

## 2019-09-05 MED ORDER — BUSPIRONE HCL 10 MG PO TABS
10.0000 mg | ORAL_TABLET | Freq: Every morning | ORAL | Status: DC
  Administered 2019-09-06: 10 mg via ORAL
  Filled 2019-09-05: qty 1

## 2019-09-05 MED ORDER — LIDOCAINE 20MG/ML (2%) 15 ML SYRINGE OPTIME
INTRAMUSCULAR | Status: DC | PRN
  Administered 2019-09-05: 1.5 mg/kg/h via INTRAVENOUS

## 2019-09-05 SURGICAL SUPPLY — 91 items
ADH SKN CLS APL DERMABOND .7 (GAUZE/BANDAGES/DRESSINGS) ×3
APL SKNCLS STERI-STRIP NONHPOA (GAUZE/BANDAGES/DRESSINGS) ×3
APL SWBSTK 6 STRL LF DISP (MISCELLANEOUS) ×3
APPLICATOR COTTON TIP 6 STRL (MISCELLANEOUS) ×3 IMPLANT
APPLICATOR COTTON TIP 6IN STRL (MISCELLANEOUS) ×4
APPLIER CLIP ROT 10 11.4 M/L (STAPLE)
APR CLP MED LRG 11.4X10 (STAPLE)
BENZOIN TINCTURE PRP APPL 2/3 (GAUZE/BANDAGES/DRESSINGS) ×1 IMPLANT
BLADE EXTENDED COATED 6.5IN (ELECTRODE) IMPLANT
BLADE HEX COATED 2.75 (ELECTRODE) ×4 IMPLANT
BNDG ADH 1X3 SHEER STRL LF (GAUZE/BANDAGES/DRESSINGS) ×1 IMPLANT
BNDG ADH THN 3X1 STRL LF (GAUZE/BANDAGES/DRESSINGS) ×3
CABLE HIGH FREQUENCY MONO STRZ (ELECTRODE) IMPLANT
CLIP APPLIE ROT 10 11.4 M/L (STAPLE) IMPLANT
COVER MAYO STAND STRL (DRAPES) IMPLANT
COVER SURGICAL LIGHT HANDLE (MISCELLANEOUS) ×4 IMPLANT
COVER WAND RF STERILE (DRAPES) IMPLANT
DECANTER SPIKE VIAL GLASS SM (MISCELLANEOUS) ×4 IMPLANT
DERMABOND ADVANCED (GAUZE/BANDAGES/DRESSINGS) ×1
DERMABOND ADVANCED .7 DNX12 (GAUZE/BANDAGES/DRESSINGS) IMPLANT
DEVICE SUT QUICK LOAD TK 5 (STAPLE) IMPLANT
DEVICE SUT TI-KNOT TK 5X26 (MISCELLANEOUS) ×1 IMPLANT
DEVICE SUTURE ENDOST 10MM (ENDOMECHANICALS) ×4 IMPLANT
DISSECTOR BLUNT TIP ENDO 5MM (MISCELLANEOUS) ×4 IMPLANT
DRAIN PENROSE 18X1/2 LTX STRL (DRAIN) ×4 IMPLANT
DRAPE LAPAROTOMY T 102X78X121 (DRAPES) IMPLANT
DRAPE LAPAROTOMY TRNSV 102X78 (DRAPES) IMPLANT
DRAPE SHEET LG 3/4 BI-LAMINATE (DRAPES) IMPLANT
DRAPE UTILITY XL STRL (DRAPES) ×4 IMPLANT
DRAPE WARM FLUID 44X44 (DRAPES) IMPLANT
ELECT L-HOOK LAP 45CM DISP (ELECTROSURGICAL)
ELECT PENCIL ROCKER SW 15FT (MISCELLANEOUS) IMPLANT
ELECT REM PT RETURN 15FT ADLT (MISCELLANEOUS) ×4 IMPLANT
ELECTRODE L-HOOK LAP 45CM DISP (ELECTROSURGICAL) ×3 IMPLANT
GAUZE PACKING IODOFORM 1/4X15 (GAUZE/BANDAGES/DRESSINGS) IMPLANT
GAUZE SPONGE 4X4 12PLY STRL (GAUZE/BANDAGES/DRESSINGS) ×4 IMPLANT
GLOVE BIO SURGEON STRL SZ7.5 (GLOVE) ×4 IMPLANT
GLOVE BIOGEL M STRL SZ7.5 (GLOVE) ×4 IMPLANT
GLOVE BIOGEL PI IND STRL 7.0 (GLOVE) ×3 IMPLANT
GLOVE BIOGEL PI IND STRL 8 (GLOVE) ×3 IMPLANT
GLOVE BIOGEL PI INDICATOR 7.0 (GLOVE) ×1
GLOVE BIOGEL PI INDICATOR 8 (GLOVE) ×1
GLOVE ECLIPSE 8.0 STRL XLNG CF (GLOVE) ×4 IMPLANT
GLOVE INDICATOR 8.0 STRL GRN (GLOVE) ×8 IMPLANT
GOWN STRL REUS W/TWL LRG LVL3 (GOWN DISPOSABLE) ×4 IMPLANT
GOWN STRL REUS W/TWL XL LVL3 (GOWN DISPOSABLE) ×12 IMPLANT
HANDLE SUCTION POOLE (INSTRUMENTS) IMPLANT
KIT BASIN OR (CUSTOM PROCEDURE TRAY) ×4 IMPLANT
KIT TURNOVER KIT A (KITS) IMPLANT
MARKER SKIN DUAL TIP RULER LAB (MISCELLANEOUS) IMPLANT
NDL HYPO 25X1 1.5 SAFETY (NEEDLE) ×3 IMPLANT
NEEDLE HYPO 25X1 1.5 SAFETY (NEEDLE) ×4 IMPLANT
NS IRRIG 1000ML POUR BTL (IV SOLUTION) ×4 IMPLANT
PACK GENERAL/GYN (CUSTOM PROCEDURE TRAY) ×4 IMPLANT
PACK UNIVERSAL I (CUSTOM PROCEDURE TRAY) ×4 IMPLANT
PENCIL SMOKE EVACUATOR (MISCELLANEOUS) IMPLANT
SCISSORS LAP 5X45 EPIX DISP (ENDOMECHANICALS) ×4 IMPLANT
SET IRRIG TUBING LAPAROSCOPIC (IRRIGATION / IRRIGATOR) ×4 IMPLANT
SET TUBE SMOKE EVAC HIGH FLOW (TUBING) ×4 IMPLANT
SHEARS HARMONIC ACE PLUS 45CM (MISCELLANEOUS) ×4 IMPLANT
SLEEVE XCEL OPT CAN 5 100 (ENDOMECHANICALS) ×12 IMPLANT
SPONGE LAP 18X18 RF (DISPOSABLE) IMPLANT
SPONGE LAP 4X18 RFD (DISPOSABLE) IMPLANT
STAPLER VISISTAT 35W (STAPLE) ×4 IMPLANT
STRIP CLOSURE SKIN 1/2X4 (GAUZE/BANDAGES/DRESSINGS) ×1 IMPLANT
SUCTION POOLE HANDLE (INSTRUMENTS)
SUT MNCRL AB 4-0 PS2 18 (SUTURE) IMPLANT
SUT PDS AB 1 CTX 36 (SUTURE) IMPLANT
SUT SILK 2 0 (SUTURE)
SUT SILK 2 0 SH CR/8 (SUTURE) IMPLANT
SUT SILK 2-0 18XBRD TIE 12 (SUTURE) IMPLANT
SUT SILK 3 0 (SUTURE)
SUT SILK 3 0 SH CR/8 (SUTURE) IMPLANT
SUT SILK 3-0 18XBRD TIE 12 (SUTURE) IMPLANT
SUT SURGIDAC NAB ES-9 0 48 120 (SUTURE) ×16 IMPLANT
SUT VIC AB 3-0 SH 18 (SUTURE) IMPLANT
SUT VIC AB 4-0 SH 18 (SUTURE) ×4 IMPLANT
SUT VICRYL 2 0 18  UND BR (SUTURE)
SUT VICRYL 2 0 18 UND BR (SUTURE) IMPLANT
SYR CONTROL 10ML LL (SYRINGE) ×4 IMPLANT
TIP INNERVISION DETACH 40FR (MISCELLANEOUS) IMPLANT
TIP INNERVISION DETACH 50FR (MISCELLANEOUS) IMPLANT
TIP INNERVISION DETACH 56FR (MISCELLANEOUS) IMPLANT
TIPS INNERVISION DETACH 40FR (MISCELLANEOUS)
TOWEL OR 17X26 10 PK STRL BLUE (TOWEL DISPOSABLE) ×8 IMPLANT
TOWEL OR NON WOVEN STRL DISP B (DISPOSABLE) ×4 IMPLANT
TRAY LAPAROSCOPIC (CUSTOM PROCEDURE TRAY) ×4 IMPLANT
TROCAR BLADELESS OPT 5 100 (ENDOMECHANICALS) ×4 IMPLANT
TROCAR XCEL BLUNT TIP 100MML (ENDOMECHANICALS) IMPLANT
TROCAR XCEL NON-BLD 11X100MML (ENDOMECHANICALS) ×4 IMPLANT
YANKAUER SUCT BULB TIP NO VENT (SUCTIONS) IMPLANT

## 2019-09-05 NOTE — Discharge Instructions (Signed)
EATING AFTER YOUR ESOPHAGEAL SURGERY (Stomach Fundoplication, Hiatal Hernia repair, Achalasia surgery, etc)  ######################################################################  EAT Start with a pureed / full liquid diet (see below) Gradually transition to a high fiber diet with a fiber supplement over the next month after discharge.    WALK Walk an hour a day.  Control your pain to do that.    CONTROL PAIN Control pain so that you can walk, sleep, tolerate sneezing/coughing, go up/down stairs.  HAVE A BOWEL MOVEMENT DAILY Keep your bowels regular to avoid problems.  OK to try a laxative to override constipation.  OK to use an antidairrheal to slow down diarrhea.  Call if not better after 2 tries  CALL IF YOU HAVE PROBLEMS/CONCERNS Call if you are still struggling despite following these instructions. Call if you have concerns not answered by these instructions  ######################################################################   After your esophageal surgery, expect some sticking with swallowing over the next 1-2 months.    If food sticks when you eat, it is called "dysphagia".  This is due to swelling around your esophagus at the wrap & hiatal diaphragm repair.  It will gradually ease off over the next few months.  To help you through this temporary phase, we start you out on a pureed (blenderized) diet.  Your first meal in the hospital was thin liquids.  You should have been given a pureed diet by the time you left the hospital.  We ask patients to stay on a pureed diet for the first 2-3 weeks to avoid anything getting "stuck" near your recent surgery.  Don't be alarmed if your ability to swallow doesn't progress according to this plan.  Everyone is different and some diets can advance more or less quickly.    It is often helpful to crush your medications or split them as they can sometimes stick, especially the first week or so.   Some BASIC RULES to follow  are:  Maintain an upright position whenever eating or drinking.  Take small bites - just a teaspoon size bite at a time.  Eat slowly.  It may also help to eat only one food at a time.  Consider nibbling through smaller, more frequent meals & avoid the urge to eat BIG meals  Do not push through feelings of fullness, nausea, or bloatedness  Do not mix solid foods and liquids in the same mouthful  Try not to "wash foods down" with large gulps of liquids.  Avoid carbonated (bubbly/fizzy) drinks.    Avoid foods that make you feel gassy or bloated.  Start with bland foods first.  Wait on trying greasy, fried, or spicy meals until you are tolerating more bland solids well.  Understand that it will be hard to burp and belch at first.  This gradually improves with time.  Expect to be more gassy/flatulent/bloated initially.  Walking will help your body manage it better.  Consider using medications for bloating that contain simethicone such as  Maalox or Gas-X   Consider crushing her medications, especially smaller pills.  The ability to swallow pills should get easier after a few weeks  Eat in a relaxed atmosphere & minimize distractions.  Avoid talking while eating.    Do not use straws.  Following each meal, sit in an upright position (90 degree angle) for 60 to 90 minutes.  Going for a short walk can help as well  If food does stick, don't panic.  Try to relax and let the food pass on its own.    Sipping WARM LIQUID such as strong hot black tea can also help slide it down.   Be gradual in changes & use common sense:  -If you easily tolerating a certain "level" of foods, advance to the next level gradually -If you are having trouble swallowing a particular food, then avoid it.   -If food is sticking when you advance your diet, go back to thinner previous diet (the lower LEVEL) for 1-2 days.  LEVEL 1 = PUREED DIET  Do for the first 2 WEEKS AFTER SURGERY  -Foods in this group are  pureed or blenderized to a smooth, mashed potato-like consistency.  -If necessary, the pureed foods can keep their shape with the addition of a thickening agent.   -Meat should be pureed to a smooth, pasty consistency.  Hot broth or gravy may be added to the pureed meat, approximately 1 oz. of liquid per 3 oz. serving of meat. -CAUTION:  If any foods do not puree into a smooth consistency, swallowing will be more difficult.  (For example, nuts or seeds sometimes do not blend well.)  Hot Foods Cold Foods  Pureed scrambled eggs and cheese Pureed cottage cheese  Baby cereals Thickened juices and nectars  Thinned cooked cereals (no lumps) Thickened milk or eggnog  Pureed French toast or pancakes Ensure  Mashed potatoes Ice cream  Pureed parsley, au gratin, scalloped potatoes, candied sweet potatoes Fruit or Italian ice, sherbet  Pureed buttered or alfredo noodles Plain yogurt  Pureed vegetables (no corn or peas) Instant breakfast  Pureed soups and creamed soups Smooth pudding, mousse, custard  Pureed scalloped apples Whipped gelatin  Gravies Sugar, syrup, honey, jelly  Sauces, cheese, tomato, barbecue, white, creamed Cream  Any baby food Creamer  Alcohol in moderation (not beer or champagne) Margarine  Coffee or tea Mayonnaise   Ketchup, mustard   Apple sauce   SAMPLE MENU:  PUREED DIET Breakfast Lunch Dinner   Orange juice, 1/2 cup  Cream of wheat, 1/2 cup  Pineapple juice, 1/2 cup  Pureed turkey, barley soup, 3/4 cup  Pureed Hawaiian chicken, 3 oz   Scrambled eggs, mashed or blended with cheese, 1/2 cup  Tea or coffee, 1 cup   Whole milk, 1 cup   Non-dairy creamer, 2 Tbsp.  Mashed potatoes, 1/2 cup  Pureed cooled broccoli, 1/2 cup  Apple sauce, 1/2 cup  Coffee or tea  Mashed potatoes, 1/2 cup  Pureed spinach, 1/2 cup  Frozen yogurt, 1/2 cup  Tea or coffee      LEVEL 2 = SOFT DIET  After your first 2 weeks, you can advance to a soft diet.   Keep on this  diet until everything goes down easily.  Hot Foods Cold Foods  White fish Cottage cheese  Stuffed fish Junior baby fruit  Baby food meals Semi thickened juices  Minced soft cooked, scrambled, poached eggs nectars  Souffle & omelets Ripe mashed bananas  Cooked cereals Canned fruit, pineapple sauce, milk  potatoes Milkshake  Buttered or Alfredo noodles Custard  Cooked cooled vegetable Puddings, including tapioca  Sherbet Yogurt  Vegetable soup or alphabet soup Fruit ice, Italian ice  Gravies Whipped gelatin  Sugar, syrup, honey, jelly Junior baby desserts  Sauces:  Cheese, creamed, barbecue, tomato, white Cream  Coffee or tea Margarine   SAMPLE MENU:  LEVEL 2 Breakfast Lunch Dinner   Orange juice, 1/2 cup  Oatmeal, 1/2 cup  Scrambled eggs with cheese, 1/2 cup  Decaffeinated tea, 1 cup  Whole milk, 1 cup    Non-dairy creamer, 2 Tbsp  Pineapple juice, 1/2 cup  Minced beef, 3 oz  Gravy, 2 Tbsp  Mashed potatoes, 1/2 cup  Minced fresh broccoli, 1/2 cup  Applesauce, 1/2 cup  Coffee, 1 cup  Turkey, barley soup, 3/4 cup  Minced Hawaiian chicken, 3 oz  Mashed potatoes, 1/2 cup  Cooked spinach, 1/2 cup  Frozen yogurt, 1/2 cup  Non-dairy creamer, 2 Tbsp      LEVEL 3 = CHOPPED DIET  -After all the foods in level 2 (soft diet) are passing through well you should advance up to more chopped foods.  -It is still important to cut these foods into small pieces and eat slowly.  Hot Foods Cold Foods  Poultry Cottage cheese  Chopped Swedish meatballs Yogurt  Meat salads (ground or flaked meat) Milk  Flaked fish (tuna) Milkshakes  Poached or scrambled eggs Soft, cold, dry cereal  Souffles and omelets Fruit juices or nectars  Cooked cereals Chopped canned fruit  Chopped French toast or pancakes Canned fruit cocktail  Noodles or pasta (no rice) Pudding, mousse, custard  Cooked vegetables (no frozen peas, corn, or mixed vegetables) Green salad  Canned small sweet peas  Ice cream  Creamed soup or vegetable soup Fruit ice, Italian ice  Pureed vegetable soup or alphabet soup Non-dairy creamer  Ground scalloped apples Margarine  Gravies Mayonnaise  Sauces:  Cheese, creamed, barbecue, tomato, white Ketchup  Coffee or tea Mustard   SAMPLE MENU:  LEVEL 3 Breakfast Lunch Dinner   Orange juice, 1/2 cup  Oatmeal, 1/2 cup  Scrambled eggs with cheese, 1/2 cup  Decaffeinated tea, 1 cup  Whole milk, 1 cup  Non-dairy creamer, 2 Tbsp  Ketchup, 1 Tbsp  Margarine, 1 tsp  Salt, 1/4 tsp  Sugar, 2 tsp  Pineapple juice, 1/2 cup  Ground beef, 3 oz  Gravy, 2 Tbsp  Mashed potatoes, 1/2 cup  Cooked spinach, 1/2 cup  Applesauce, 1/2 cup  Decaffeinated coffee  Whole milk  Non-dairy creamer, 2 Tbsp  Margarine, 1 tsp  Salt, 1/4 tsp  Pureed turkey, barley soup, 3/4 cup  Barbecue chicken, 3 oz  Mashed potatoes, 1/2 cup  Ground fresh broccoli, 1/2 cup  Frozen yogurt, 1/2 cup  Decaffeinated tea, 1 cup  Non-dairy creamer, 2 Tbsp  Margarine, 1 tsp  Salt, 1/4 tsp  Sugar, 1 tsp    LEVEL 4:  REGULAR FOODS  -Foods in this group are soft, moist, regularly textured foods.   -This level includes meat and breads, which tend to be the hardest things to swallow.   -Eat very slowly, chew well and continue to avoid carbonated drinks. -most people are at this level in 4-6 weeks  Hot Foods Cold Foods  Baked fish or skinned Soft cheeses - cottage cheese  Souffles and omelets Cream cheese  Eggs Yogurt  Stuffed shells Milk  Spaghetti with meat sauce Milkshakes  Cooked cereal Cold dry cereals (no nuts, dried fruit, coconut)  French toast or pancakes Crackers  Buttered toast Fruit juices or nectars  Noodles or pasta (no rice) Canned fruit  Potatoes (all types) Ripe bananas  Soft, cooked vegetables (no corn, lima, or baked beans) Peeled, ripe, fresh fruit  Creamed soups or vegetable soup Cakes (no nuts, dried fruit, coconut)  Canned chicken  noodle soup Plain doughnuts  Gravies Ice cream  Bacon dressing Pudding, mousse, custard  Sauces:  Cheese, creamed, barbecue, tomato, white Fruit ice, Italian ice, sherbet  Decaffeinated tea or coffee Whipped gelatin  Pork chops Regular gelatin     Canned fruited gelatin molds   Sugar, syrup, honey, jam, jelly   Cream   Non-dairy   Margarine   Oil   Mayonnaise   Ketchup   Mustard   TROUBLESHOOTING IRREGULAR BOWELS  1) Avoid extremes of bowel movements (no bad constipation/diarrhea)  2) Miralax 17gm mixed in 8oz. water or juice-daily. May use BID as needed.  3) Gas-x,Phazyme, etc. as needed for gas & bloating.  4) Soft,bland diet. No spicy,greasy,fried foods.  5) Prilosec over-the-counter as needed  6) May hold gluten/wheat products from diet to see if symptoms improve.  7) May try probiotics (Align, Activa, etc) to help calm the bowels down  7) If symptoms become worse call back immediately.    If you have any questions please call our office at [facility]: [phone].  .........   Managing Your Pain After Surgery Without Opioids    Thank you for participating in our program to help patients manage their pain after surgery without opioids. This is part of our effort to provide you with the best care possible, without exposing you or your family to the risk that opioids pose.  What pain can I expect after surgery? You can expect to have some pain after surgery. This is normal. The pain is typically worse the day after surgery, and quickly begins to get better. Many studies have found that many patients are able to manage their pain after surgery with Over-the-Counter (OTC) medications such as Tylenol and Motrin. If you have a condition that does not allow you to take Tylenol or Motrin, notify your surgical team.  How will I manage my pain? The best strategy for controlling your pain after surgery is around the clock pain control with Tylenol (acetaminophen) and  Motrin (ibuprofen or Advil). Alternating these medications with each other allows you to maximize your pain control. In addition to Tylenol and Motrin, you can use heating pads or ice packs on your incisions to help reduce your pain.  How will I alternate your regular strength over-the-counter pain medication? You will take a dose of pain medication every three hours. ; Start by taking 650 mg of Tylenol (2 pills of 325 mg) ; 3 hours later take 600 mg of Motrin (3 pills of 200 mg) ; 3 hours after taking the Motrin take 650 mg of Tylenol ; 3 hours after that take 600 mg of Motrin.   - 1 -  See example - if your first dose of Tylenol is at 12:00 PM   12:00 PM Tylenol 650 mg (2 pills of 325 mg)  3:00 PM Motrin 600 mg (3 pills of 200 mg)  6:00 PM Tylenol 650 mg (2 pills of 325 mg)  9:00 PM Motrin 600 mg (3 pills of 200 mg)  Continue alternating every 3 hours   We recommend that you follow this schedule around-the-clock for at least 3 days after surgery, or until you feel that it is no longer needed. Use the table on the last page of this handout to keep track of the medications you are taking. Important: Do not take more than 3000mg of Tylenol or 1800mg of Motrin in a 24-hour period. Do not take ibuprofen/Motrin if you have a history of bleeding stomach ulcers, severe kidney disease, &/or actively taking a blood thinner  What if I still have pain? If you have pain that is not controlled with the over-the-counter pain medications (Tylenol and Motrin or Advil) you might have what we call "breakthrough" pain. You will   receive a prescription for a small amount of an opioid pain medication such as Oxycodone, Tramadol, or Tylenol with Codeine. Use these opioid pills in the first 24 hours after surgery if you have breakthrough pain. Do not take more than 1 pill every 4-6 hours.  If you still have uncontrolled pain after using all opioid pills, don't hesitate to call our staff using the number  provided. We will help make sure you are managing your pain in the best way possible, and if necessary, we can provide a prescription for additional pain medication.   Day 1    Time  Name of Medication Number of pills taken  Amount of Acetaminophen  Pain Level   Comments  AM PM       AM PM       AM PM       AM PM       AM PM       AM PM       AM PM       AM PM       Total Daily amount of Acetaminophen Do not take more than  3,000 mg per day      Day 2    Time  Name of Medication Number of pills taken  Amount of Acetaminophen  Pain Level   Comments  AM PM       AM PM       AM PM       AM PM       AM PM       AM PM       AM PM       AM PM       Total Daily amount of Acetaminophen Do not take more than  3,000 mg per day      Day 3    Time  Name of Medication Number of pills taken  Amount of Acetaminophen  Pain Level   Comments  AM PM       AM PM       AM PM       AM PM          AM PM       AM PM       AM PM       AM PM       Total Daily amount of Acetaminophen Do not take more than  3,000 mg per day      Day 4    Time  Name of Medication Number of pills taken  Amount of Acetaminophen  Pain Level   Comments  AM PM       AM PM       AM PM       AM PM       AM PM       AM PM       AM PM       AM PM       Total Daily amount of Acetaminophen Do not take more than  3,000 mg per day      Day 5    Time  Name of Medication Number of pills taken  Amount of Acetaminophen  Pain Level   Comments  AM PM       AM PM       AM PM       AM PM       AM PM         AM PM       AM PM       AM PM       Total Daily amount of Acetaminophen Do not take more than  3,000 mg per day       Day 6    Time  Name of Medication Number of pills taken  Amount of Acetaminophen  Pain Level  Comments  AM PM       AM PM       AM PM       AM PM       AM PM       AM PM       AM PM       AM PM       Total Daily amount of Acetaminophen Do  not take more than  3,000 mg per day      Day 7    Time  Name of Medication Number of pills taken  Amount of Acetaminophen  Pain Level   Comments  AM PM       AM PM       AM PM       AM PM       AM PM       AM PM       AM PM       AM PM       Total Daily amount of Acetaminophen Do not take more than  3,000 mg per day        For additional information about how and where to safely dispose of unused opioid medications - https://www.morepowerfulnc.org  Disclaimer: This document contains information and/or instructional materials adapted from [facility] for the typical patient with your condition. It does not replace medical advice from your health care provider because your experience may differ from that of the typical patient. Talk to your health care provider if you have any questions about this document, your condition or your treatment plan. Adapted from [facility]   

## 2019-09-05 NOTE — Op Note (Signed)
09/05/2019  1:11 PM  PATIENT:  Pamela Novak  48 y.o. female  PRE-OPERATIVE DIAGNOSIS:  CHRONIC GERD, possible hiatal hernia, bilateral proximal thigh soft tissue masses  POST-OPERATIVE DIAGNOSIS:  CHRONIC GERD, small sliding hiatal hernia, bilateral proximal thigh lipomas  PROCEDURE:  Procedure(s): LAPAROSCOPIC HIATAL HERNIA REPAIR WITH NISSEN FUNDOPLICATION EXCISION BILATERAL SOFT TISSUE MASSES, THIGH (2x1 cm, 3x 2 cm)  Laparoscopic bilateral tap block  SURGEON:  Surgeon(s): [name], MD  ASSISTANTS: Alphonsa Overall MD  ANESTHESIA:   general  DRAINS: none   Findings: Patient had a small sliding hiatal hernia.  Diaphragm defect was repaired with 1 suture.  2-1/2 cm wrap over a 56 French bougie  LOCAL MEDICATIONS USED:  BUPIVICAINE  and OTHER exparel  SPECIMEN:  Source of Specimen:  bilateral thigh lipomas  DISPOSITION OF SPECIMEN:  PATHOLOGY  COUNTS:  YES  INDICATION FOR PROCEDURE: 48 year old female referred for surgical management of chronic GERD.  Medications were losing effectiveness and she was concerned about long-term ramifications of reflux medications.  She also had bilateral proximal thigh soft tissue masses consistent with lipomas that she requested excision.  Please see outside chart for additional details regarding indications for surgery.  PROCEDURE: Patient was given oral Tylenol and gabapentin preoperatively along with 5000 units of subcutaneous heparin.  She was then taken to the OR 1 at [facility] and placed supine on the operating room table.  General endotracheal anesthesia was established.  Sequential compression devices were placed.  Her arms were tucked at her side with the appropriate padding.  Her abdomen was prepped and draped in the usual standard surgical fashion.  IV antibiotic was administered.  Surgical timeout was performed.  Access to the abdomen was gained via the Optiview technique in the left upper quadrant.  Just below the left  subcostal margin in the midclavicular line a small incision was made.  Then using a 5 mm trocar with a 5 mm laparoscope it was advanced through all layers of the abdominal wall into the abdominal cavity was entered.  There is no evidence of injury to surrounding structures.  Pneumoperitoneum was smoothly established.  No change in patient vital signs.  The abdominal cavity was surveilled.  Patient was placed in steep reverse Trendelenburg. 5mm  Camera port was placed just to the left of the umbilicus, 5 mm trocar in the lateral right abdominal wall, 11 mm trocar in the right midabdomen, 5 mm trocar in the left upper lateral quadrant, and a Nathanson liver retractor placed through the subxiphoid position.  All trochars were placed under direct visualization.  A bilateral bupivacaine Exparel tap block was performed along bilateral lateral abdominal walls as a tap block.  There were some adhesions from the undersurface of the left lobe of liver to the stomach these were taken down with harmonic scalpel.  We were then able to lift up the left lobe of the liver with the [facility] liver retractor to expose the cardia and diaphragm.  There was no gross dimple or defect visible.  Her upper GI suggested a small sliding hiatal hernia.  The gastrohepatic ligament was incised with harmonic scalpel.  The right crura of the diaphragm was identified.  She had an enlarged caudate lobe but it was not fatty.  The peritoneum just medial to the right crus of the diaphragm was incised with harmonic scalpel.  Using gentle blunt dissection I was able to dissect and identify the left crus of the diaphragm.  We then turned our attention to  taking down short gastric vessels along the greater curvature the stomach.  This was done in sequential fashion with harmonic scalpel starting along the greater curve in the mid body of the stomach.  This was taken all the way up to the left crus of the diaphragm.  The angle of Hiss was taken down as  well.  The left crus of the diaphragm was readily identified.  We then were able to pass a Penrose drain retrogastric so that we could lift up the distal esophagus and cardia.  This allowed better visualization of the junction of the right and left crura.  There was a small gap probably of about 2-1/2 cm.  I was able to dissected into the mediastinum.  Some posterior avascular attachments were taken down with harmonic scalpel.  The patient had a fairly decent amount of intra-abdominal esophageal length to begin with.  It was measured at 2.5 cm.  The GE junction was below the diaphragm.  We did not feel that we needed to do any additional mobilization of the esophagus in the mediastinum.  As mentioned before the diaphragmatic defect was not that large.  It was repaired with a single 0 Ethibond Endo Stitch secured with a titanium tie knot.  This left a little bit of a gap between the closure and the esophagus.  At this point I grabbed the upper fundus of the stomach and brought it retrogastric.  It easily passed retrogastric I was then able to perform a shoeshine maneuver.  At this point we had the anesthesia attending passed a lighted tapered 56 French bougie down the oropharynx, esophagus and into the stomach under direct visualization.  I then performed a Nissen fundoplication.  The first suture was a 2-0 Ethibond Endo Stitch incorporating the left side of the wrap with a small bite of esophagus and a portion of the retrogastric wrap on the right side.  This is secured with a titanium tie knot.  I then placed an additional stitch this 1 of a 2-0 Ethibond on SH needle more superiorly also incorporating a small bite of esophagus.  The suture was also secured with a titanium tie knot.  The suture was just below the hiatus.  I then placed a third final 2-0 Ethibond suture inferior to the first placed Endo Stitch.  This created a 2-1/2 cm wrap.  The bougie was removed.  There is no evidence of injury to the distal  stomach.  The wrap was slightly loose.  It did not appear overly tight.  Additional bupivacaine Exparel mixture was infiltrated in the subxiphoid region.  The [facility] liver retractor was removed.  The 11 mm trocar site was closed with a 0 Vicryl and the PMI suture passer under direct visualization.  Trochars were removed after pneumoperitoneum was released.  Skin incisions were closed with a 4 Monocryl in a subcuticular fashion followed by the application of benzoin, Steri-Strips and bandages.  We then turned our attention to the second procedure.  The bilateral proximal thighs were prepped and draped in the usual standard surgical fashion with ChloraPrep.  I had outlined each of the palpable soft tissue masses in the holding area with the patient confirming the site.  I made a vertical incision directly over the midportion of the left mid thigh soft tissue mass with a 15 blade.  Deep dermis was divided.  I was then able to remove a 2 cm by 1 cm lipoma.  Hemostasis is achieved.  Local was infiltrated.  Skin  incision was closed with a 4 Monocryl in a subcuticular fashion.  I then went to the patient's right thigh.  A vertical incision was made directly over the palpable mass.  Again it was easily extracted.  This wound was approximately 3 x 2 cm.  Hemostasis was again achieved with electrocautery.  Local demonstrated.  Incision was closed with a 4 Monocryl in a subcuticular fashion.  Dermabond was applied to both these skin incisions.  All needle, instrument sponge counts were correct x2.  There were no immediate complications.  Patient was extubated and taken to the recovery room in stable condition  PLAN OF CARE: Admit for overnight observation  PATIENT DISPOSITION:  PACU - hemodynamically stable.   Delay start of Pharmacological VTE agent (>24hrs) due to surgical blood loss or risk of bleeding:  no  [name]. [name], MD, FACS General, Bariatric, & Minimally Invasive Surgery [facility],  [place]

## 2019-09-05 NOTE — Anesthesia Postprocedure Evaluation (Signed)
Anesthesia Post Note  Patient: Pamela Novak  Procedure(s) Performed: LAPAROSCOPIC HIATAL HERNIA REPAIR AND NISSEN FUNDOPLICATION (N/A Abdomen) EXCISION BILATERAL SOFT TISSUE MASSES, THIGH (Bilateral Thigh)     Patient location during evaluation: PACU Anesthesia Type: General Level of consciousness: awake and alert Pain management: pain level controlled Vital Signs Assessment: post-procedure vital signs reviewed and stable Respiratory status: spontaneous breathing, nonlabored ventilation, respiratory function stable and patient connected to nasal cannula oxygen Cardiovascular status: blood pressure returned to baseline and stable Postop Assessment: no apparent nausea or vomiting Anesthetic complications: no    Last Vitals:  Vitals:   09/05/19 1315 09/05/19 1330  BP: (!) 134/96 (!) 130/95  Pulse: 96 96  Resp: 12 15  Temp: 36.5 C   SpO2: 100% 99%    Last Pain:  Vitals:   09/05/19 1330  TempSrc:   PainSc: 0-No pain                 Barnet Glasgow

## 2019-09-05 NOTE — Interval H&P Note (Signed)
History and Physical Interval Note:  09/05/2019 9:38 AM  Pamela Novak  has presented today for surgery, with the diagnosis of CHRONIC GERD.  The various methods of treatment have been discussed with the patient and family. After consideration of risks, benefits and other options for treatment, the patient has consented to  Procedure(s): LAPAROSCOPIC NISSEN FUNDOPLICATION (N/A) UPPER GI ENDOSCOPY (N/A) POSSIBLE HERNIA REPAIR HIATAL (N/A) EXCISION BILATERAL SOFT TISSUE MASSES, THIGH (Bilateral) as a surgical intervention.  The patient's history has been reviewed, patient examined, no change in status, stable for surgery.  I have reviewed the patient's chart and labs.  Questions were answered to the patient's satisfaction.    [name]. [name], MD, FACS General, Bariatric, & Minimally Invasive Surgery [facility] [place]  [name]

## 2019-09-05 NOTE — Transfer of Care (Signed)
Immediate Anesthesia Transfer of Care Note  Patient: Pamela Novak  Procedure(s) Performed: LAPAROSCOPIC HIATAL HERNIA REPAIR AND NISSEN FUNDOPLICATION (N/A Abdomen) EXCISION BILATERAL SOFT TISSUE MASSES, THIGH (Bilateral Thigh)  Patient Location: PACU  Anesthesia Type:General  Level of Consciousness: awake, sedated and patient cooperative  Airway & Oxygen Therapy: Patient Spontanous Breathing and Patient connected to face mask oxygen  Post-op Assessment: Report given to RN and Post -op Vital signs reviewed and stable  Post vital signs: stable  Last Vitals:  Vitals Value Taken Time  BP 134/96 09/05/19 1315  Temp    Pulse 92 09/05/19 1316  Resp 9 09/05/19 1316  SpO2 100 % 09/05/19 1316  Vitals shown include unvalidated device data.  Last Pain:  Vitals:   09/05/19 0909  TempSrc:   PainSc: 5       Patients Stated Pain Goal: 4 ([phone] [phone])  Complications: No apparent anesthesia complications

## 2019-09-05 NOTE — H&P (Signed)
Darryll Capers Documented: 08/28/2019 11:06 AM Location: [facility] Patient #: [id number] DOB: 02-Dec-1970 Married / Language: English / Race: White Female   History of Present Illness Randall Hiss M. Cade Dashner MD; 08/28/2019 12:21 PM) The patient is a 48 year old female who presents with a complaint of Mass. I recently met the patient regarding her hiatal hernia and reflux and she is currently scheduled for hiatal hernia repair with fundoplication next week. She called in afterwords inquiring about whether or not we could remove 2 lumps one on her right thigh and one her left thigh. The one in her right thigh has been bothering her especially when wearing shorts. She states that both of been there for quite some time. They have slowly gotten larger over the years. She denies any drainage or redness to the area.   Problem List/Past Medical Randall Hiss M. [name], MD; 08/28/2019 12:24 PM) PROLAPSED INTERNAL HEMORRHOIDS, GRADE 2 (K64.1)  MASS OF SOFT TISSUE OF THIGH (M79.89)  GERD WITH ESOPHAGITIS (K21.00)   Past Surgical History Randall Hiss M. [name], MD; 08/28/2019 12:24 PM) Colon Polyp Removal - Colonoscopy  Oral Surgery   Diagnostic Studies History Randall Hiss M. [name], MD; 08/28/2019 12:24 PM) Colonoscopy  within last year Mammogram  within last year Pap Smear  1-5 years ago  Allergies (Tanisha A. Owens Shark, [place]; 08/28/2019 11:06 AM) No Known Drug Allergies [11/13/2018]: Allergies Reconciled   Medication History (Tanisha A. Owens Shark, [place]; 08/28/2019 11:07 AM) Dexilant (60MG Capsule DR, Oral) Active. Nortriptyline HCl (10MG Capsule, Oral) Active. Amitiza (8MCG Capsule, Oral as needed) Active. Atorvastatin Calcium (20MG Tablet, Oral) Active. Cyclobenzaprine HCl (5MG Tablet, Oral as needed) Active. Gabapentin (100MG Capsule, Oral) Active. Pantoprazole Sodium (40MG Tablet DR, Oral) Active. raNITIdine HCl (150MG Tablet, Oral) Active. DULoxetine HCl (60MG Capsule DR Part, Oral)  Active. buPROPion HCl ER (SR) (150MG Tablet ER 12HR, Oral) Active. Medications Reconciled  Social History Randall Hiss M. [name], MD; 08/28/2019 12:24 PM) Alcohol use  Remotely quit alcohol use. Caffeine use  Tea. No drug use  Tobacco use  Never smoker.  Family History Randall Hiss M. [name], MD; 08/28/2019 12:24 PM) Alcohol Abuse  Father. Arthritis  Mother, Sister. Bleeding disorder  Mother, Sister. Colon Polyps  Father, Sister. Depression  Mother, Sister. Diabetes Mellitus  Sister. Heart Disease  Mother. Heart disease in female family member before age 71  Heart disease in female family member before age 32  Hypertension  Father, Mother, Sister. Seizure disorder  Mother. Thyroid problems  Sister.  Pregnancy / Birth History Randall Hiss M. [name], MD; 08/28/2019 12:24 PM) Age at menarche  65 years. Gravida  2 Irregular periods  Length (months) of breastfeeding  3-6 Maternal age  97-25 Para  2  Other Problems Randall Hiss M. [name], MD; 08/28/2019 12:24 PM) Back Pain  Depression  Diverticulosis  Gastroesophageal Reflux Disease  Hemorrhoids  Hypercholesterolemia  Migraine Headache  Other disease, cancer, significant illness  ANXIETY AND DEPRESSION (F41.9, F32.9)     Review of Systems Randall Hiss M. Jaythen Hamme MD; 08/28/2019 12:21 PM) All other systems negative  Vitals (Tanisha A. Brown RMA; 08/28/2019 11:06 AM) 08/28/2019 11:06 AM Weight: 186 lb Height: 67in Body Surface Area: 1.96 m Body Mass Index: 29.13 kg/m  Temp.: 97.22F  Pulse: 107 (Regular)  BP: 128/84 (Sitting, Left Arm, Standard)       Physical Exam Randall Hiss M. Donnelle Olmeda MD; 08/28/2019 12:23 PM) General Mental Status-Alert. General Appearance-Consistent with stated age. Hydration-Well hydrated. Voice-Normal.  Integumentary Note: Proximal right anterior thigh is a soft tissue mass, well circumscribed, soft, nontender,  no overlying skin lesions. Approximately 2 x 1.5 cm.; Left  anterior thigh slightly more distal is also a well-circumscribed soft tissue mass measuring about 2 x 2 centimeters. Nontender. No overlying skin changes. Both are mobile. Both consistent with lipoma   Chest and Lung Exam Chest and lung exam reveals -quiet, even and easy respiratory effort with no use of accessory muscles. Inspection Chest Wall - Normal. Back - normal.  Lymphatic Note: No inguinal lymphadenopathy     Assessment & Plan Randall Hiss M. Kellan Raffield MD; 08/28/2019 12:24 PM) GERD WITH ESOPHAGITIS (K21.00) Impression: We reviewed her workup for her GERD. We discussed ongoing medical management versus surgical management. We discussed the potential issues with long-term medical management. We discussed laparoscopic Nissen fundoplication with possible hiatal hernia repair. There is no obvious hiatal hernia on her endoscopy but on reviewing his CT scan there is perhaps this tiny sliding hiatal hernia therefore recommended getting an upper GI in the interim. We discussed laparoscopic fundoplication. We discussed risk and benefits including but not limited to bleeding, infection, injury to surrounding structures such as spleen, liver, esophagus, lungs, aorta, intestines; perioperative cardiac and pulmonary events such as blood clots or MI or irregular heartbeats, pneumonia. We discussed wound infection. We discussed the typical recovery. We discussed the need in altering her diet and medially after surgery starting with clear liquids then full liquids and a pured diet. We discussed the need to change eating techniques in order to minimize discomfort and pain with eating. We discussed the possibility of failure to migrate all her symptoms. We also discussed the inability to generally vomit after the surgery as well as gas bloating. We also discussed the lynx procedure which we do not offer. She is interested in proceeding with laparoscopic fundoplication. Her husband has had the surgery about 7 years  ago and is doing quite well. MASS OF SOFT TISSUE OF THIGH (M79.89) Impression: Both of these are consistent with benign soft tissue masses probable lipomas over epidermoid inclusion cyst. I think these can be removed during her hiatal hernia repair next week. We discussed risk and benefits of this separate procedure including but not limited to bleeding, infection, seroma, hematoma, recurrence and scarring. All of her questions were asked and answered. I told her that we are trying to move her surgery to Tuesday as opposed to Thursday but it depended on the OR availability. She had no concerns with moving it up.  [name]. [name], MD, FACS General, Bariatric, & Minimally Invasive Surgery [facility], [place]

## 2019-09-05 NOTE — Anesthesia Procedure Notes (Signed)
Procedure Name: Intubation Date/Time: 09/05/2019 11:01 AM Performed by: Victoriano Lain, CRNA Pre-anesthesia Checklist: Patient identified, Emergency Drugs available, Suction available, Patient being monitored and Timeout performed Patient Re-evaluated:Patient Re-evaluated prior to induction Oxygen Delivery Method: Circle system utilized Preoxygenation: Pre-oxygenation with 100% oxygen Induction Type: IV induction, Rapid sequence and Cricoid Pressure applied Laryngoscope Size: Mac and 4 Grade View: Grade I Tube type: Oral Tube size: 7.0 mm Number of attempts: 1 Airway Equipment and Method: Stylet Placement Confirmation: ETT inserted through vocal cords under direct vision,  positive ETCO2 and breath sounds checked- equal and bilateral Secured at: 21 cm Tube secured with: Tape Dental Injury: Teeth and Oropharynx as per pre-operative assessment

## 2019-09-05 NOTE — Interval H&P Note (Signed)
History and Physical Interval Note:  09/05/2019 9:41 AM  Darryll Capers  has presented today for surgery, with the diagnosis of CHRONIC GERD.  The various methods of treatment have been discussed with the patient and family. After consideration of risks, benefits and other options for treatment, the patient has consented to  Procedure(s): LAPAROSCOPIC NISSEN FUNDOPLICATION (N/A) UPPER GI ENDOSCOPY (N/A) POSSIBLE HERNIA REPAIR HIATAL (N/A) EXCISION BILATERAL SOFT TISSUE MASSES, THIGH (Bilateral) as a surgical intervention.  The patient's history has been reviewed, patient examined, no change in status, stable for surgery.  I have reviewed the patient's chart and labs.  Questions were answered to the patient's satisfaction.      GI history: The patient is a 47 year old female who presents with gastroesophageal reflux disease. She is referred by Dr Silverio Decamp to discuss laparoscopic Nissen fundoplication for chronic gerd. Patient reports long-term history with GERD symptoms. Her primary symptoms initially started out to be sore throat and hoarse voice and was initially thought to be due to asthma. She underwent treatment for asthma for a few years until 1 Dr. recommended a trial of reflux medication and her symptoms improved. She was initially managed with Protonix in the morning and Zantac at night but she would still have sore throats in the morning and episodes of hoarseness. She would also have discomfort or burning sensation in her chest. She was switched to Doxil as a few months ago it worked well initially but became refractory to her symptoms and then they increased the dosage and now it works great she says however she is concerned about the monthly costs. If she doesn't take it she will have recurrence of her symptoms almost within 24 hours. She is also concerned about the potential long-term side effects of reflux medication. Her husband has had a Nissen fundoplication about 7 years ago  and has done well. She occasionally will have a sticking sensation with solid food about twice a week. She does not regurgitate or vomit on a frequent basis. She does have chronic constipation issues and has intermittent hemorrhoidal flares. She generally averages a bowel movement every 3 days. She had an upper endoscopy in November 2018 that showed LA grade a esophagitis and gastritis. H. pylori biopsy was negative. Gastric biopsy was negative for any signs of cancer precancer. She has also had manometry which demonstrated no evidence of dysmotility. She also had a 24-hour pH probe test that showed good acid suppression but elevated nonacid reflux suggestive of esophageal hypersensitivity. She is only had a diagnostic laparoscopy in the past. She denies any chest pain, chest pressure, source of breath, TIAs or amaurosis fugax. She denies any personal blood clots. She does have issues with anxiety and PTSD and depression. She does not smoke [name]

## 2019-09-05 NOTE — Plan of Care (Signed)

## 2019-09-06 ENCOUNTER — Encounter (HOSPITAL_COMMUNITY): Payer: Self-pay | Admitting: General Surgery

## 2019-09-06 ENCOUNTER — Observation Stay (HOSPITAL_COMMUNITY): Payer: Federal, State, Local not specified - PPO

## 2019-09-06 DIAGNOSIS — D1723 Benign lipomatous neoplasm of skin and subcutaneous tissue of right leg: Secondary | ICD-10-CM | POA: Diagnosis not present

## 2019-09-06 DIAGNOSIS — K449 Diaphragmatic hernia without obstruction or gangrene: Secondary | ICD-10-CM | POA: Diagnosis not present

## 2019-09-06 DIAGNOSIS — F329 Major depressive disorder, single episode, unspecified: Secondary | ICD-10-CM | POA: Diagnosis not present

## 2019-09-06 DIAGNOSIS — J45909 Unspecified asthma, uncomplicated: Secondary | ICD-10-CM | POA: Diagnosis not present

## 2019-09-06 DIAGNOSIS — K2100 Gastro-esophageal reflux disease with esophagitis, without bleeding: Secondary | ICD-10-CM | POA: Diagnosis not present

## 2019-09-06 DIAGNOSIS — F419 Anxiety disorder, unspecified: Secondary | ICD-10-CM | POA: Diagnosis not present

## 2019-09-06 DIAGNOSIS — F4310 Post-traumatic stress disorder, unspecified: Secondary | ICD-10-CM | POA: Diagnosis not present

## 2019-09-06 DIAGNOSIS — Z9889 Other specified postprocedural states: Secondary | ICD-10-CM | POA: Diagnosis not present

## 2019-09-06 DIAGNOSIS — E7800 Pure hypercholesterolemia, unspecified: Secondary | ICD-10-CM | POA: Diagnosis not present

## 2019-09-06 DIAGNOSIS — M1990 Unspecified osteoarthritis, unspecified site: Secondary | ICD-10-CM | POA: Diagnosis not present

## 2019-09-06 DIAGNOSIS — Z79899 Other long term (current) drug therapy: Secondary | ICD-10-CM | POA: Diagnosis not present

## 2019-09-06 DIAGNOSIS — D1724 Benign lipomatous neoplasm of skin and subcutaneous tissue of left leg: Secondary | ICD-10-CM | POA: Diagnosis not present

## 2019-09-06 LAB — BASIC METABOLIC PANEL
Anion gap: 7 (ref 5–15)
BUN: 8 mg/dL (ref 6–20)
CO2: 26 mmol/L (ref 22–32)
Calcium: 8.6 mg/dL — ABNORMAL LOW (ref 8.9–10.3)
Chloride: 106 mmol/L (ref 98–111)
Creatinine, Ser: 0.99 mg/dL (ref 0.44–1.00)
GFR calc Af Amer: >60 mL/min (ref >60)
GFR calc non Af Amer: >60 mL/min (ref >60)
Glucose, Bld: 160 mg/dL — ABNORMAL HIGH (ref 70–99)
Potassium: 4.7 mmol/L (ref 3.5–5.1)
Sodium: 139 mmol/L (ref 135–145)

## 2019-09-06 LAB — CBC
HCT: 38.2 % (ref 36.0–46.0)
Hemoglobin: 12.2 g/dL (ref 12.0–15.0)
MCH: 29.3 pg (ref 26.0–34.0)
MCHC: 31.9 g/dL (ref 30.0–36.0)
MCV: 91.8 fL (ref 80.0–100.0)
Platelets: 212 10*3/uL (ref 150–400)
RBC: 4.16 MIL/uL (ref 3.87–5.11)
RDW: 12.8 % (ref 11.5–15.5)
WBC: 13.7 10*3/uL — ABNORMAL HIGH (ref 4.0–10.5)
nRBC: 0 % (ref 0.0–0.2)

## 2019-09-06 LAB — SURGICAL PATHOLOGY

## 2019-09-06 MED ORDER — ACETAMINOPHEN 500 MG PO TABS: 1000.0000 mg | ORAL_TABLET | Freq: Three times a day (TID) | ORAL | 0 refills | Status: AC

## 2019-09-06 MED ORDER — OXYCODONE HCL 5 MG PO TABS: 5.0000 mg | ORAL_TABLET | Freq: Four times a day (QID) | ORAL | 0 refills | Status: DC | PRN

## 2019-09-06 MED ORDER — CYCLOBENZAPRINE HCL 5 MG PO TABS: ORAL_TABLET | ORAL | 0 refills | Status: DC

## 2019-09-06 MED ORDER — ONDANSETRON HCL 4 MG/2ML IJ SOLN
4.0000 mg | Freq: Four times a day (QID) | INTRAMUSCULAR | Status: DC | PRN
  Administered 2019-09-06: 4 mg via INTRAVENOUS
  Filled 2019-09-06: qty 2

## 2019-09-06 MED ORDER — IOHEXOL 350 MG/ML SOLN
50.0000 mL | Freq: Once | INTRAVENOUS | Status: AC | PRN
  Administered 2019-09-06: 50 mL via ORAL

## 2019-09-06 MED ORDER — ONDANSETRON 4 MG PO TBDP: 4.0000 mg | ORAL_TABLET | Freq: Four times a day (QID) | ORAL | Status: DC | PRN

## 2019-09-06 MED ORDER — ONDANSETRON HCL 4 MG PO TABS: 4.0000 mg | ORAL_TABLET | Freq: Three times a day (TID) | ORAL | 0 refills | Status: DC | PRN

## 2019-09-06 NOTE — Progress Notes (Signed)
Discharge instructions discussed with patient and spouse, verbalized agreement and understanding

## 2019-09-06 NOTE — Progress Notes (Signed)
1 Day Post-Op   Subjective/Chief Complaint: Liquids going well Had some neck/shoulder pain "Internalized" her pain last pm, didn't realize pain meds were prn and had to ask for them; pain is better Ambulated 4x Awaiting AM anxiety meds   Objective: Vital signs in last 24 hours: Temp:  [97.7 F (36.5 C)-98.7 F (37.1 C)] 98.5 F (36.9 C) (11/06 0617) Pulse Rate:  [89-120] 91 (11/06 0617) Resp:  [12-21] 18 (11/06 0617) BP: (106-134)/(66-96) 124/79 (11/06 0617) SpO2:  [93 %-100 %] 99 % (11/06 0617) Last BM Date: 09/02/19  Intake/Output from previous day: 11/05 0701 - 11/06 0700 In: 3571.4 [P.O.:1617; I.V.:1954.4] Out: 3550 [Urine:3500; Blood:50] Intake/Output this shift: No intake/output data recorded.  A little anxious (baseline), nontoxic cta Reg Soft, mild approp TTP, incisions ok No edema  Lab Results:  Recent Labs    09/06/19 0257  WBC 13.7*  HGB 12.2  HCT 38.2  PLT 212   BMET Recent Labs    09/06/19 0257  NA 139  K 4.7  CL 106  CO2 26  GLUCOSE 160*  BUN 8  CREATININE 0.99  CALCIUM 8.6*   PT/INR No results for input(s): LABPROT, INR in the last 72 hours. ABG No results for input(s): PHART, HCO3 in the last 72 hours.  Invalid input(s): PCO2, PO2  Studies/Results: No results found.  Anti-infectives: Anti-infectives (From admission, onward)   Start     Dose/Rate Route Frequency Ordered Stop   09/05/19 0830  cefoTEtan (CEFOTAN) 2 g in sodium chloride 0.9 % 100 mL IVPB     2 g 200 mL/hr over 30 Minutes Intravenous On call to O.R. 09/05/19 0826 09/05/19 1133      Assessment/Plan: s/p Procedure(s): LAPAROSCOPIC HIATAL HERNIA REPAIR AND NISSEN FUNDOPLICATION (N/A) EXCISION BILATERAL SOFT TISSUE MASSES, THIGH (Bilateral)  Doing well No fever. No tachycardia. Tolerating liquids. Will get UGI to document anatomy Discussed diet progression over next few weeks Extensive discussion about dc instructions.  Discussed pain control at home.   anticipate dc after UGI Asked for 2 week note for work  [name] [name], MD, FACS General, Bariatric, & Minimally Invasive Surgery [facility], [place]   LOS: 0 days    Pamela Novak 09/06/2019

## 2019-09-09 NOTE — Discharge Summary (Signed)
Physician Discharge Summary  Pamela Novak [id number] DOB: [birth year] DOA: 09/05/2019  PCP: [name], MD  Admit date: 09/05/2019 Discharge date: 09/06/2019  Recommendations for Outpatient Follow-up:    Follow-up Information    Pamela Pickerel, MD. Go on 10/04/2019.   Specialty: General Surgery Why: 9:30 am, pls arrive at 9:15 am Contact information: [address] [name] [address] [phone]          Discharge Diagnoses:  1. GERD 2. PTSD/anxiety 3. B/l thigh lipomas  Surgical Procedure: LAPAROSCOPIC HIATAL HERNIA REPAIR WITH NISSEN FUNDOPLICATION EXCISION BILATERAL SOFT TISSUE MASSES, THIGH (2x1 cm, 3x 2 cm) 09/05/2019  Discharge Condition: good Disposition: home  Diet recommendation: full liquids  Filed Weights   09/05/19 0900  Weight: 82.6 kg    History of present illness: The patient is a 48 year old female who presents with gastroesophageal reflux disease. She is referred by Dr Silverio Decamp to discuss laparoscopic Nissen fundoplication for chronic gerd. Patient reports long-term history with GERD symptoms. Her primary symptoms initially started out to be sore throat and hoarse voice and was initially thought to be due to asthma. She underwent treatment for asthma for a few years until 1 Dr. recommended a trial of reflux medication and her symptoms improved. She was initially managed with Protonix in the morning and Zantac at night but she would still have sore throats in the morning and episodes of hoarseness. She would also have discomfort or burning sensation in her chest. She was switched to Doxil as a few months ago it worked well initially but became refractory to her symptoms and then they increased the dosage and now it works great she says however she is concerned about the monthly costs. If she doesn't take it she will have recurrence of her symptoms almost within 24 hours. She is also concerned about the potential long-term side effects  of reflux medication. Her husband has had a Nissen fundoplication about 7 years ago and has done well. She occasionally will have a sticking sensation with solid food about twice a week. She does not regurgitate or vomit on a frequent basis. She does have chronic constipation issues and has intermittent hemorrhoidal flares. She generally averages a bowel movement every 3 days. She had an upper endoscopy in November 2018 that showed LA grade a esophagitis and gastritis. H. pylori biopsy was negative. Gastric biopsy was negative for any signs of cancer precancer. She has also had manometry which demonstrated no evidence of dysmotility. She also had a 24-hour pH probe test that showed good acid suppression but elevated nonacid reflux suggestive of esophageal hypersensitivity. She is only had a diagnostic laparoscopy in the past. She denies any chest pain, chest pressure, source of breath, TIAs or amaurosis fugax. She denies any personal blood clots. She does have issues with anxiety and PTSD and depression. She does not smoke   Hospital Course:  She was kept overnight for observation. ERAS protocol was used. Maintained on perioperative chemical vte prophylaxis.  On postop day 1 she was tolerating liquids.  Her pain had improved.  Her vital signs are stable.  She had no fever or tachycardia.  No evidence of leak on the postoperative upper GI.  We rediscussed discharge instructions and postoperative care   Discharge Instructions  Discharge Instructions    Call MD for:   Complete by: As directed    Temperature >101   Call MD for:  hives   Complete by: As directed  Call MD for:  persistant dizziness or light-headedness   Complete by: As directed    Call MD for:  persistant nausea and vomiting   Complete by: As directed    Call MD for:  redness, tenderness, or signs of infection (pain, swelling, redness, odor or green/yellow discharge around incision site)   Complete by: As directed     Call MD for:  severe uncontrolled pain   Complete by: As directed    Diet full liquid   Complete by: As directed    Discharge instructions   Complete by: As directed    See [facility] discharge instructions   Increase activity slowly   Complete by: As directed      Allergies as of 09/06/2019      Reactions   Capsaicin Cough      Nsaids    Stomach pain   Other Other (See Comments)   ANY PEPPER - flu like symptoms    Flagyl [metronidazole] Rash      Medication List    STOP taking these medications   amoxicillin-clavulanate 875-125 MG tablet Commonly known as: AUGMENTIN   HYDROcodone-acetaminophen 5-325 MG tablet Commonly known as: NORCO/VICODIN     TAKE these medications   acetaminophen 500 MG tablet Commonly known as: TYLENOL Take 2 tablets (1,000 mg total) by mouth every 8 (eight) hours for 5 days.   ALPRAZolam 0.25 MG tablet Commonly known as: XANAX Take 0.25 mg by mouth 2 (two) times daily as needed for anxiety or sleep.   atorvastatin 20 MG tablet Commonly known as: LIPITOR TAKE 1 TABLET BY MOUTH EVERY DAY   Auvi-Q 0.3 mg/0.3 mL Soaj injection Generic drug: EPINEPHrine Inject 0.3 mg into the muscle as needed for anaphylaxis.   buPROPion 300 MG 24 hr tablet Commonly known as: WELLBUTRIN XL Take 300 mg by mouth daily.   busPIRone 10 MG tablet Commonly known as: BUSPAR Take 10-15 mg by mouth every morning.   cyclobenzaprine 5 MG tablet Commonly known as: FLEXERIL TAKE 1 TABLET BY MOUTH THREE TIMES A DAY AS NEEDED FOR MUSCLE SPASMS What changed: See the new instructions.   Dexilant 60 MG capsule Generic drug: dexlansoprazole Take 1 capsule (60 mg total) by mouth daily.   DULoxetine 60 MG capsule Commonly known as: CYMBALTA Take 120 mg by mouth daily.   gabapentin 100 MG capsule Commonly known as: NEURONTIN TAKE 1 CAPSULE BY MOUTH AT BEDTIME. INCREASE UP TO 300 MG AT NIGHT IF NEEDED What changed: See the new instructions.   hydrocortisone 2.5 % rectal  cream Commonly known as: ANUSOL-HC Place 1 application rectally 2 (two) times daily. What changed:   when to take this  reasons to take this   hydrocortisone 25 MG suppository Commonly known as: ANUSOL-HC PLACE 1 SUPPOSITORY (25 MG TOTAL) RECTALLY AT BEDTIME. What changed:   when to take this  reasons to take this   ketoconazole 2 % shampoo Commonly known as: NIZORAL Apply 1 application topically 2 (two) times a week.   lubiprostone 8 MCG capsule Commonly known as: AMITIZA Take 8 mcg by mouth daily as needed for constipation.   ondansetron 4 MG tablet Commonly known as: Zofran Take 1 tablet (4 mg total) by mouth every 8 (eight) hours as needed for nausea or vomiting.   oxyCODONE 5 MG immediate release tablet Commonly known as: Oxy IR/ROXICODONE Take 1 tablet (5 mg total) by mouth every 6 (six) hours as needed for severe pain.   polyethylene glycol powder 17 GM/SCOOP powder Commonly  known as: GLYCOLAX/MIRALAX Take half capful daily and titrate based on response to have 1-2 soft bowel movements daily.   SUMAtriptan 20 MG/ACT nasal spray Commonly known as: IMITREX Place 1 spray (20 mg total) into the nose every 2 (two) hours as needed for migraine. May repeat in 2 hrs if headache persists or recurs.   zolmitriptan 5 MG tablet Commonly known as: Zomig Take 1 tablet (5 mg total) by mouth as needed for migraine.      Follow-up Information    Pamela Pickerel, MD. Go on 10/04/2019.   Specialty: General Surgery Why: 9:30 am, pls arrive at 9:15 am Contact information: [address] [place] [address] [phone]            The results of significant diagnostics from this hospitalization (including imaging, microbiology, ancillary and laboratory) are listed below for reference.    Significant Diagnostic Studies: Dg Ugi W Single Cm (sol Or Thin Ba)  Result Date: 09/06/2019 CLINICAL DATA:  Postop from hiatal hernia repair and Nissen fundoplication.  EXAM: UPPER GI SERIES WITH KUB TECHNIQUE: After obtaining a scout radiograph a routine upper GI series was performed using water-soluble Omnipaque and thin barium. FLUOROSCOPY TIME:  Radiation Exposure Index (if provided by the fluoroscopic device): 192.6 mGy Number of Acquired Spot Images: 0 COMPARISON:  None. FINDINGS: Scout radiograph: No evidence of bowel obstruction. Large amount of colonic stool noted. Esophagus: No evidence of esophageal mass or stricture. Motility is within normal limits. No gastroesophageal reflux observed. Stomach: Postop changes are seen from Nissen fundoplication. No evidence of contrast leak or extravasation. No evidence of obstruction. Opacification of an outpouching of the stomach is seen in the fundal region adjacent to the site of Nissen fundoplication. This appears to be due to opacification of portion of the Nissen fundoplication wrap. Remainder of the stomach is normal appearance. Duodenum: No ulcer or other significant abnormality seen involving duodenal bulb or sweep. Other:  None. IMPRESSION: Postop changes from Nissen fundoplication. Contrast opacification of a portion of the fundoplication wrap is demonstrated. Otherwise unremarkable exam. No evidence of postop leak or obstruction. These results were called by telephone at the time of interpretation on 09/06/2019 at 1:58 pm to Dr. Harlow Asa. Electronically Signed   By: Marlaine Hind M.D.   On: 09/06/2019 13:58    Microbiology: Recent Results (from the past 240 hour(s))  Novel Coronavirus, NAA (Hosp order, Send-out to Ref Lab; TAT 18-24 hrs     Status: None   Collection Time: 09/02/19  8:47 AM   Specimen: Nasopharyngeal Swab; Respiratory  Result Value Ref Range Status   SARS-CoV-2, NAA NOT DETECTED NOT DETECTED Final    Comment: (NOTE) This nucleic acid amplification test was developed and its performance characteristics determined by [employer]. Nucleic acid amplification tests include PCR and TMA. This  test has not been FDA cleared or approved. This test has been authorized by FDA under an Emergency Use Authorization (EUA). This test is only authorized for the duration of time the declaration that circumstances exist justifying the authorization of the emergency use of in vitro diagnostic tests for detection of SARS-CoV-2 virus and/or diagnosis of COVID-19 infection under section 564(b)(1) of the Act, 21 U.S.C. [id number]) (1), unless the authorization is terminated or revoked sooner. When diagnostic testing is negative, the possibility of a false negative result should be considered in the context of a patient's recent exposures and the presence of clinical signs and symptoms consistent with COVID-19. An individual without symptoms of  COVID- 19 and who is not shedding SARS-CoV-2 vi rus would expect to have a negative (not detected) result in this assay. Performed At: [facility] [address] [place], [place] [id number] Rush Farmer MD [id number]    [place]  Final    Comment: Performed at [facility], [place] [address] [place], [place] [address]     Labs: Basic Metabolic Panel: Recent Labs  Lab 09/06/19 0257  NA 139  K 4.7  CL 106  CO2 26  GLUCOSE 160*  BUN 8  CREATININE 0.99  CALCIUM 8.6*   Liver Function Tests: No results for input(s): AST, ALT, ALKPHOS, BILITOT, PROT, ALBUMIN in the last 168 hours. No results for input(s): LIPASE, AMYLASE in the last 168 hours. No results for input(s): AMMONIA in the last 168 hours. CBC: Recent Labs  Lab 09/06/19 0257  WBC 13.7*  HGB 12.2  HCT 38.2  MCV 91.8  PLT 212   Cardiac Enzymes: No results for input(s): CKTOTAL, CKMB, CKMBINDEX, TROPONINI in the last 168 hours. BNP: BNP (last 3 results) No results for input(s): BNP in the last 8760 hours.  ProBNP (last 3 results) No results for input(s): PROBNP in the last 8760 hours.  CBG: No results for input(s): GLUCAP in the  last 168 hours.  Active Problems:   S/P Nissen fundoplication (without gastrostomy tube) procedure   Time coordinating discharge: 30 min  Signed:  Gayland Curry, MD [facility], [place] 09/09/2019, 10:10 AM

## 2019-09-10 ENCOUNTER — Encounter: Payer: Self-pay | Admitting: Internal Medicine

## 2019-09-11 MED ORDER — NYSTATIN 100000 UNIT/ML MT SUSP: 5.0000 mL | Freq: Four times a day (QID) | OROMUCOSAL | 0 refills | Status: DC

## 2019-09-17 DIAGNOSIS — F4310 Post-traumatic stress disorder, unspecified: Secondary | ICD-10-CM | POA: Diagnosis not present

## 2019-09-17 DIAGNOSIS — F411 Generalized anxiety disorder: Secondary | ICD-10-CM | POA: Diagnosis not present

## 2019-09-19 DIAGNOSIS — Z6827 Body mass index (BMI) 27.0-27.9, adult: Secondary | ICD-10-CM | POA: Diagnosis not present

## 2019-09-19 DIAGNOSIS — R030 Elevated blood-pressure reading, without diagnosis of hypertension: Secondary | ICD-10-CM | POA: Diagnosis not present

## 2019-09-19 DIAGNOSIS — M461 Sacroiliitis, not elsewhere classified: Secondary | ICD-10-CM | POA: Diagnosis not present

## 2019-09-19 DIAGNOSIS — M533 Sacrococcygeal disorders, not elsewhere classified: Secondary | ICD-10-CM | POA: Diagnosis not present

## 2019-10-02 DIAGNOSIS — F411 Generalized anxiety disorder: Secondary | ICD-10-CM | POA: Diagnosis not present

## 2019-10-02 DIAGNOSIS — F4310 Post-traumatic stress disorder, unspecified: Secondary | ICD-10-CM | POA: Diagnosis not present

## 2019-10-08 ENCOUNTER — Other Ambulatory Visit: Payer: Self-pay

## 2019-10-08 MED ORDER — DEXILANT 30 MG PO CPDR: 30.0000 mg | DELAYED_RELEASE_CAPSULE | Freq: Every day | ORAL | 3 refills | Status: DC

## 2019-10-16 DIAGNOSIS — F411 Generalized anxiety disorder: Secondary | ICD-10-CM | POA: Diagnosis not present

## 2019-10-16 DIAGNOSIS — F4310 Post-traumatic stress disorder, unspecified: Secondary | ICD-10-CM | POA: Diagnosis not present

## 2019-10-29 ENCOUNTER — Encounter: Payer: Self-pay | Admitting: Internal Medicine

## 2019-10-29 ENCOUNTER — Other Ambulatory Visit: Payer: Self-pay | Admitting: Internal Medicine

## 2019-10-29 DIAGNOSIS — B370 Candidal stomatitis: Secondary | ICD-10-CM | POA: Insufficient documentation

## 2019-10-29 MED ORDER — FLUCONAZOLE 100 MG PO TABS: 100.0000 mg | ORAL_TABLET | Freq: Every day | ORAL | 0 refills | Status: AC

## 2019-11-05 DIAGNOSIS — F4310 Post-traumatic stress disorder, unspecified: Secondary | ICD-10-CM | POA: Diagnosis not present

## 2019-11-05 DIAGNOSIS — F411 Generalized anxiety disorder: Secondary | ICD-10-CM | POA: Diagnosis not present

## 2019-11-06 ENCOUNTER — Encounter: Payer: Self-pay | Admitting: Gastroenterology

## 2019-11-06 ENCOUNTER — Ambulatory Visit: Payer: Federal, State, Local not specified - PPO | Admitting: Gastroenterology

## 2019-11-06 VITALS — BP 120/76 | HR 72 | Temp 95.4°F | Ht 67.0 in | Wt 166.0 lb

## 2019-11-06 DIAGNOSIS — K219 Gastro-esophageal reflux disease without esophagitis: Secondary | ICD-10-CM

## 2019-11-06 DIAGNOSIS — K5900 Constipation, unspecified: Secondary | ICD-10-CM | POA: Diagnosis not present

## 2019-11-06 DIAGNOSIS — K581 Irritable bowel syndrome with constipation: Secondary | ICD-10-CM | POA: Diagnosis not present

## 2019-11-06 DIAGNOSIS — R110 Nausea: Secondary | ICD-10-CM

## 2019-11-06 DIAGNOSIS — R12 Heartburn: Secondary | ICD-10-CM | POA: Diagnosis not present

## 2019-11-06 DIAGNOSIS — R0989 Other specified symptoms and signs involving the circulatory and respiratory systems: Secondary | ICD-10-CM

## 2019-11-06 DIAGNOSIS — Z9889 Other specified postprocedural states: Secondary | ICD-10-CM

## 2019-11-06 DIAGNOSIS — R198 Other specified symptoms and signs involving the digestive system and abdomen: Secondary | ICD-10-CM

## 2019-11-06 MED ORDER — ONDANSETRON HCL 4 MG PO TABS: 4.0000 mg | ORAL_TABLET | Freq: Three times a day (TID) | ORAL | 0 refills | Status: DC | PRN

## 2019-11-06 MED ORDER — SUCRALFATE 1 GM/10ML PO SUSP: 1.0000 g | Freq: Three times a day (TID) | ORAL | 2 refills | Status: DC

## 2019-11-06 MED ORDER — LUBIPROSTONE 8 MCG PO CAPS: 8.0000 ug | ORAL_CAPSULE | Freq: Two times a day (BID) | ORAL | 3 refills | Status: DC

## 2019-11-06 NOTE — Patient Instructions (Addendum)
Taper Dexilant as discussed with Dr Silverio Decamp  We will refill your Zofran and send to your pharmacy  Take Amitiza 8 mcg twice daily as needed  We have sent carafate to your pharmacy  Follow up in 3 months  If you are age 51 or older, your body mass index should be between 23-30. Your Body mass index is 26 kg/m. If this is out of the aforementioned range listed, please consider follow up with your Primary Care Provider.  If you are age 84 or younger, your body mass index should be between 19-25. Your Body mass index is 26 kg/m. If this is out of the aformentioned range listed, please consider follow up with your Primary Care Provider.    I appreciate the  opportunity to care for you  Thank You   Harl Bowie , MD

## 2019-11-06 NOTE — Progress Notes (Signed)
Pamela Novak    [id number]    28-Aug-1971  Primary Care Physician:Burns, Claudina Lick, MD  Referring Physician: [name], MD [place],  [facility] [place] [address]   Chief complaint:  Constipation  HPI:  49 year old female with history of chronic GERD s/p Nissen fundoplication here for follow-up visit. She continues to have intermittent globus sensation and hoarseness, feels her throat is dry.  She is still taking Dexilant 30 mg daily.  She feels the surgery has helped her symptoms to a large extent. Upper GI series showed flow of barium through EG junction and no gastroesophageal reflux  Denies any dysphagia, odynophagia, vomiting, abdominal pain, melena or blood per rectum.  She is still doing mostly soft diet and is hesitant to advance diet.   EGD May 10, 2017 LA grade D reflux esophagitis, gastritis and gastric nodule biopsied negative for dysplasia, intestinal metaplasia or malignancy. H. pylori negative. Colonoscopy July 04, 2018:2 sessile serrated adenomatous polyps removed, diverticulosis and hemorrhoids  Outpatient Encounter Medications as of 11/06/2019  Medication Sig  . ALPRAZolam (XANAX) 0.25 MG tablet Take 0.25 mg by mouth 2 (two) times daily as needed for anxiety or sleep.   [name] Kitchen atorvastatin (LIPITOR) 20 MG tablet TAKE 1 TABLET BY MOUTH EVERY DAY (Patient taking differently: Take 20 mg by mouth daily. )  . buPROPion (WELLBUTRIN XL) 300 MG 24 hr tablet Take 300 mg by mouth daily.   . busPIRone (BUSPAR) 10 MG tablet Take 10-15 mg by mouth every morning.  . cyclobenzaprine (FLEXERIL) 5 MG tablet TAKE 1 TABLET BY MOUTH THREE TIMES A DAY AS NEEDED FOR MUSCLE SPASMS  . Dexlansoprazole (DEXILANT) 30 MG capsule Take 1 capsule (30 mg total) by mouth daily.  . DULoxetine (CYMBALTA) 60 MG capsule Take 120 mg by mouth daily.   [name] Kitchen EPINEPHrine (AUVI-Q) 0.3 mg/0.3 mL IJ SOAJ injection Inject 0.3 mg into the muscle as needed for anaphylaxis.   . fluconazole  (DIFLUCAN) 100 MG tablet Take 1 tablet (100 mg total) by mouth daily for 10 days.  [name] Kitchen gabapentin (NEURONTIN) 100 MG capsule TAKE 1 CAPSULE BY MOUTH AT BEDTIME. INCREASE UP TO 300 MG AT NIGHT IF NEEDED (Patient taking differently: Take 100-200 mg by mouth 3 (three) times daily. )  . hydrocortisone (ANUSOL-HC) 2.5 % rectal cream Place 1 application rectally 2 (two) times daily. (Patient taking differently: Place 1 application rectally 2 (two) times daily as needed for hemorrhoids. )  . hydrocortisone (ANUSOL-HC) 25 MG suppository PLACE 1 SUPPOSITORY (25 MG TOTAL) RECTALLY AT BEDTIME. (Patient taking differently: Place 25 mg rectally daily as needed for hemorrhoids. )  . ketoconazole (NIZORAL) 2 % shampoo Apply 1 application topically 2 (two) times a week.  . lubiprostone (AMITIZA) 8 MCG capsule Take 8 mcg by mouth daily as needed for constipation.  . ondansetron (ZOFRAN) 4 MG tablet Take 1 tablet (4 mg total) by mouth every 8 (eight) hours as needed for nausea or vomiting.  [name] Kitchen oxyCODONE (OXY IR/ROXICODONE) 5 MG immediate release tablet Take 1 tablet (5 mg total) by mouth every 6 (six) hours as needed for severe pain.  . polyethylene glycol powder (GLYCOLAX/MIRALAX) 17 GM/SCOOP powder Take half capful daily and titrate based on response to have 1-2 soft bowel movements daily.  . SUMAtriptan (IMITREX) 20 MG/ACT nasal spray Place 1 spray (20 mg total) into the nose every 2 (two) hours as needed for migraine. May repeat in 2 hrs if headache persists  or recurs.  [name] Kitchen zolmitriptan (ZOMIG) 5 MG tablet Take 1 tablet (5 mg total) by mouth as needed for migraine.   No facility-administered encounter medications on file as of 11/06/2019.    Allergies as of 11/06/2019 - Review Complete 11/06/2019  Allergen Reaction Noted  . Capsaicin Cough 01/18/2016  . Nsaids  08/30/2019  . Other Other (See Comments) 09/19/2018  . Flagyl [metronidazole] Rash 09/11/2012    Past Medical History:  Diagnosis Date  . Allergy   .  Anxiety    severe  . Arthritis   . Asthma    secondary to GERD, no current issues  . Blood in stool   . Breast hematoma   . Complication of anesthesia    "woke up during colonoscopy" age 32 years old  . Constipation   . Depression   . Diverticulosis   . GERD (gastroesophageal reflux disease)   . Hemorrhoids   . History of colon polyps   . Hyperlipidemia   . IBS (irritable bowel syndrome)   . Migraines   . Pre-diabetes   . Restless legs syndrome (RLS)   . Sciatica    Right side  . Traumatic hematoma of knee   . Varicose veins of lower extremity    vagina    Past Surgical History:  Procedure Laterality Date  . [place] STUDY N/A 04/01/2019   Procedure: [place] STUDY;  Surgeon: Mauri Pole, MD;  Location: [facility];  Service: Endoscopy;  Laterality: N/A;  . ABLATION ON ENDOMETRIOSIS  10/04/2012   Procedure: ABLATION ON ENDOMETRIOSIS;  Surgeon: Linda Hedges, DO;  Location: [facility];  Service: Gynecology;  Laterality: N/A;  . COLONOSCOPY    . CONDYLOMA EXCISION/FULGURATION  10/04/2012   Procedure: CONDYLOMA REMOVAL;  Surgeon: Linda Hedges, DO;  Location: [facility];  Service: Gynecology;  Laterality: N/A;  . DILITATION & CURRETTAGE/HYSTROSCOPY WITH NOVASURE ABLATION  10/04/2012   Procedure: DILATATION & CURETTAGE/HYSTEROSCOPY WITH NOVASURE ABLATION;  Surgeon: Linda Hedges, DO;  Location: [facility];  Service: Gynecology;  Laterality: N/A;  . ESOPHAGEAL MANOMETRY N/A 04/01/2019   Procedure: ESOPHAGEAL MANOMETRY (EM);  Surgeon: Mauri Pole, MD;  Location: [facility];  Service: Endoscopy;  Laterality: N/A;  . HEMORRHOID BANDING    . LAPAROSCOPIC NISSEN FUNDOPLICATION N/A [id number]   Procedure: LAPAROSCOPIC HIATAL HERNIA REPAIR AND NISSEN FUNDOPLICATION;  Surgeon: [name], MD;  Location: [facility];  Service: General;  Laterality: N/A;  . LAPAROSCOPY  10/04/2012   Procedure: LAPAROSCOPY OPERATIVE;  Surgeon: Linda Hedges, DO;  Location: [facility];  Service: Gynecology;   Laterality: N/A;  . MASS EXCISION Bilateral 09/05/2019   Procedure: EXCISION BILATERAL SOFT TISSUE MASSES, THIGH;  Surgeon: [name], MD;  Location: [facility];  Service: General;  Laterality: Bilateral;  . RADIOFREQUENCY ABLATION NERVES    . UPPER GI ENDOSCOPY      Family History  Problem Relation Age of Onset  . Heart disease Mother   . Arthritis Mother   . Hyperlipidemia Mother   . Hypertension Mother   . Heart attack Mother   . Colon polyps Father   . Hashimoto's thyroiditis Sister   . Diabetes Sister   . Thyroid disease Sister   . Colon cancer Paternal Grandfather   . Thyroid disease Sister     Social History   Socioeconomic History  . Marital status: Married    Spouse name: [name]  . Number of children: 2  . Years of education: 12+  . Highest education level: Not on file  Occupational History  . Occupation: adm Radiation protection practitioner: [employer]  Tobacco Use  . Smoking status: Never Smoker  . Smokeless tobacco: Never Used  Substance and Sexual Activity  . Alcohol use: Yes    Comment: rare  . Drug use: No  . Sexual activity: Yes  Other Topics Concern  . Not on file  Social History Narrative   Regular exercise-no   Caffeine Use-yes   Social Determinants of Health   Financial Resource Strain:   . Difficulty of Paying Living Expenses: Not on file  Food Insecurity:   . Worried About Charity fundraiser in the Last Year: Not on file  . Ran Out of Food in the Last Year: Not on file  Transportation Needs:   . Lack of Transportation (Medical): Not on file  . Lack of Transportation (Non-Medical): Not on file  Physical Activity:   . Days of Exercise per Week: Not on file  . Minutes of Exercise per Session: Not on file  Stress:   . Feeling of Stress : Not on file  Social Connections:   . Frequency of Communication with Friends and Family: Not on file  . Frequency of Social Gatherings with Friends and Family: Not on file  . Attends Religious Services:  Not on file  . Active Member of Clubs or Organizations: Not on file  . Attends Archivist Meetings: Not on file  . Marital Status: Not on file  Intimate Partner Violence:   . Fear of Current or Ex-Partner: Not on file  . Emotionally Abused: Not on file  . Physically Abused: Not on file  . Sexually Abused: Not on file      Review of systems: Review of Systems  Constitutional: Negative for fever and chills.  HENT: Positive for post nasal drip and ringing in ears  Eyes: Negative for blurred vision.  Respiratory: Negative for cough, shortness of breath and wheezing.   Cardiovascular: Negative for chest pain and palpitations.  Gastrointestinal: as per HPI Genitourinary: Negative for dysuria, urgency, frequency and hematuria.  Musculoskeletal: Positive for myalgias, back pain and joint pain.  Skin: Negative for itching and rash.  Neurological: Negative for dizziness, tremors, focal weakness, seizures and loss of consciousness. Positive for headaches Endo/Heme/Allergies: Positive for seasonal allergies.  Psychiatric/Behavioral: Negative for suicidal ideas and hallucinations. Positive for depression and anxiety All other systems reviewed and are negative.   Physical Exam: Vitals:   11/06/19 0930  BP: 120/76  Pulse: 72  Temp: (!) 95.4 F (35.2 C)   Body mass index is 26 kg/m. Gen:      No acute distress HEENT:  EOMI, sclera anicteric Neck:     No masses; no thyromegaly Lungs:    Clear to auscultation bilaterally; normal respiratory effort CV:         Regular rate and rhythm; no murmurs Abd:      + bowel sounds; soft, non-tender; no palpable masses, no distension Ext:    No edema; adequate peripheral perfusion Skin:      Warm and dry; no rash Neuro: alert and oriented x 3 Psych: normal mood and affect  Data Reviewed:  Reviewed labs, radiology imaging, old records and pertinent past GI work up   Assessment and Plan/Recommendations:  49 year old female with  chronic GERD, history of severe erosive esophagitis and esophageal hypersensitivity s/p Nissen fundoplication  She continues to have esophageal hypersensitivity, did not tolerate nortriptyline in the past Will try Carafate 1 g suspension before  meals and at bedtime  We will continue to taper off Dexilant, advised patient to slowly decrease it over the next 2 to 3 weeks. Advance diet as tolerated  IBS with constipation: Increase dietary fiber and water intake Use Amitiza 8 mcg twice daily as needed  Nausea: Intermittent, okay to use Zofran as needed  25 minutes was spent face-to-face with the patient. Greater than 50% of the time used for counseling as well as treatment plan and follow-up. She had multiple questions which were answered to her satisfaction  K. Denzil Magnuson , MD    CC: [name], MD

## 2019-11-07 DIAGNOSIS — Z6826 Body mass index (BMI) 26.0-26.9, adult: Secondary | ICD-10-CM | POA: Diagnosis not present

## 2019-11-07 DIAGNOSIS — R030 Elevated blood-pressure reading, without diagnosis of hypertension: Secondary | ICD-10-CM | POA: Diagnosis not present

## 2019-11-07 DIAGNOSIS — M48061 Spinal stenosis, lumbar region without neurogenic claudication: Secondary | ICD-10-CM | POA: Diagnosis not present

## 2019-11-07 DIAGNOSIS — M533 Sacrococcygeal disorders, not elsewhere classified: Secondary | ICD-10-CM | POA: Diagnosis not present

## 2019-11-12 DIAGNOSIS — M545 Low back pain: Secondary | ICD-10-CM | POA: Diagnosis not present

## 2019-11-12 DIAGNOSIS — M48061 Spinal stenosis, lumbar region without neurogenic claudication: Secondary | ICD-10-CM | POA: Diagnosis not present

## 2019-11-16 NOTE — Patient Instructions (Signed)
  Blood work was ordered.     Medications reviewed and updated.  Changes include :     Your prescription(s) have been submitted to your pharmacy. Please take as directed and contact our office if you believe you are having problem(s) with the medication(s).  A referral was ordered for    Please followup in 6 months   

## 2019-11-16 NOTE — Progress Notes (Signed)
Subjective:    Patient ID: Pamela Novak, female    DOB: 09/20/1971, 49 y.o.   MRN: [id number]  HPI The patient is here for follow up of their chronic medical problems, including prediabetes, hyperlipidemia, migraines, GERD.  She is exercising regularly - recumbent bike.  She is exercising regularly.     Was taking gabapentin for neck pain.        Medications and allergies reviewed with patient and updated if appropriate.  Patient Active Problem List   Diagnosis Date Noted  . Oral candidiasis 10/29/2019  . S/P Nissen fundoplication (without gastrostomy tube) procedure 09/05/2019  . LLQ pain 06/14/2019  . Oral thrush 05/22/2019  . Rib pain 05/22/2019  . Chronic constipation 11/16/2018  . Neck pain 09/21/2018  . Bite, snake 08/27/2018  . Open bite of right thumb without damage to nail 08/27/2018  . Migraine without aura and with status migrainosus, not intractable 04/12/2018  . Bilateral low back pain 03/21/2018  . Lumbar disc herniation 03/21/2018  . Prediabetes 03/23/2017  . Seborrheic dermatitis of scalp 03/23/2017  . Hyperlipidemia 01/21/2016  . Depression 01/18/2016  . PTSD (post-traumatic stress disorder) 01/18/2016  . GERD (gastroesophageal reflux disease) 01/18/2016  . Varicose veins 05/21/2013  . GAD (generalized anxiety disorder) 11/02/2012    Current Outpatient Medications on File Prior to Visit  Medication Sig Dispense Refill  . ALPRAZolam (XANAX) 0.25 MG tablet Take 0.25 mg by mouth 2 (two) times daily as needed for anxiety or sleep.     [name] Kitchen atorvastatin (LIPITOR) 20 MG tablet TAKE 1 TABLET BY MOUTH EVERY DAY (Patient taking differently: Take 20 mg by mouth daily. ) 90 tablet 1  . buPROPion (WELLBUTRIN XL) 300 MG 24 hr tablet Take 300 mg by mouth daily.     . busPIRone (BUSPAR) 10 MG tablet Take 10-15 mg by mouth every morning.    . cyclobenzaprine (FLEXERIL) 5 MG tablet TAKE 1 TABLET BY MOUTH THREE TIMES A DAY AS NEEDED FOR MUSCLE SPASMS 20 tablet 0  .  Dexlansoprazole (DEXILANT) 30 MG capsule Take 1 capsule (30 mg total) by mouth daily. 30 capsule 3  . DULoxetine (CYMBALTA) 60 MG capsule Take 120 mg by mouth daily.     [name] Kitchen EPINEPHrine (AUVI-Q) 0.3 mg/0.3 mL IJ SOAJ injection Inject 0.3 mg into the muscle as needed for anaphylaxis.     [name] Kitchen gabapentin (NEURONTIN) 100 MG capsule TAKE 1 CAPSULE BY MOUTH AT BEDTIME. INCREASE UP TO 300 MG AT NIGHT IF NEEDED (Patient taking differently: Take 100-200 mg by mouth 3 (three) times daily. ) 90 capsule 0  . hydrocortisone (ANUSOL-HC) 2.5 % rectal cream Place 1 application rectally 2 (two) times daily. (Patient taking differently: Place 1 application rectally 2 (two) times daily as needed for hemorrhoids. ) 30 g 1  . hydrocortisone (ANUSOL-HC) 25 MG suppository PLACE 1 SUPPOSITORY (25 MG TOTAL) RECTALLY AT BEDTIME. (Patient taking differently: Place 25 mg rectally daily as needed for hemorrhoids. ) 12 suppository 1  . ketoconazole (NIZORAL) 2 % shampoo Apply 1 application topically 2 (two) times a week. 120 mL 5  . lubiprostone (AMITIZA) 8 MCG capsule Take 1 capsule (8 mcg total) by mouth 2 (two) times daily with a meal. 60 capsule 3  . ondansetron (ZOFRAN) 4 MG tablet Take 1 tablet (4 mg total) by mouth every 8 (eight) hours as needed for nausea or vomiting. 30 tablet 0  . oxyCODONE (OXY IR/ROXICODONE) 5 MG immediate release tablet Take 1 tablet (5 mg total) by  mouth every 6 (six) hours as needed for severe pain. 15 tablet 0  . polyethylene glycol powder (GLYCOLAX/MIRALAX) 17 GM/SCOOP powder Take half capful daily and titrate based on response to have 1-2 soft bowel movements daily. 255 g 1  . sucralfate (CARAFATE) 1 GM/10ML suspension Take 10 mLs (1 g total) by mouth 4 (four) times daily -  with meals and at bedtime. 420 mL 2  . SUMAtriptan (IMITREX) 20 MG/ACT nasal spray Place 1 spray (20 mg total) into the nose every 2 (two) hours as needed for migraine. May repeat in 2 hrs if headache persists or recurs. 1 Inhaler  5  . zolmitriptan (ZOMIG) 5 MG tablet Take 1 tablet (5 mg total) by mouth as needed for migraine. 10 tablet 0   No current facility-administered medications on file prior to visit.    Past Medical History:  Diagnosis Date  . Allergy   . Anxiety    severe  . Arthritis   . Asthma    secondary to GERD, no current issues  . Blood in stool   . Breast hematoma   . Complication of anesthesia    "woke up during colonoscopy" age 66 years old  . Constipation   . Depression   . Diverticulosis   . GERD (gastroesophageal reflux disease)   . Hemorrhoids   . History of colon polyps   . Hyperlipidemia   . IBS (irritable bowel syndrome)   . Migraines   . Pre-diabetes   . Restless legs syndrome (RLS)   . Sciatica    Right side  . Traumatic hematoma of knee   . Varicose veins of lower extremity    vagina    Past Surgical History:  Procedure Laterality Date  . [place] STUDY N/A 04/01/2019   Procedure: [place] STUDY;  Surgeon: Mauri Pole, MD;  Location: [facility];  Service: Endoscopy;  Laterality: N/A;  . ABLATION ON ENDOMETRIOSIS  10/04/2012   Procedure: ABLATION ON ENDOMETRIOSIS;  Surgeon: Linda Hedges, DO;  Location: [facility];  Service: Gynecology;  Laterality: N/A;  . COLONOSCOPY    . CONDYLOMA EXCISION/FULGURATION  10/04/2012   Procedure: CONDYLOMA REMOVAL;  Surgeon: Linda Hedges, DO;  Location: [facility];  Service: Gynecology;  Laterality: N/A;  . DILITATION & CURRETTAGE/HYSTROSCOPY WITH NOVASURE ABLATION  10/04/2012   Procedure: DILATATION & CURETTAGE/HYSTEROSCOPY WITH NOVASURE ABLATION;  Surgeon: Linda Hedges, DO;  Location: [facility];  Service: Gynecology;  Laterality: N/A;  . ESOPHAGEAL MANOMETRY N/A 04/01/2019   Procedure: ESOPHAGEAL MANOMETRY (EM);  Surgeon: Mauri Pole, MD;  [facility];  Service: Endoscopy;  Laterality: N/A;  . HEMORRHOID BANDING    . LAPAROSCOPIC NISSEN FUNDOPLICATION N/A 123456   Procedure: LAPAROSCOPIC HIATAL HERNIA REPAIR AND  NISSEN FUNDOPLICATION;  Surgeon: [name], MD;  Location: [facility];  Service: General;  Laterality: N/A;  . LAPAROSCOPY  10/04/2012   Procedure: LAPAROSCOPY OPERATIVE;  Surgeon: Linda Hedges, DO;  Location: [facility];  Service: Gynecology;  Laterality: N/A;  . MASS EXCISION Bilateral 09/05/2019   Procedure: EXCISION BILATERAL SOFT TISSUE MASSES, THIGH;  Surgeon: [name], MD;  Location: [facility];  Service: General;  Laterality: Bilateral;  . RADIOFREQUENCY ABLATION NERVES    . UPPER GI ENDOSCOPY      Social History   Socioeconomic History  . Marital status: Married    Spouse name: [name]  . Number of children: 2  . Years of education: 12+  . Highest education level: Not on file  Occupational History  .  Occupation: adm Radiation protection practitioner: [employer]  Tobacco Use  . Smoking status: Never Smoker  . Smokeless tobacco: Never Used  Substance and Sexual Activity  . Alcohol use: Yes    Comment: rare  . Drug use: No  . Sexual activity: Yes  Other Topics Concern  . Not on file  Social History Narrative   Regular exercise-no   Caffeine Use-yes   Social Determinants of Health   Financial Resource Strain:   . Difficulty of Paying Living Expenses: Not on file  Food Insecurity:   . Worried About Charity fundraiser in the Last Year: Not on file  . Ran Out of Food in the Last Year: Not on file  Transportation Needs:   . Lack of Transportation (Medical): Not on file  . Lack of Transportation (Non-Medical): Not on file  Physical Activity:   . Days of Exercise per Week: Not on file  . Minutes of Exercise per Session: Not on file  Stress:   . Feeling of Stress : Not on file  Social Connections:   . Frequency of Communication with Friends and Family: Not on file  . Frequency of Social Gatherings with Friends and Family: Not on file  . Attends Religious Services: Not on file  . Active Member of Clubs or Organizations: Not on file  . Attends Archivist Meetings: Not  on file  . Marital Status: Not on file    Family History  Problem Relation Age of Onset  . Heart disease Mother   . Arthritis Mother   . Hyperlipidemia Mother   . Hypertension Mother   . Heart attack Mother   . Colon polyps Father   . Hashimoto's thyroiditis Sister   . Diabetes Sister   . Thyroid disease Sister   . Colon cancer Paternal Grandfather   . Thyroid disease Sister     Review of Systems     Objective:  There were no vitals filed for this visit. BP Readings from Last 3 Encounters:  11/06/19 120/76  09/06/19 124/79  08/30/19 (!) 153/81   Wt Readings from Last 3 Encounters:  11/06/19 166 lb (75.3 kg)  09/05/19 182 lb (82.6 kg)  07/01/19 184 lb 9.6 oz (83.7 kg)   There is no height or weight on file to calculate BMI.   Physical Exam    Constitutional: Appears well-developed and well-nourished. No distress.  HENT:  Head: Normocephalic and atraumatic.  Neck: Neck supple. No tracheal deviation present. No thyromegaly present.  No cervical lymphadenopathy Cardiovascular: Normal rate, regular rhythm and normal heart sounds.   No murmur heard. No carotid bruit .  No edema Pulmonary/Chest: Effort normal and breath sounds normal. No respiratory distress. No has no wheezes. No rales.  Skin: Skin is warm and dry. Not diaphoretic.  Psychiatric: Normal mood and affect. Behavior is normal.      Assessment & Plan:    See Problem List for Assessment and Plan of chronic medical problems.    This visit occurred during the SARS-CoV-2 public health emergency.  Safety protocols were in place, including screening questions prior to the visit, additional usage of staff PPE, and extensive cleaning of exam room while observing appropriate contact time as indicated for disinfecting solutions.    This encounter was created in error - please disregard.

## 2019-11-18 ENCOUNTER — Encounter: Payer: Federal, State, Local not specified - PPO | Admitting: Internal Medicine

## 2019-11-18 ENCOUNTER — Telehealth: Payer: Self-pay

## 2019-11-18 ENCOUNTER — Encounter: Payer: Self-pay | Admitting: Internal Medicine

## 2019-11-18 DIAGNOSIS — F4310 Post-traumatic stress disorder, unspecified: Secondary | ICD-10-CM | POA: Diagnosis not present

## 2019-11-18 DIAGNOSIS — F411 Generalized anxiety disorder: Secondary | ICD-10-CM | POA: Diagnosis not present

## 2019-11-18 NOTE — Patient Instructions (Addendum)
  Blood work was ordered.     Medications reviewed and updated.  Changes include :   None   Your prescription(s) have been submitted to your pharmacy. Please take as directed and contact our office if you believe you are having problem(s) with the medication(s).     Please followup in 6 months   

## 2019-11-18 NOTE — Progress Notes (Signed)
Subjective:    Patient ID: Pamela Novak, female    DOB: December 14, 1970, 49 y.o.   MRN: [id number]  HPI The patient is here for follow up of their chronic medical problems, including prediabetes, hyperlipidemia, migraines, GERD.  She is not exercising regularly.  She has not been as compliant with low sugar diet.  She did have her fundoplication surgery and did lose weight as a result.  He has started to gain weight back and is trying to prevent that.  She has been eating more sugar than she should.   Was taking gabapentin for neck pain and back pain.  She does feel it helps, but it does affect her memory slightly.         Medications and allergies reviewed with patient and updated if appropriate.  Patient Active Problem List   Diagnosis Date Noted  . S/P Nissen fundoplication (without gastrostomy tube) procedure 09/05/2019  . LLQ pain 06/14/2019  . Oral thrush 05/22/2019  . Rib pain 05/22/2019  . Chronic constipation 11/16/2018  . Neck pain 09/21/2018  . Migraine without aura and with status migrainosus, not intractable 04/12/2018  . Bilateral low back pain 03/21/2018  . Lumbar disc herniation 03/21/2018  . Prediabetes 03/23/2017  . Seborrheic dermatitis of scalp 03/23/2017  . Hyperlipidemia 01/21/2016  . Depression 01/18/2016  . PTSD (post-traumatic stress disorder) 01/18/2016  . GERD (gastroesophageal reflux disease) 01/18/2016  . Varicose veins 05/21/2013  . GAD (generalized anxiety disorder) 11/02/2012    Current Outpatient Medications on File Prior to Visit  Medication Sig Dispense Refill  . ALPRAZolam (XANAX) 0.25 MG tablet Take 0.25 mg by mouth 2 (two) times daily as needed for anxiety or sleep.     [name] Kitchen atorvastatin (LIPITOR) 20 MG tablet TAKE 1 TABLET BY MOUTH EVERY DAY (Patient taking differently: Take 20 mg by mouth daily. ) 90 tablet 1  . buPROPion (WELLBUTRIN XL) 300 MG 24 hr tablet Take 300 mg by mouth daily.     . busPIRone (BUSPAR) 10 MG tablet Take 10-15  mg by mouth every morning.    [name] Kitchen Dexlansoprazole (DEXILANT) 30 MG capsule Take 1 capsule (30 mg total) by mouth daily. 30 capsule 3  . DULoxetine (CYMBALTA) 60 MG capsule Take 120 mg by mouth daily.     [name] Kitchen EPINEPHrine (AUVI-Q) 0.3 mg/0.3 mL IJ SOAJ injection Inject 0.3 mg into the muscle as needed for anaphylaxis.     [name] Kitchen gabapentin (NEURONTIN) 100 MG capsule TAKE 1 CAPSULE BY MOUTH AT BEDTIME. INCREASE UP TO 300 MG AT NIGHT IF NEEDED (Patient taking differently: Take 100-200 mg by mouth 3 (three) times daily. ) 90 capsule 0  . hydrocortisone (ANUSOL-HC) 2.5 % rectal cream Place 1 application rectally 2 (two) times daily. (Patient taking differently: Place 1 application rectally 2 (two) times daily as needed for hemorrhoids. ) 30 g 1  . lubiprostone (AMITIZA) 8 MCG capsule Take 1 capsule (8 mcg total) by mouth 2 (two) times daily with a meal. 60 capsule 3  . ondansetron (ZOFRAN) 4 MG tablet Take 1 tablet (4 mg total) by mouth every 8 (eight) hours as needed for nausea or vomiting. 30 tablet 0  . polyethylene glycol powder (GLYCOLAX/MIRALAX) 17 GM/SCOOP powder Take half capful daily and titrate based on response to have 1-2 soft bowel movements daily. 255 g 1  . sucralfate (CARAFATE) 1 GM/10ML suspension Take 10 mLs (1 g total) by mouth 4 (four) times daily -  with meals and at bedtime. 420 mL 2  .  SUMAtriptan (IMITREX) 20 MG/ACT nasal spray Place 1 spray (20 mg total) into the nose every 2 (two) hours as needed for migraine. May repeat in 2 hrs if headache persists or recurs. 1 Inhaler 5  . zolmitriptan (ZOMIG) 5 MG tablet Take 1 tablet (5 mg total) by mouth as needed for migraine. 10 tablet 0   No current facility-administered medications on file prior to visit.    Past Medical History:  Diagnosis Date  . Allergy   . Anxiety    severe  . Arthritis   . Asthma    secondary to GERD, no current issues  . Blood in stool   . Breast hematoma   . Complication of anesthesia    "woke up during  colonoscopy" age 63 years old  . Constipation   . Depression   . Diverticulosis   . GERD (gastroesophageal reflux disease)   . Hemorrhoids   . History of colon polyps   . Hyperlipidemia   . IBS (irritable bowel syndrome)   . Migraines   . Pre-diabetes   . Restless legs syndrome (RLS)   . Sciatica    Right side  . Traumatic hematoma of knee   . Varicose veins of lower extremity    vagina    Past Surgical History:  Procedure Laterality Date  . [place] STUDY N/A 04/01/2019   Procedure: [place] STUDY;  Surgeon: Mauri Pole, MD;  [facility];  Service: Endoscopy;  Laterality: N/A;  . ABLATION ON ENDOMETRIOSIS  10/04/2012   Procedure: ABLATION ON ENDOMETRIOSIS;  Surgeon: Linda Hedges, DO;  Location: [facility];  Service: Gynecology;  Laterality: N/A;  . COLONOSCOPY    . CONDYLOMA EXCISION/FULGURATION  10/04/2012   Procedure: CONDYLOMA REMOVAL;  Surgeon: Linda Hedges, DO;  Location: [facility];  Service: Gynecology;  Laterality: N/A;  . DILITATION & CURRETTAGE/HYSTROSCOPY WITH NOVASURE ABLATION  10/04/2012   Procedure: DILATATION & CURETTAGE/HYSTEROSCOPY WITH NOVASURE ABLATION;  Surgeon: Linda Hedges, DO;  Location: [facility];  Service: Gynecology;  Laterality: N/A;  . ESOPHAGEAL MANOMETRY N/A 04/01/2019   Procedure: ESOPHAGEAL MANOMETRY (EM);  Surgeon: Mauri Pole, MD;  [facility];  Service: Endoscopy;  Laterality: N/A;  . HEMORRHOID BANDING    . LAPAROSCOPIC NISSEN FUNDOPLICATION N/A [id number]   Procedure: LAPAROSCOPIC HIATAL HERNIA REPAIR AND NISSEN FUNDOPLICATION;  Surgeon: [name], MD;  Location: [facility];  Service: General;  Laterality: N/A;  . LAPAROSCOPY  10/04/2012   Procedure: LAPAROSCOPY OPERATIVE;  Surgeon: Linda Hedges, DO;  Location: [facility];  Service: Gynecology;  Laterality: N/A;  . MASS EXCISION Bilateral 09/05/2019   Procedure: EXCISION BILATERAL SOFT TISSUE MASSES, THIGH;  Surgeon: [name], MD;  [facility];  Service: General;   Laterality: Bilateral;  . RADIOFREQUENCY ABLATION NERVES    . UPPER GI ENDOSCOPY      Social History   Socioeconomic History  . Marital status: Married    Spouse name: [name]  . Number of children: 2  . Years of education: 12+  . Highest education level: Not on file  Occupational History  . Occupation: adm Radiation protection practitioner: [employer]  Tobacco Use  . Smoking status: Never Smoker  . Smokeless tobacco: Never Used  Substance and Sexual Activity  . Alcohol use: Yes    Comment: rare  . Drug use: No  . Sexual activity: Yes  Other Topics Concern  . Not on file  Social History Narrative   Regular exercise-no   Caffeine  Use-yes   Social Determinants of Health   Financial Resource Strain:   . Difficulty of Paying Living Expenses: Not on file  Food Insecurity:   . Worried About Charity fundraiser in the Last Year: Not on file  . Ran Out of Food in the Last Year: Not on file  Transportation Needs:   . Lack of Transportation (Medical): Not on file  . Lack of Transportation (Non-Medical): Not on file  Physical Activity:   . Days of Exercise per Week: Not on file  . Minutes of Exercise per Session: Not on file  Stress:   . Feeling of Stress : Not on file  Social Connections:   . Frequency of Communication with Friends and Family: Not on file  . Frequency of Social Gatherings with Friends and Family: Not on file  . Attends Religious Services: Not on file  . Active Member of Clubs or Organizations: Not on file  . Attends Archivist Meetings: Not on file  . Marital Status: Not on file    Family History  Problem Relation Age of Onset  . Heart disease Mother   . Arthritis Mother   . Hyperlipidemia Mother   . Hypertension Mother   . Heart attack Mother   . Colon polyps Father   . Hashimoto's thyroiditis Sister   . Diabetes Sister   . Thyroid disease Sister   . Colon cancer Paternal Grandfather   . Thyroid disease Sister     Review of Systems    Constitutional: Positive for fatigue. Negative for chills and fever.  Respiratory: Negative for cough, shortness of breath and wheezing.   Cardiovascular: Positive for chest pain (related to anxiety, none with exertion). Negative for palpitations and leg swelling.  Neurological: Positive for light-headedness (occasional) and headaches (occ migraines).       Objective:   Vitals:   11/19/19 1028  BP: 120/84  Pulse: 84  Temp: 98.3 F (36.8 C)  SpO2: 99%   BP Readings from Last 3 Encounters:  11/19/19 120/84  11/06/19 120/76  09/06/19 124/79   Wt Readings from Last 3 Encounters:  11/19/19 170 lb (77.1 kg)  11/06/19 166 lb (75.3 kg)  09/05/19 182 lb (82.6 kg)   Body mass index is 26.63 kg/m.   Physical Exam    Constitutional: Appears well-developed and well-nourished. No distress.  HENT:  Head: Normocephalic and atraumatic.  Neck: Neck supple. No tracheal deviation present. No thyromegaly present.  No cervical lymphadenopathy Cardiovascular: Normal rate, regular rhythm and normal heart sounds.  No murmur heard. No carotid bruit .  No edema Pulmonary/Chest: Effort normal and breath sounds normal. No respiratory distress. No has no wheezes. No rales.  Skin: Skin is warm and dry. Not diaphoretic.  Psychiatric: Normal mood and affect. Behavior is normal.      Assessment & Plan:    See Problem List for Assessment and Plan of chronic medical problems.    This visit occurred during the SARS-CoV-2 public health emergency.  Safety protocols were in place, including screening questions prior to the visit, additional usage of staff PPE, and extensive cleaning of exam room while observing appropriate contact time as indicated for disinfecting solutions.

## 2019-11-18 NOTE — Telephone Encounter (Signed)
Pamela Novak Key: [facility] - PA Case ID: [id number] - Rx #[id number] Outcome  Approved today  Your PA request has been approved. Additional information will be provided in the approval communication. (Message 1145)  DrugAmitiza 8MCG capsules

## 2019-11-19 ENCOUNTER — Encounter: Payer: Self-pay | Admitting: Internal Medicine

## 2019-11-19 ENCOUNTER — Ambulatory Visit (INDEPENDENT_AMBULATORY_CARE_PROVIDER_SITE_OTHER): Payer: Federal, State, Local not specified - PPO | Admitting: Internal Medicine

## 2019-11-19 ENCOUNTER — Other Ambulatory Visit: Payer: Self-pay

## 2019-11-19 VITALS — BP 120/84 | HR 84 | Temp 98.3°F | Ht 67.0 in | Wt 170.0 lb

## 2019-11-19 DIAGNOSIS — R7303 Prediabetes: Secondary | ICD-10-CM

## 2019-11-19 DIAGNOSIS — K219 Gastro-esophageal reflux disease without esophagitis: Secondary | ICD-10-CM

## 2019-11-19 DIAGNOSIS — L219 Seborrheic dermatitis, unspecified: Secondary | ICD-10-CM

## 2019-11-19 DIAGNOSIS — G43001 Migraine without aura, not intractable, with status migrainosus: Secondary | ICD-10-CM | POA: Diagnosis not present

## 2019-11-19 DIAGNOSIS — E7849 Other hyperlipidemia: Secondary | ICD-10-CM | POA: Diagnosis not present

## 2019-11-19 DIAGNOSIS — M5126 Other intervertebral disc displacement, lumbar region: Secondary | ICD-10-CM

## 2019-11-19 DIAGNOSIS — M542 Cervicalgia: Secondary | ICD-10-CM

## 2019-11-19 LAB — COMPREHENSIVE METABOLIC PANEL
ALT: 22 U/L (ref 0–35)
AST: 17 U/L (ref 0–37)
Albumin: 4.4 g/dL (ref 3.5–5.2)
Alkaline Phosphatase: 53 U/L (ref 39–117)
BUN: 11 mg/dL (ref 6–23)
CO2: 31 mEq/L (ref 19–32)
Calcium: 9.7 mg/dL (ref 8.4–10.5)
Chloride: 102 mEq/L (ref 96–112)
Creatinine, Ser: 0.99 mg/dL (ref 0.40–1.20)
GFR: 59.33 mL/min — ABNORMAL LOW (ref >60.00)
Glucose, Bld: 105 mg/dL — ABNORMAL HIGH (ref 70–99)
Potassium: 4.1 mEq/L (ref 3.5–5.1)
Sodium: 140 mEq/L (ref 135–145)
Total Bilirubin: 0.4 mg/dL (ref 0.2–1.2)
Total Protein: 6.8 g/dL (ref 6.0–8.3)

## 2019-11-19 LAB — LIPID PANEL
Cholesterol: 185 mg/dL (ref 0–200)
HDL: 50.4 mg/dL (ref >39.00)
LDL Cholesterol: 108 mg/dL — ABNORMAL HIGH (ref 0–99)
NonHDL: 134.31
Total CHOL/HDL Ratio: 4
Triglycerides: 131 mg/dL (ref 0.0–149.0)
VLDL: 26.2 mg/dL (ref 0.0–40.0)

## 2019-11-19 LAB — HEMOGLOBIN A1C: Hgb A1c MFr Bld: 5.8 % (ref 4.6–6.5)

## 2019-11-19 MED ORDER — KETOCONAZOLE 2 % EX SHAM: 1.0000 "application " | MEDICATED_SHAMPOO | CUTANEOUS | 5 refills | Status: DC

## 2019-11-19 MED ORDER — HYDROCORTISONE ACETATE 25 MG RE SUPP: 25.0000 mg | Freq: Every day | RECTAL | 5 refills | Status: DC | PRN

## 2019-11-19 NOTE — Assessment & Plan Note (Signed)
Chronic, controlled Migraines about 1/ month  Uses imitrex spray works fairly well Has zomig pills if needed Continue above

## 2019-11-19 NOTE — Assessment & Plan Note (Signed)
Chronic back pain with radiculopathy down right leg Taking gabapentin, which helps Have a nerve ablation

## 2019-11-19 NOTE — Assessment & Plan Note (Signed)
Chronic Controlled Taking gabapentin, which does help-continue

## 2019-11-19 NOTE — Assessment & Plan Note (Signed)
Chronic Check lipid panel  Continue daily statin Regular exercise and healthy diet encouraged  

## 2019-11-19 NOTE — Assessment & Plan Note (Signed)
Chronic Following with GI S/p fundoplication last year Still on Dexilant, but dose decreased and hopefully will get off Still taking Carafate, which she finds very helpful

## 2019-11-19 NOTE — Assessment & Plan Note (Signed)
Chronic, intermittent Uses ketoconazole shampoo as needed We will continue

## 2019-11-19 NOTE — Assessment & Plan Note (Signed)
Chronic Check a1c Low sugar / carb diet Stressed regular exercise  

## 2019-11-20 ENCOUNTER — Encounter: Payer: Self-pay | Admitting: Internal Medicine

## 2019-11-25 ENCOUNTER — Ambulatory Visit: Payer: Federal, State, Local not specified - PPO | Admitting: Gastroenterology

## 2019-11-28 DIAGNOSIS — Z6826 Body mass index (BMI) 26.0-26.9, adult: Secondary | ICD-10-CM | POA: Diagnosis not present

## 2019-11-28 DIAGNOSIS — R030 Elevated blood-pressure reading, without diagnosis of hypertension: Secondary | ICD-10-CM | POA: Diagnosis not present

## 2019-12-10 DIAGNOSIS — F4310 Post-traumatic stress disorder, unspecified: Secondary | ICD-10-CM | POA: Diagnosis not present

## 2019-12-10 DIAGNOSIS — F411 Generalized anxiety disorder: Secondary | ICD-10-CM | POA: Diagnosis not present

## 2019-12-11 DIAGNOSIS — K5900 Constipation, unspecified: Secondary | ICD-10-CM | POA: Diagnosis not present

## 2019-12-11 DIAGNOSIS — Z09 Encounter for follow-up examination after completed treatment for conditions other than malignant neoplasm: Secondary | ICD-10-CM | POA: Diagnosis not present

## 2019-12-18 DIAGNOSIS — F411 Generalized anxiety disorder: Secondary | ICD-10-CM | POA: Diagnosis not present

## 2019-12-18 DIAGNOSIS — F4310 Post-traumatic stress disorder, unspecified: Secondary | ICD-10-CM | POA: Diagnosis not present

## 2019-12-31 DIAGNOSIS — M47816 Spondylosis without myelopathy or radiculopathy, lumbar region: Secondary | ICD-10-CM | POA: Diagnosis not present

## 2020-01-08 DIAGNOSIS — F411 Generalized anxiety disorder: Secondary | ICD-10-CM | POA: Diagnosis not present

## 2020-01-08 DIAGNOSIS — F4310 Post-traumatic stress disorder, unspecified: Secondary | ICD-10-CM | POA: Diagnosis not present

## 2020-01-09 ENCOUNTER — Other Ambulatory Visit: Payer: Self-pay | Admitting: Internal Medicine

## 2020-01-12 IMAGING — MR MR LUMBAR SPINE W/O CM
4 of 5 series · 23 of 48 positions shown · non-contrast
Comparison: None.

CLINICAL DATA: February 2018 was bending over in shower to wash her feet
and felt a pop, now has centralized LBP that radiates into the right
leg. No numbness, injections or hx of cancer.

EXAM:
MRI LUMBAR SPINE WITHOUT CONTRAST
TECHNIQUE: Multiplanar, multisequence MR imaging of the lumbar spine was
performed. No intravenous contrast was administered.

[Series 2: T2 · sagittal · 4.0mm · 0.44mm/px · 6 of 12 slices shown (1 of 2)]
[im 1/12]
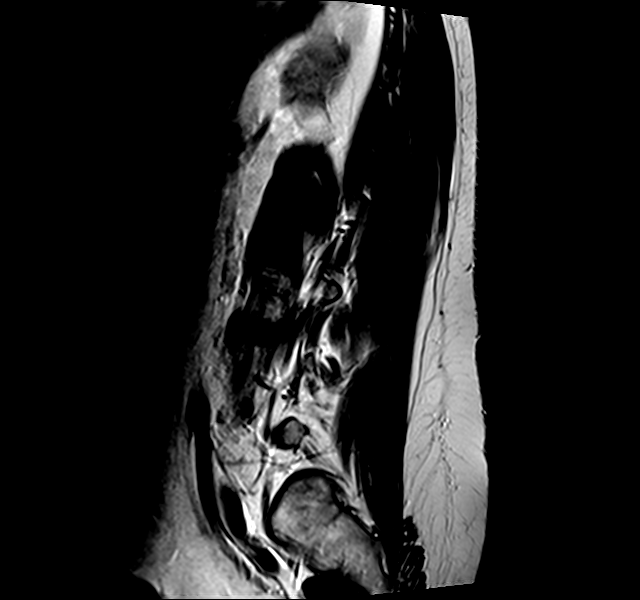
[im 3/12]
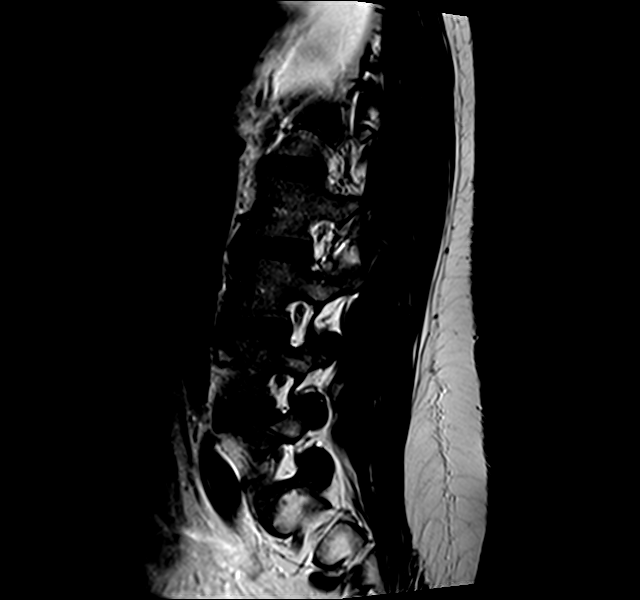
[im 5/12]
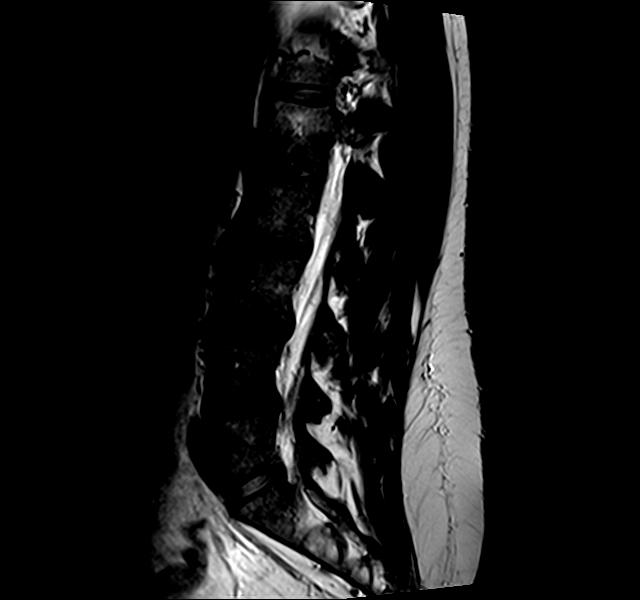
[im 7/12]
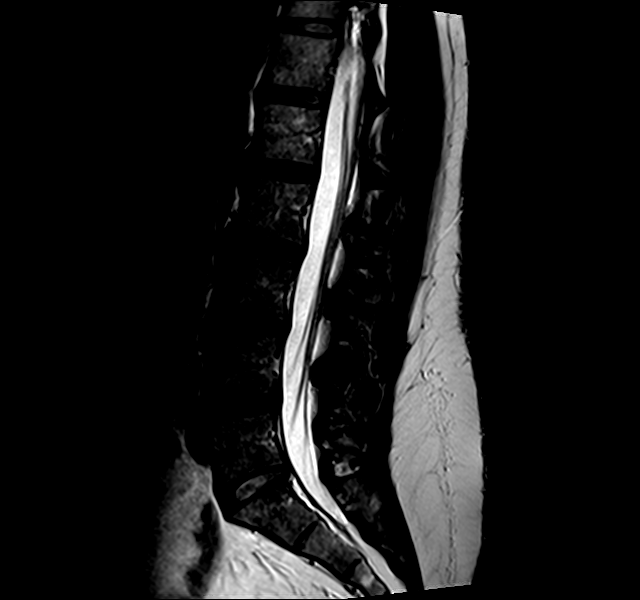
[im 9/12]
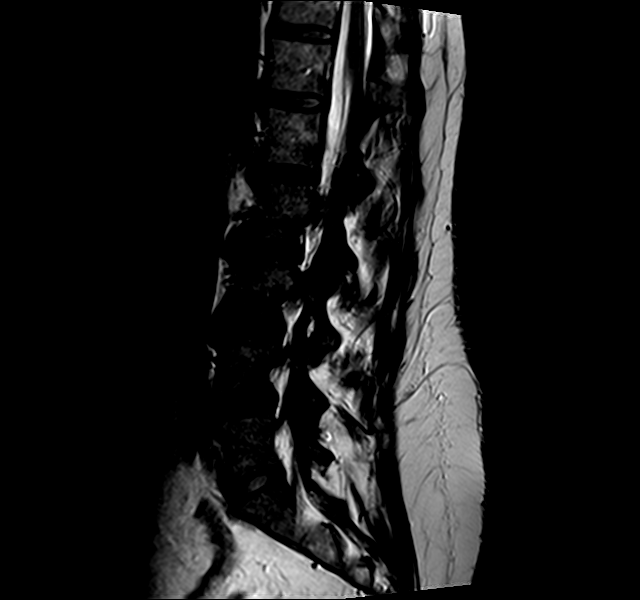
[im 12/12]
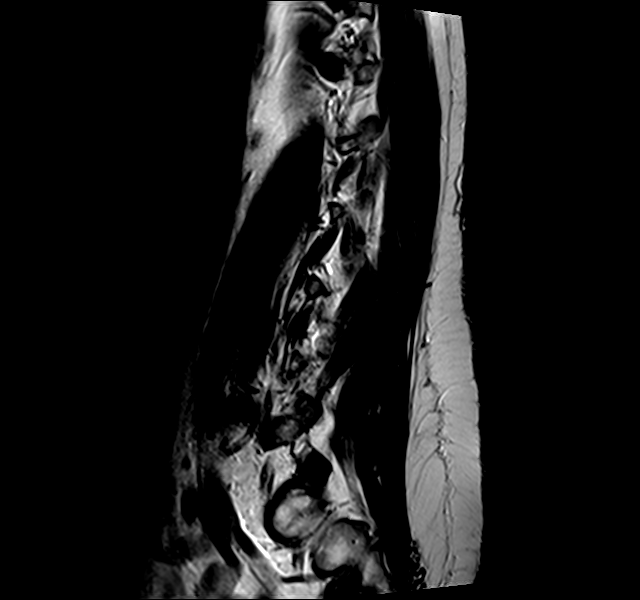

[Series 3: T1 · sagittal · 4.0mm · 0.55mm/px · 5 of 12 slices shown (1 of 2)]
[im 1/12]
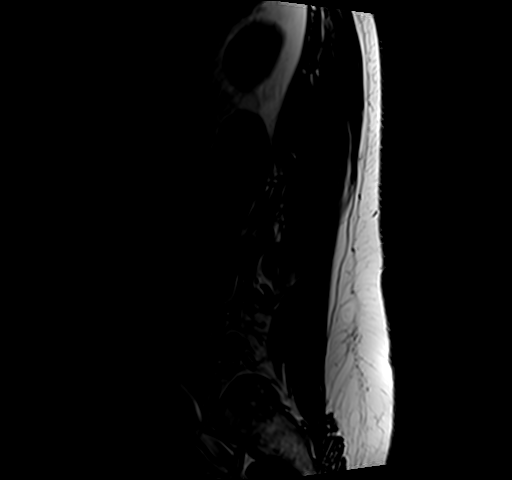
[im 3/12]
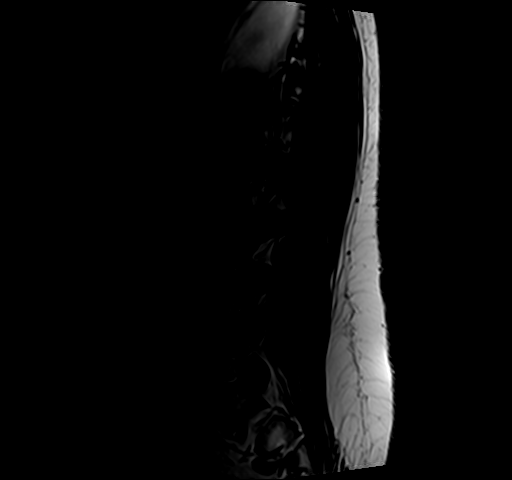
[im 5/12]
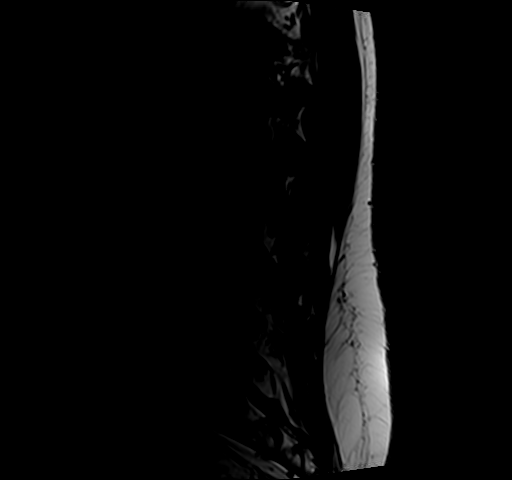
[im 7/12]
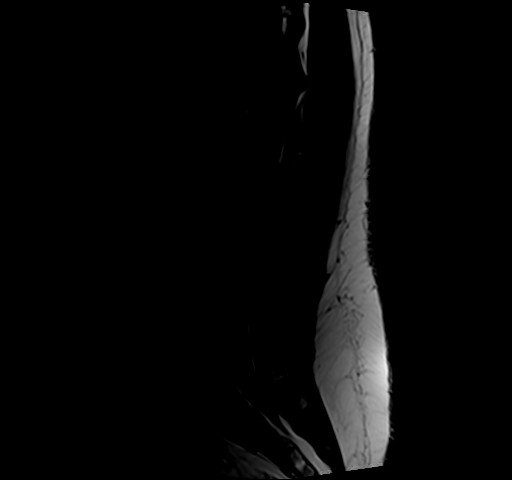
[im 12/12]
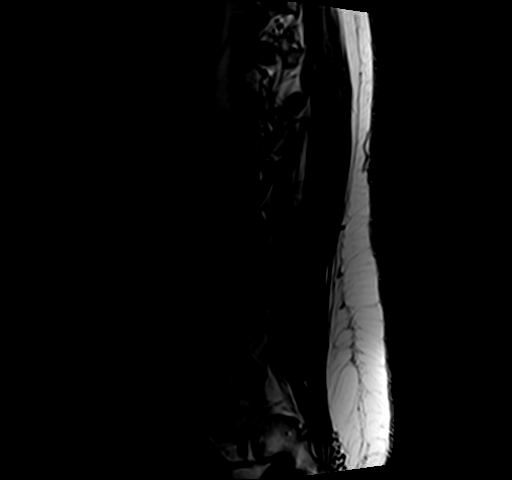

[Series 4: T1 · axial · 4.0mm · 0.39mm/px · z∈[-75,+57]mm · 3 of 30 slices shown (2 of 2)]
[im 5/30]
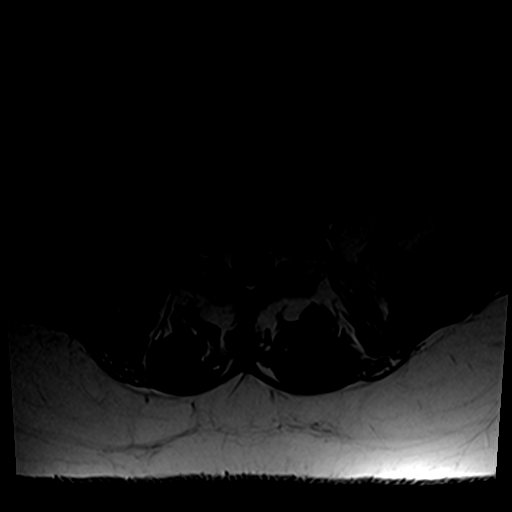
[im 15/30]
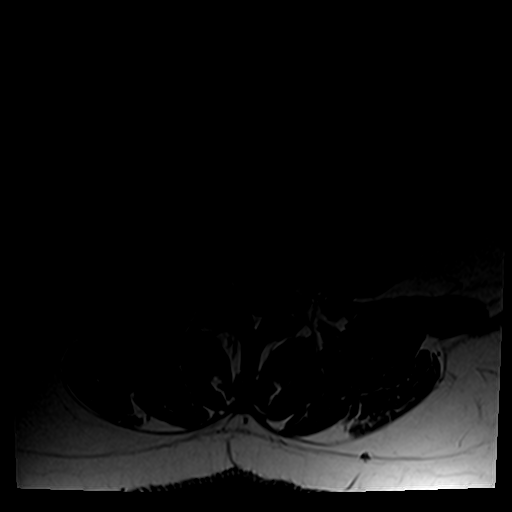
[im 25/30]
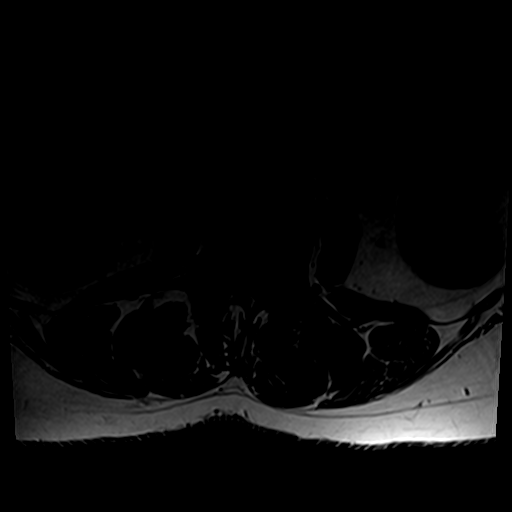

[Series 6: T2 · axial · 4.0mm · 0.78mm/px · z∈[-95,+95]mm · 9 of 30 slices shown (2 of 2)]
[im 1/30]
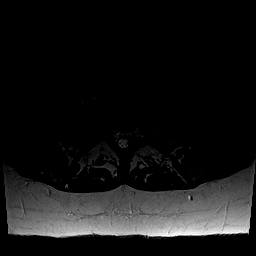
[im 5/30]
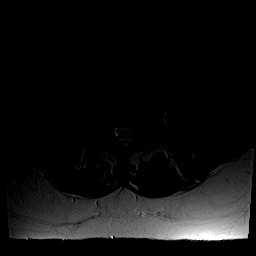
[im 9/30]
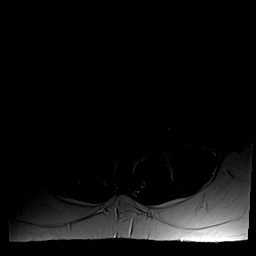
[im 13/30]
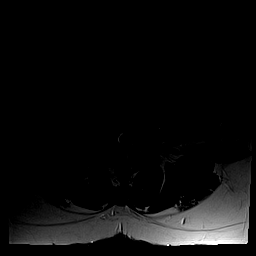
[im 15/30]
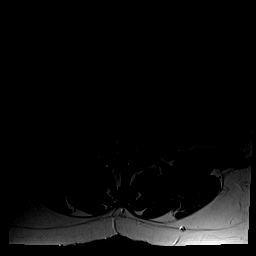
[im 17/30]
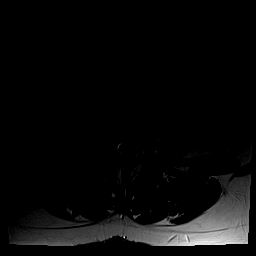
[im 21/30]
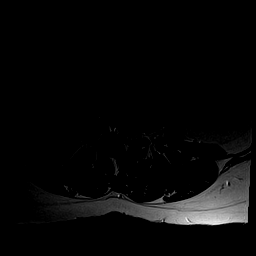
[im 25/30]
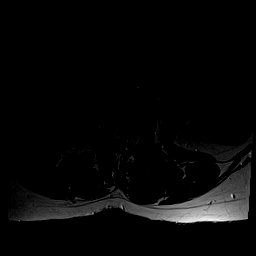
[im 30/30]
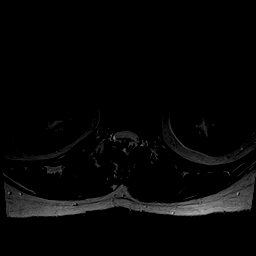

[23 of 48 positions shown; findings below may reference images not displayed]

FINDINGS: Segmentation:  Standard.

Alignment:  Physiologic.

Vertebrae:  No fracture, evidence of discitis, or bone lesion.

Conus medullaris and cauda equina: Conus extends to the T12 level.
Conus and cauda equina appear normal.

Paraspinal and other soft tissues: No acute paraspinal abnormality.

Disc levels:

Disc spaces: Disc desiccation at L2-3, L3-4 and L4-5.

T12-L1: No significant disc bulge. No evidence of neural foraminal
stenosis. No central canal stenosis.

L1-L2: No significant disc bulge. No evidence of neural foraminal
stenosis. No central canal stenosis.

L2-L3: Small lateral disc protrusion. No evidence of neural
foraminal stenosis. No central canal stenosis.

L3-L4: Mild broad-based disc bulge eccentric towards the left. Mild
bilateral facet arthropathy. No evidence of neural foraminal
stenosis. No central canal stenosis.

L4-L5: Mild broad-based disc bulge with a right lateral annular
fissure. No evidence of neural foraminal stenosis. No central canal
stenosis.

L5-S1: No significant disc bulge. No evidence of neural foraminal
stenosis. No central canal stenosis.
IMPRESSION: 1. At L3-4 there is a mild broad-based disc bulge eccentric towards
the left. Mild bilateral facet arthropathy.
2. At L4-5 there is a mild broad-based disc bulge with a right
lateral annular fissure.

## 2020-01-14 DIAGNOSIS — F4310 Post-traumatic stress disorder, unspecified: Secondary | ICD-10-CM | POA: Diagnosis not present

## 2020-01-14 DIAGNOSIS — F411 Generalized anxiety disorder: Secondary | ICD-10-CM | POA: Diagnosis not present

## 2020-01-16 ENCOUNTER — Ambulatory Visit: Payer: Federal, State, Local not specified - PPO | Attending: Internal Medicine

## 2020-01-16 DIAGNOSIS — M47816 Spondylosis without myelopathy or radiculopathy, lumbar region: Secondary | ICD-10-CM | POA: Diagnosis not present

## 2020-01-16 DIAGNOSIS — Z23 Encounter for immunization: Secondary | ICD-10-CM

## 2020-01-16 DIAGNOSIS — M533 Sacrococcygeal disorders, not elsewhere classified: Secondary | ICD-10-CM | POA: Diagnosis not present

## 2020-01-16 DIAGNOSIS — M461 Sacroiliitis, not elsewhere classified: Secondary | ICD-10-CM | POA: Diagnosis not present

## 2020-01-16 NOTE — Progress Notes (Signed)
   Covid-19 Vaccination Clinic  Name:  Pamela Novak    MRN: [id number] DOB: Jun 27, 1971  01/16/2020  Pamela Novak was observed post Covid-19 immunization for 30 minutes based on pre-vaccination screening without incident. She was provided with Vaccine Information Sheet and instruction to access the V-Safe system.   Pamela Novak was instructed to call 911 with any severe reactions post vaccine: [name] Kitchen Difficulty breathing  . Swelling of face and throat  . A fast heartbeat  . A bad rash all over body  . Dizziness and weakness   Immunizations Administered    Name Date Dose VIS Date Route   Pfizer COVID-19 Vaccine 01/16/2020  3:56 PM 0.3 mL 10/11/2019 Intramuscular   Manufacturer: [place]   Lot: [id number]   [place]: [id number]

## 2020-01-31 ENCOUNTER — Other Ambulatory Visit: Payer: Self-pay | Admitting: Gastroenterology

## 2020-02-06 DIAGNOSIS — F4310 Post-traumatic stress disorder, unspecified: Secondary | ICD-10-CM | POA: Diagnosis not present

## 2020-02-06 DIAGNOSIS — F411 Generalized anxiety disorder: Secondary | ICD-10-CM | POA: Diagnosis not present

## 2020-02-06 DIAGNOSIS — F900 Attention-deficit hyperactivity disorder, predominantly inattentive type: Secondary | ICD-10-CM | POA: Diagnosis not present

## 2020-02-10 ENCOUNTER — Ambulatory Visit: Payer: Federal, State, Local not specified - PPO

## 2020-02-11 DIAGNOSIS — F4310 Post-traumatic stress disorder, unspecified: Secondary | ICD-10-CM | POA: Diagnosis not present

## 2020-02-11 DIAGNOSIS — M461 Sacroiliitis, not elsewhere classified: Secondary | ICD-10-CM | POA: Diagnosis not present

## 2020-02-11 DIAGNOSIS — F900 Attention-deficit hyperactivity disorder, predominantly inattentive type: Secondary | ICD-10-CM | POA: Diagnosis not present

## 2020-02-11 DIAGNOSIS — F411 Generalized anxiety disorder: Secondary | ICD-10-CM | POA: Diagnosis not present

## 2020-02-11 DIAGNOSIS — F332 Major depressive disorder, recurrent severe without psychotic features: Secondary | ICD-10-CM | POA: Diagnosis not present

## 2020-02-12 ENCOUNTER — Ambulatory Visit: Payer: Federal, State, Local not specified - PPO | Attending: Internal Medicine

## 2020-02-12 DIAGNOSIS — Z23 Encounter for immunization: Secondary | ICD-10-CM

## 2020-02-12 NOTE — Progress Notes (Signed)
   Covid-19 Vaccination Clinic  Name:  Pamela Novak    MRN: [id number] DOB: 1970-12-15  02/12/2020  [name] was observed post Covid-19 immunization for 30 minutes based on pre-vaccination screening without incident. She was provided with Vaccine Information Sheet and instruction to access the V-Safe system.   [name] was instructed to call 911 with any severe reactions post vaccine: [name] Kitchen Difficulty breathing  . Swelling of face and throat  . A fast heartbeat  . A bad rash all over body  . Dizziness and weakness   Immunizations Administered    Name Date Dose VIS Date Route   Pfizer COVID-19 Vaccine 02/12/2020  3:09 PM 0.3 mL 10/11/2019 Intramuscular   Manufacturer: [place]   Lot: [id number]   [place]: [id number]

## 2020-02-19 ENCOUNTER — Other Ambulatory Visit: Payer: Self-pay | Admitting: Internal Medicine

## 2020-02-24 DIAGNOSIS — F900 Attention-deficit hyperactivity disorder, predominantly inattentive type: Secondary | ICD-10-CM | POA: Diagnosis not present

## 2020-02-24 DIAGNOSIS — F332 Major depressive disorder, recurrent severe without psychotic features: Secondary | ICD-10-CM | POA: Diagnosis not present

## 2020-02-24 DIAGNOSIS — F411 Generalized anxiety disorder: Secondary | ICD-10-CM | POA: Diagnosis not present

## 2020-02-24 DIAGNOSIS — F4310 Post-traumatic stress disorder, unspecified: Secondary | ICD-10-CM | POA: Diagnosis not present

## 2020-02-28 DIAGNOSIS — J3089 Other allergic rhinitis: Secondary | ICD-10-CM | POA: Diagnosis not present

## 2020-02-28 DIAGNOSIS — K219 Gastro-esophageal reflux disease without esophagitis: Secondary | ICD-10-CM | POA: Diagnosis not present

## 2020-02-28 DIAGNOSIS — L509 Urticaria, unspecified: Secondary | ICD-10-CM | POA: Diagnosis not present

## 2020-02-28 DIAGNOSIS — J383 Other diseases of vocal cords: Secondary | ICD-10-CM | POA: Diagnosis not present

## 2020-03-10 DIAGNOSIS — M47816 Spondylosis without myelopathy or radiculopathy, lumbar region: Secondary | ICD-10-CM | POA: Diagnosis not present

## 2020-03-10 DIAGNOSIS — M461 Sacroiliitis, not elsewhere classified: Secondary | ICD-10-CM | POA: Diagnosis not present

## 2020-03-23 DIAGNOSIS — F900 Attention-deficit hyperactivity disorder, predominantly inattentive type: Secondary | ICD-10-CM | POA: Diagnosis not present

## 2020-03-23 DIAGNOSIS — F332 Major depressive disorder, recurrent severe without psychotic features: Secondary | ICD-10-CM | POA: Diagnosis not present

## 2020-03-23 DIAGNOSIS — F4310 Post-traumatic stress disorder, unspecified: Secondary | ICD-10-CM | POA: Diagnosis not present

## 2020-03-23 DIAGNOSIS — F411 Generalized anxiety disorder: Secondary | ICD-10-CM | POA: Diagnosis not present

## 2020-03-24 DIAGNOSIS — F411 Generalized anxiety disorder: Secondary | ICD-10-CM | POA: Diagnosis not present

## 2020-03-24 DIAGNOSIS — F4310 Post-traumatic stress disorder, unspecified: Secondary | ICD-10-CM | POA: Diagnosis not present

## 2020-03-24 DIAGNOSIS — F900 Attention-deficit hyperactivity disorder, predominantly inattentive type: Secondary | ICD-10-CM | POA: Diagnosis not present

## 2020-03-29 ENCOUNTER — Other Ambulatory Visit: Payer: Self-pay | Admitting: Gastroenterology

## 2020-04-21 DIAGNOSIS — F4310 Post-traumatic stress disorder, unspecified: Secondary | ICD-10-CM | POA: Diagnosis not present

## 2020-04-21 DIAGNOSIS — F900 Attention-deficit hyperactivity disorder, predominantly inattentive type: Secondary | ICD-10-CM | POA: Diagnosis not present

## 2020-04-21 DIAGNOSIS — F411 Generalized anxiety disorder: Secondary | ICD-10-CM | POA: Diagnosis not present

## 2020-05-14 DIAGNOSIS — F900 Attention-deficit hyperactivity disorder, predominantly inattentive type: Secondary | ICD-10-CM | POA: Diagnosis not present

## 2020-05-14 DIAGNOSIS — F411 Generalized anxiety disorder: Secondary | ICD-10-CM | POA: Diagnosis not present

## 2020-05-14 DIAGNOSIS — M47816 Spondylosis without myelopathy or radiculopathy, lumbar region: Secondary | ICD-10-CM | POA: Diagnosis not present

## 2020-05-14 DIAGNOSIS — F332 Major depressive disorder, recurrent severe without psychotic features: Secondary | ICD-10-CM | POA: Diagnosis not present

## 2020-05-14 DIAGNOSIS — F4310 Post-traumatic stress disorder, unspecified: Secondary | ICD-10-CM | POA: Diagnosis not present

## 2020-05-18 ENCOUNTER — Encounter: Payer: Federal, State, Local not specified - PPO | Admitting: Internal Medicine

## 2020-05-20 DIAGNOSIS — F4310 Post-traumatic stress disorder, unspecified: Secondary | ICD-10-CM | POA: Diagnosis not present

## 2020-05-20 DIAGNOSIS — F900 Attention-deficit hyperactivity disorder, predominantly inattentive type: Secondary | ICD-10-CM | POA: Diagnosis not present

## 2020-05-20 DIAGNOSIS — F411 Generalized anxiety disorder: Secondary | ICD-10-CM | POA: Diagnosis not present

## 2020-05-20 DIAGNOSIS — F332 Major depressive disorder, recurrent severe without psychotic features: Secondary | ICD-10-CM | POA: Diagnosis not present

## 2020-05-25 NOTE — Patient Instructions (Addendum)
Blood work was ordered.    All other Health Maintenance issues reviewed.   All recommended immunizations and age-appropriate screenings are up-to-date or discussed.  Shingrix immunization administered today.   Medications reviewed and updated.  Changes include :   none    Please followup in 6 months      Health Maintenance, Female Adopting a healthy lifestyle and getting preventive care are important in promoting health and wellness. Ask your health care provider about:  The right schedule for you to have regular tests and exams.  Things you can do on your own to prevent diseases and keep yourself healthy. What should I know about diet, weight, and exercise? Eat a healthy diet   Eat a diet that includes plenty of vegetables, fruits, low-fat dairy products, and lean protein.  Do not eat a lot of foods that are high in solid fats, added sugars, or sodium. Maintain a healthy weight Body mass index (BMI) is used to identify weight problems. It estimates body fat based on height and weight. Your health care provider can help determine your BMI and help you achieve or maintain a healthy weight. Get regular exercise Get regular exercise. This is one of the most important things you can do for your health. Most adults should:  Exercise for at least 150 minutes each week. The exercise should increase your heart rate and make you sweat (moderate-intensity exercise).  Do strengthening exercises at least twice a week. This is in addition to the moderate-intensity exercise.  Spend less time sitting. Even light physical activity can be beneficial. Watch cholesterol and blood lipids Have your blood tested for lipids and cholesterol at 51 years of age, then have this test every 5 years. Have your cholesterol levels checked more often if:  Your lipid or cholesterol levels are high.  You are older than 51 years of age.  You are at high risk for heart disease. What should I know about  cancer screening? Depending on your health history and family history, you may need to have cancer screening at various ages. This may include screening for:  Breast cancer.  Cervical cancer.  Colorectal cancer.  Skin cancer.  Lung cancer. What should I know about heart disease, diabetes, and high blood pressure? Blood pressure and heart disease  High blood pressure causes heart disease and increases the risk of stroke. This is more likely to develop in people who have high blood pressure readings, are of African descent, or are overweight.  Have your blood pressure checked: ? Every 3-5 years if you are 103-75 years of age. ? Every year if you are 39 years old or older. Diabetes Have regular diabetes screenings. This checks your fasting blood sugar level. Have the screening done:  Once every three years after age 36 if you are at a normal weight and have a low risk for diabetes.  More often and at a younger age if you are overweight or have a high risk for diabetes. What should I know about preventing infection? Hepatitis B If you have a higher risk for hepatitis B, you should be screened for this virus. Talk with your health care provider to find out if you are at risk for hepatitis B infection. Hepatitis C Testing is recommended for:  Everyone born from [birth year] through 1967.  Anyone with known risk factors for hepatitis C. Sexually transmitted infections (STIs)  Get screened for STIs, including gonorrhea and chlamydia, if: ? You are sexually active and are younger than 51  years of age. ? You are older than 51 years of age and your health care provider tells you that you are at risk for this type of infection. ? Your sexual activity has changed since you were last screened, and you are at increased risk for chlamydia or gonorrhea. Ask your health care provider if you are at risk.  Ask your health care provider about whether you are at high risk for HIV. Your health care  provider may recommend a prescription medicine to help prevent HIV infection. If you choose to take medicine to prevent HIV, you should first get tested for HIV. You should then be tested every 3 months for as long as you are taking the medicine. Pregnancy  If you are about to stop having your period (premenopausal) and you may become pregnant, seek counseling before you get pregnant.  Take 400 to 800 micrograms (mcg) of folic acid every day if you become pregnant.  Ask for birth control (contraception) if you want to prevent pregnancy. Osteoporosis and menopause Osteoporosis is a disease in which the bones lose minerals and strength with aging. This can result in bone fractures. If you are 39 years old or older, or if you are at risk for osteoporosis and fractures, ask your health care provider if you should:  Be screened for bone loss.  Take a calcium or vitamin D supplement to lower your risk of fractures.  Be given hormone replacement therapy (HRT) to treat symptoms of menopause. Follow these instructions at home: Lifestyle  Do not use any products that contain nicotine or tobacco, such as cigarettes, e-cigarettes, and chewing tobacco. If you need help quitting, ask your health care provider.  Do not use street drugs.  Do not share needles.  Ask your health care provider for help if you need support or information about quitting drugs. Alcohol use  Do not drink alcohol if: ? Your health care provider tells you not to drink. ? You are pregnant, may be pregnant, or are planning to become pregnant.  If you drink alcohol: ? Limit how much you use to 0-1 drink a day. ? Limit intake if you are breastfeeding.  Be aware of how much alcohol is in your drink. In the [place] one drink equals one 12 oz bottle of beer (355 mL), one 5 oz glass of wine (148 mL), or one 1 oz glass of hard liquor (44 mL). General instructions  Schedule regular health, dental, and eye exams.  Stay current  with your vaccines.  Tell your health care provider if: ? You often feel depressed. ? You have ever been abused or do not feel safe at home. Summary  Adopting a healthy lifestyle and getting preventive care are important in promoting health and wellness.  Follow your health care provider's instructions about healthy diet, exercising, and getting tested or screened for diseases.  Follow your health care provider's instructions on monitoring your cholesterol and blood pressure. This information is not intended to replace advice given to you by your health care provider. Make sure you discuss any questions you have with your health care provider. Document Revised: 10/10/2018 Document Reviewed: 10/10/2018 Elsevier Patient Education  2020 [employer].

## 2020-05-25 NOTE — Progress Notes (Signed)
Subjective:    Patient ID: Pamela Novak, female    DOB: September 09, 1971, 48 y.o.   MRN: [id number]  HPI She is here for a physical exam.     She fell forward recently  - left with pain behind R knee.  She had some others pains went away. Fall was last Friday.  She denies right knee swelling.  Pain with bending knee.  She can walk ok.  At night she feels it hurts.  She denies pain with the stairs.    Medications and allergies reviewed with patient and updated if appropriate.  Patient Active Problem List   Diagnosis Date Noted  . S/P Nissen fundoplication (without gastrostomy tube) procedure 09/05/2019  . LLQ pain 06/14/2019  . Rib pain 05/22/2019  . Chronic constipation 11/16/2018  . Neck pain 09/21/2018  . Migraine without aura and with status migrainosus, not intractable 04/12/2018  . Bilateral low back pain 03/21/2018  . Lumbar disc herniation with chronic lower back pain 03/21/2018  . Prediabetes 03/23/2017  . Seborrheic dermatitis of scalp 03/23/2017  . Hyperlipidemia 01/21/2016  . Depression 01/18/2016  . PTSD (post-traumatic stress disorder) 01/18/2016  . GERD (gastroesophageal reflux disease) 01/18/2016  . Varicose veins 05/21/2013  . GAD (generalized anxiety disorder) 11/02/2012    Current Outpatient Medications on File Prior to Visit  Medication Sig Dispense Refill  . ALPRAZolam (XANAX) 0.25 MG tablet Take 0.25 mg by mouth 2 (two) times daily as needed for anxiety or sleep.     [name] Kitchen amphetamine-dextroamphetamine (ADDERALL XR) 30 MG 24 hr capsule Take 30 mg by mouth every morning.    [name] Kitchen amphetamine-dextroamphetamine (ADDERALL) 10 MG tablet SMARTSIG:1-1.5 Tablet(s) By Mouth Twice Daily    . atorvastatin (LIPITOR) 20 MG tablet TAKE 1 TABLET BY MOUTH EVERY DAY 90 tablet 1  . buPROPion (WELLBUTRIN XL) 300 MG 24 hr tablet Take 300 mg by mouth daily.     . busPIRone (BUSPAR) 10 MG tablet Take 10-15 mg by mouth every morning.    . busPIRone (BUSPAR) 15 MG tablet Take 15 mg by  mouth 2 (two) times daily.    [name] Kitchen Dexlansoprazole (DEXILANT) 30 MG capsule Take 1 capsule (30 mg total) by mouth daily. 30 capsule 3  . DULoxetine (CYMBALTA) 60 MG capsule Take 120 mg by mouth daily.     [name] Kitchen EPINEPHrine (AUVI-Q) 0.3 mg/0.3 mL IJ SOAJ injection Inject 0.3 mg into the muscle as needed for anaphylaxis.     [name] Kitchen gabapentin (NEURONTIN) 100 MG capsule TAKE 1 CAPSULE BY MOUTH AT BEDTIME. INCREASE UP TO 300 MG AT NIGHT IF NEEDED (Patient taking differently: Take 100-200 mg by mouth 3 (three) times daily. ) 90 capsule 0  . hydrocortisone (ANUSOL-HC) 2.5 % rectal cream Place 1 application rectally 2 (two) times daily. (Patient taking differently: Place 1 application rectally 2 (two) times daily as needed for hemorrhoids. ) 30 g 1  . hydrocortisone (ANUSOL-HC) 25 MG suppository Place 1 suppository (25 mg total) rectally daily as needed for hemorrhoids. 12 suppository 5  . ketoconazole (NIZORAL) 2 % shampoo APPLY 1 APPLICATION TOPICALLY 2 (TWO) TIMES A WEEK. 120 mL 5  . lubiprostone (AMITIZA) 8 MCG capsule TAKE 1 CAPSULE BY MOUTH 2 (TWO) TIMES DAILY WITH A MEAL. 60 capsule 3  . ondansetron (ZOFRAN) 4 MG tablet Take 1 tablet (4 mg total) by mouth every 8 (eight) hours as needed for nausea or vomiting. 30 tablet 0  . polyethylene glycol powder (GLYCOLAX/MIRALAX) 17 GM/SCOOP powder Take half capful daily  and titrate based on response to have 1-2 soft bowel movements daily. 255 g 1  . sucralfate (CARAFATE) 1 GM/10ML suspension Take 10 mLs (1 g total) by mouth 4 (four) times daily -  with meals and at bedtime. 420 mL 2  . SUMAtriptan (IMITREX) 20 MG/ACT nasal spray Place 1 spray (20 mg total) into the nose every 2 (two) hours as needed for migraine. May repeat in 2 hrs if headache persists or recurs. 1 Inhaler 5  . zolmitriptan (ZOMIG) 5 MG tablet Take 1 tablet (5 mg total) by mouth as needed for migraine. 10 tablet 0   No current facility-administered medications on file prior to visit.    Past Medical  History:  Diagnosis Date  . Allergy   . Anxiety    severe  . Arthritis   . Asthma    secondary to GERD, no current issues  . Blood in stool   . Breast hematoma   . Complication of anesthesia    "woke up during colonoscopy" age 58 years old  . Constipation   . Depression   . Diverticulosis   . GERD (gastroesophageal reflux disease)   . Hemorrhoids   . History of colon polyps   . Hyperlipidemia   . IBS (irritable bowel syndrome)   . Migraines   . Pre-diabetes   . Restless legs syndrome (RLS)   . Sciatica    Right side  . Traumatic hematoma of knee   . Varicose veins of lower extremity    vagina    Past Surgical History:  Procedure Laterality Date  . [place] STUDY N/A 04/01/2019   Procedure: [place] STUDY;  Surgeon: Mauri Pole, MD;  Location: [facility];  Service: Endoscopy;  Laterality: N/A;  . ABLATION ON ENDOMETRIOSIS  10/04/2012   Procedure: ABLATION ON ENDOMETRIOSIS;  Surgeon: Linda Hedges, DO;  Location: [facility];  Service: Gynecology;  Laterality: N/A;  . COLONOSCOPY    . CONDYLOMA EXCISION/FULGURATION  10/04/2012   Procedure: CONDYLOMA REMOVAL;  Surgeon: Linda Hedges, DO;  Location: [facility];  Service: Gynecology;  Laterality: N/A;  . DILITATION & CURRETTAGE/HYSTROSCOPY WITH NOVASURE ABLATION  10/04/2012   Procedure: DILATATION & CURETTAGE/HYSTEROSCOPY WITH NOVASURE ABLATION;  Surgeon: Linda Hedges, DO;  Location: [facility];  Service: Gynecology;  Laterality: N/A;  . ESOPHAGEAL MANOMETRY N/A 04/01/2019   Procedure: ESOPHAGEAL MANOMETRY (EM);  Surgeon: Mauri Pole, MD;  Location: [facility];  Service: Endoscopy;  Laterality: N/A;  . HEMORRHOID BANDING    . LAPAROSCOPIC NISSEN FUNDOPLICATION N/A 62/10/3084   Procedure: LAPAROSCOPIC HIATAL HERNIA REPAIR AND NISSEN FUNDOPLICATION;  Surgeon: [name], MD;  Location: [facility];  Service: General;  Laterality: N/A;  . LAPAROSCOPY  10/04/2012   Procedure: LAPAROSCOPY OPERATIVE;  Surgeon: Linda Hedges, DO;   Location: [facility];  Service: Gynecology;  Laterality: N/A;  . MASS EXCISION Bilateral 09/05/2019   Procedure: EXCISION BILATERAL SOFT TISSUE MASSES, THIGH;  Surgeon: [name], MD;  Location: [facility];  Service: General;  Laterality: Bilateral;  . RADIOFREQUENCY ABLATION NERVES    . UPPER GI ENDOSCOPY      Social History   Socioeconomic History  . Marital status: Married    Spouse name: [name]  . Number of children: 2  . Years of education: 12+  . Highest education level: Not on file  Occupational History  . Occupation: adm Radiation protection practitioner: [employer]  Tobacco Use  . Smoking status: Never Smoker  . Smokeless tobacco: Never  Used  Vaping Use  . Vaping Use: Never used  Substance and Sexual Activity  . Alcohol use: Yes    Comment: rare  . Drug use: No  . Sexual activity: Yes  Other Topics Concern  . Not on file  Social History Narrative   Regular exercise-no   Caffeine Use-yes   Social Determinants of Health   Financial Resource Strain:   . Difficulty of Paying Living Expenses:   Food Insecurity:   . Worried About Charity fundraiser in the Last Year:   . Arboriculturist in the Last Year:   Transportation Needs:   . Film/video editor (Medical):   [name] Kitchen Lack of Transportation (Non-Medical):   Physical Activity:   . Days of Exercise per Week:   . Minutes of Exercise per Session:   Stress:   . Feeling of Stress :   Social Connections:   . Frequency of Communication with Friends and Family:   . Frequency of Social Gatherings with Friends and Family:   . Attends Religious Services:   . Active Member of Clubs or Organizations:   . Attends Archivist Meetings:   [name] Kitchen Marital Status:     Family History  Problem Relation Age of Onset  . Heart disease Mother   . Arthritis Mother   . Hyperlipidemia Mother   . Hypertension Mother   . Heart attack Mother   . Colon polyps Father   . Hashimoto's thyroiditis Sister   . Diabetes Sister   . Thyroid  disease Sister   . Colon cancer Paternal Grandfather   . Thyroid disease Sister     Review of Systems  Constitutional: Negative for chills and fever.  HENT: Positive for postnasal drip.   Eyes: Negative for visual disturbance.  Respiratory: Negative for cough, shortness of breath and wheezing.   Cardiovascular: Negative for chest pain and palpitations.  Gastrointestinal: Positive for abdominal pain and constipation. Negative for nausea.       Rare gerd since fundoplication  Genitourinary: Negative for dysuria.  Musculoskeletal: Positive for back pain.       Knee pain - posterior right knee after fall  Neurological: Positive for headaches (migraines). Negative for dizziness.  Psychiatric/Behavioral: Positive for dysphoric mood. The patient is nervous/anxious.        Objective:   Vitals:   05/26/20 1007  BP: 124/78  Pulse: 100  Temp: 98.2 F (36.8 C)  SpO2: 98%   BP Readings from Last 3 Encounters:  05/26/20 124/78  11/19/19 120/84  11/06/19 120/76   Wt Readings from Last 3 Encounters:  05/26/20 159 lb (72.1 kg)  11/19/19 170 lb (77.1 kg)  11/06/19 166 lb (75.3 kg)   Body mass index is 24.9 kg/m.   Physical Exam    Constitutional: She appears well-developed and well-nourished. No distress.  HENT:  Head: Normocephalic and atraumatic.  Right Ear: External ear normal. Normal ear canal and TM Left Ear: External ear normal.  Normal ear canal and TM Mouth/Throat: Oropharynx is clear and moist.  Eyes: Conjunctivae and EOM are normal.  Neck: Neck supple. No tracheal deviation present. No thyromegaly present.  No carotid bruit  Cardiovascular: Normal rate, regular rhythm and normal heart sounds.   No murmur heard.  No edema. Pulmonary/Chest: Effort normal and breath sounds normal. No respiratory distress. She has no wheezes. She has no rales.  Breast: deferred to Gyn Abdominal: Soft. She exhibits no distension. There is no tenderness.  Msk:  Slight tenderness  posterior right knee, no deformity ror swelling, no anterior pain Lymphadenopathy: She has no cervical adenopathy.  Skin: Skin is warm and dry. She is not diaphoretic.  Psychiatric: She has a normal mood and affect. Her behavior is normal.     Assessment & Plan:    Physical exam: Screening blood work  ordered Immunizations   Discussed shingrix - will give 1st today, others up to date Colonoscopy  Up to date  Mammogram  Up to date - physician for women - Dr Lynnette Caffey Gyn   Up to date  Eye exams  Due - will schedule Exercise  House work Weight  Normal BMI Substance abuse   none     See Problem List for Assessment and Plan of chronic medical problems.    This visit occurred during the SARS-CoV-2 public health emergency.  Safety protocols were in place, including screening questions prior to the visit, additional usage of staff PPE, and extensive cleaning of exam room while observing appropriate contact time as indicated for disinfecting solutions.

## 2020-05-26 ENCOUNTER — Other Ambulatory Visit: Payer: Self-pay

## 2020-05-26 ENCOUNTER — Ambulatory Visit (INDEPENDENT_AMBULATORY_CARE_PROVIDER_SITE_OTHER): Payer: Federal, State, Local not specified - PPO | Admitting: Internal Medicine

## 2020-05-26 ENCOUNTER — Encounter: Payer: Self-pay | Admitting: Internal Medicine

## 2020-05-26 VITALS — BP 124/78 | HR 100 | Temp 98.2°F | Ht 67.0 in | Wt 159.0 lb

## 2020-05-26 DIAGNOSIS — Z0000 Encounter for general adult medical examination without abnormal findings: Secondary | ICD-10-CM | POA: Diagnosis not present

## 2020-05-26 DIAGNOSIS — Z23 Encounter for immunization: Secondary | ICD-10-CM | POA: Diagnosis not present

## 2020-05-26 DIAGNOSIS — M542 Cervicalgia: Secondary | ICD-10-CM

## 2020-05-26 DIAGNOSIS — G43001 Migraine without aura, not intractable, with status migrainosus: Secondary | ICD-10-CM | POA: Diagnosis not present

## 2020-05-26 DIAGNOSIS — R7303 Prediabetes: Secondary | ICD-10-CM | POA: Diagnosis not present

## 2020-05-26 DIAGNOSIS — E7849 Other hyperlipidemia: Secondary | ICD-10-CM | POA: Diagnosis not present

## 2020-05-26 DIAGNOSIS — K219 Gastro-esophageal reflux disease without esophagitis: Secondary | ICD-10-CM

## 2020-05-26 NOTE — Assessment & Plan Note (Signed)
Takes dexilant prn only S/p fundoplication which was very effective

## 2020-05-26 NOTE — Assessment & Plan Note (Signed)
Chronic Check a1c Low sugar / carb diet Stressed regular exercise  

## 2020-05-26 NOTE — Assessment & Plan Note (Signed)
Chronic Takes gabapentin as needed which helps

## 2020-05-26 NOTE — Assessment & Plan Note (Addendum)
Chronic Controlled takes imitrex spray prn or zomig pills continue

## 2020-05-26 NOTE — Assessment & Plan Note (Signed)
Chronic Check lipid panel  Continue daily statin Regular exercise and healthy diet encouraged  

## 2020-05-27 LAB — LIPID PANEL
Cholesterol: 185 mg/dL (ref <200)
HDL: 54 mg/dL (ref >=50)
LDL Cholesterol (Calc): 109 mg/dL (calc) — ABNORMAL HIGH
Non-HDL Cholesterol (Calc): 131 mg/dL (calc) — ABNORMAL HIGH (ref <130)
Total CHOL/HDL Ratio: 3.4 (calc) (ref <5.0)
Triglycerides: 114 mg/dL (ref <150)

## 2020-05-27 LAB — CBC WITH DIFFERENTIAL/PLATELET
Absolute Monocytes: 660 cells/uL (ref 200–950)
Basophils Absolute: 30 cells/uL (ref 0–200)
Basophils Relative: 0.3 %
Eosinophils Absolute: 80 cells/uL (ref 15–500)
Eosinophils Relative: 0.8 %
HCT: 42.5 % (ref 35.0–45.0)
Hemoglobin: 14.2 g/dL (ref 11.7–15.5)
Lymphs Abs: 1170 cells/uL (ref 850–3900)
MCH: 29.5 pg (ref 27.0–33.0)
MCHC: 33.4 g/dL (ref 32.0–36.0)
MCV: 88.4 fL (ref 80.0–100.0)
MPV: 10.7 fL (ref 7.5–12.5)
Monocytes Relative: 6.6 %
Neutro Abs: 8060 cells/uL — ABNORMAL HIGH (ref 1500–7800)
Neutrophils Relative %: 80.6 %
Platelets: 313 10*3/uL (ref 140–400)
RBC: 4.81 10*6/uL (ref 3.80–5.10)
RDW: 12.6 % (ref 11.0–15.0)
Total Lymphocyte: 11.7 %
WBC: 10 10*3/uL (ref 3.8–10.8)

## 2020-05-27 LAB — COMPREHENSIVE METABOLIC PANEL
AG Ratio: 2.2 (calc) (ref 1.0–2.5)
ALT: 29 U/L (ref 6–29)
AST: 18 U/L (ref 10–35)
Albumin: 4.4 g/dL (ref 3.6–5.1)
Alkaline phosphatase (APISO): 54 U/L (ref 37–153)
BUN: 13 mg/dL (ref 7–25)
CO2: 29 mmol/L (ref 20–32)
Calcium: 9.8 mg/dL (ref 8.6–10.4)
Chloride: 102 mmol/L (ref 98–110)
Creat: 1.04 mg/dL (ref 0.50–1.05)
Globulin: 2 g/dL (calc) (ref 1.9–3.7)
Glucose, Bld: 96 mg/dL (ref 65–99)
Potassium: 4.2 mmol/L (ref 3.5–5.3)
Sodium: 142 mmol/L (ref 135–146)
Total Bilirubin: 0.6 mg/dL (ref 0.2–1.2)
Total Protein: 6.4 g/dL (ref 6.1–8.1)

## 2020-05-27 LAB — HEMOGLOBIN A1C
Hgb A1c MFr Bld: 5.6 % of total Hgb (ref <5.7)
Mean Plasma Glucose: 114 (calc)
eAG (mmol/L): 6.3 (calc)

## 2020-05-28 DIAGNOSIS — M533 Sacrococcygeal disorders, not elsewhere classified: Secondary | ICD-10-CM | POA: Diagnosis not present

## 2020-05-28 DIAGNOSIS — M47816 Spondylosis without myelopathy or radiculopathy, lumbar region: Secondary | ICD-10-CM | POA: Diagnosis not present

## 2020-05-28 DIAGNOSIS — M461 Sacroiliitis, not elsewhere classified: Secondary | ICD-10-CM | POA: Diagnosis not present

## 2020-06-09 DIAGNOSIS — F332 Major depressive disorder, recurrent severe without psychotic features: Secondary | ICD-10-CM | POA: Diagnosis not present

## 2020-06-09 DIAGNOSIS — F4310 Post-traumatic stress disorder, unspecified: Secondary | ICD-10-CM | POA: Diagnosis not present

## 2020-06-09 DIAGNOSIS — F900 Attention-deficit hyperactivity disorder, predominantly inattentive type: Secondary | ICD-10-CM | POA: Diagnosis not present

## 2020-06-09 DIAGNOSIS — F411 Generalized anxiety disorder: Secondary | ICD-10-CM | POA: Diagnosis not present

## 2020-06-15 DIAGNOSIS — F4310 Post-traumatic stress disorder, unspecified: Secondary | ICD-10-CM | POA: Diagnosis not present

## 2020-06-15 DIAGNOSIS — F332 Major depressive disorder, recurrent severe without psychotic features: Secondary | ICD-10-CM | POA: Diagnosis not present

## 2020-06-15 DIAGNOSIS — F411 Generalized anxiety disorder: Secondary | ICD-10-CM | POA: Diagnosis not present

## 2020-06-15 DIAGNOSIS — F900 Attention-deficit hyperactivity disorder, predominantly inattentive type: Secondary | ICD-10-CM | POA: Diagnosis not present

## 2020-06-16 DIAGNOSIS — M461 Sacroiliitis, not elsewhere classified: Secondary | ICD-10-CM | POA: Diagnosis not present

## 2020-06-17 ENCOUNTER — Other Ambulatory Visit: Payer: Self-pay | Admitting: Gastroenterology

## 2020-07-08 DIAGNOSIS — F332 Major depressive disorder, recurrent severe without psychotic features: Secondary | ICD-10-CM | POA: Diagnosis not present

## 2020-07-08 DIAGNOSIS — F4310 Post-traumatic stress disorder, unspecified: Secondary | ICD-10-CM | POA: Diagnosis not present

## 2020-07-08 DIAGNOSIS — F411 Generalized anxiety disorder: Secondary | ICD-10-CM | POA: Diagnosis not present

## 2020-07-08 DIAGNOSIS — F900 Attention-deficit hyperactivity disorder, predominantly inattentive type: Secondary | ICD-10-CM | POA: Diagnosis not present

## 2020-07-13 ENCOUNTER — Encounter: Payer: Self-pay | Admitting: Internal Medicine

## 2020-07-16 DIAGNOSIS — M533 Sacrococcygeal disorders, not elsewhere classified: Secondary | ICD-10-CM | POA: Diagnosis not present

## 2020-07-16 DIAGNOSIS — M797 Fibromyalgia: Secondary | ICD-10-CM | POA: Diagnosis not present

## 2020-07-16 DIAGNOSIS — M47816 Spondylosis without myelopathy or radiculopathy, lumbar region: Secondary | ICD-10-CM | POA: Diagnosis not present

## 2020-07-29 DIAGNOSIS — F900 Attention-deficit hyperactivity disorder, predominantly inattentive type: Secondary | ICD-10-CM | POA: Diagnosis not present

## 2020-07-29 DIAGNOSIS — F4310 Post-traumatic stress disorder, unspecified: Secondary | ICD-10-CM | POA: Diagnosis not present

## 2020-07-29 DIAGNOSIS — F411 Generalized anxiety disorder: Secondary | ICD-10-CM | POA: Diagnosis not present

## 2020-07-29 DIAGNOSIS — F332 Major depressive disorder, recurrent severe without psychotic features: Secondary | ICD-10-CM | POA: Diagnosis not present

## 2020-08-04 DIAGNOSIS — F332 Major depressive disorder, recurrent severe without psychotic features: Secondary | ICD-10-CM | POA: Diagnosis not present

## 2020-08-04 DIAGNOSIS — F4310 Post-traumatic stress disorder, unspecified: Secondary | ICD-10-CM | POA: Diagnosis not present

## 2020-08-04 DIAGNOSIS — F411 Generalized anxiety disorder: Secondary | ICD-10-CM | POA: Diagnosis not present

## 2020-08-04 DIAGNOSIS — F900 Attention-deficit hyperactivity disorder, predominantly inattentive type: Secondary | ICD-10-CM | POA: Diagnosis not present

## 2020-08-18 DIAGNOSIS — F332 Major depressive disorder, recurrent severe without psychotic features: Secondary | ICD-10-CM | POA: Diagnosis not present

## 2020-08-18 DIAGNOSIS — F411 Generalized anxiety disorder: Secondary | ICD-10-CM | POA: Diagnosis not present

## 2020-08-18 DIAGNOSIS — F4310 Post-traumatic stress disorder, unspecified: Secondary | ICD-10-CM | POA: Diagnosis not present

## 2020-08-18 DIAGNOSIS — F900 Attention-deficit hyperactivity disorder, predominantly inattentive type: Secondary | ICD-10-CM | POA: Diagnosis not present

## 2020-08-27 DIAGNOSIS — F4310 Post-traumatic stress disorder, unspecified: Secondary | ICD-10-CM | POA: Diagnosis not present

## 2020-08-27 DIAGNOSIS — F900 Attention-deficit hyperactivity disorder, predominantly inattentive type: Secondary | ICD-10-CM | POA: Diagnosis not present

## 2020-08-27 DIAGNOSIS — F332 Major depressive disorder, recurrent severe without psychotic features: Secondary | ICD-10-CM | POA: Diagnosis not present

## 2020-08-27 DIAGNOSIS — F411 Generalized anxiety disorder: Secondary | ICD-10-CM | POA: Diagnosis not present

## 2020-09-13 ENCOUNTER — Other Ambulatory Visit: Payer: Self-pay | Admitting: Internal Medicine

## 2020-09-15 DIAGNOSIS — F900 Attention-deficit hyperactivity disorder, predominantly inattentive type: Secondary | ICD-10-CM | POA: Diagnosis not present

## 2020-09-15 DIAGNOSIS — F411 Generalized anxiety disorder: Secondary | ICD-10-CM | POA: Diagnosis not present

## 2020-09-15 DIAGNOSIS — F4310 Post-traumatic stress disorder, unspecified: Secondary | ICD-10-CM | POA: Diagnosis not present

## 2020-09-15 DIAGNOSIS — F332 Major depressive disorder, recurrent severe without psychotic features: Secondary | ICD-10-CM | POA: Diagnosis not present

## 2020-09-17 DIAGNOSIS — M461 Sacroiliitis, not elsewhere classified: Secondary | ICD-10-CM | POA: Diagnosis not present

## 2020-10-08 DIAGNOSIS — M533 Sacrococcygeal disorders, not elsewhere classified: Secondary | ICD-10-CM | POA: Diagnosis not present

## 2020-10-08 DIAGNOSIS — M47816 Spondylosis without myelopathy or radiculopathy, lumbar region: Secondary | ICD-10-CM | POA: Diagnosis not present

## 2020-10-08 DIAGNOSIS — M48061 Spinal stenosis, lumbar region without neurogenic claudication: Secondary | ICD-10-CM | POA: Diagnosis not present

## 2020-10-13 DIAGNOSIS — F4310 Post-traumatic stress disorder, unspecified: Secondary | ICD-10-CM | POA: Diagnosis not present

## 2020-10-13 DIAGNOSIS — F332 Major depressive disorder, recurrent severe without psychotic features: Secondary | ICD-10-CM | POA: Diagnosis not present

## 2020-10-13 DIAGNOSIS — F900 Attention-deficit hyperactivity disorder, predominantly inattentive type: Secondary | ICD-10-CM | POA: Diagnosis not present

## 2020-10-13 DIAGNOSIS — F411 Generalized anxiety disorder: Secondary | ICD-10-CM | POA: Diagnosis not present

## 2020-10-22 DIAGNOSIS — Z20828 Contact with and (suspected) exposure to other viral communicable diseases: Secondary | ICD-10-CM | POA: Diagnosis not present

## 2020-10-28 DIAGNOSIS — K645 Perianal venous thrombosis: Secondary | ICD-10-CM | POA: Diagnosis not present

## 2020-11-05 ENCOUNTER — Other Ambulatory Visit: Payer: Federal, State, Local not specified - PPO

## 2020-11-05 ENCOUNTER — Encounter: Payer: Self-pay | Admitting: Internal Medicine

## 2020-11-05 ENCOUNTER — Other Ambulatory Visit: Payer: Self-pay | Admitting: Gastroenterology

## 2020-11-05 DIAGNOSIS — Z20822 Contact with and (suspected) exposure to covid-19: Secondary | ICD-10-CM

## 2020-11-06 ENCOUNTER — Telehealth (INDEPENDENT_AMBULATORY_CARE_PROVIDER_SITE_OTHER): Payer: Federal, State, Local not specified - PPO | Admitting: Internal Medicine

## 2020-11-06 ENCOUNTER — Encounter: Payer: Self-pay | Admitting: Internal Medicine

## 2020-11-06 ENCOUNTER — Other Ambulatory Visit: Payer: Self-pay

## 2020-11-06 DIAGNOSIS — R059 Cough, unspecified: Secondary | ICD-10-CM

## 2020-11-06 MED ORDER — PREDNISONE 20 MG PO TABS: 40.0000 mg | ORAL_TABLET | Freq: Every day | ORAL | 0 refills | Status: DC

## 2020-11-06 MED ORDER — PROMETHAZINE-DM 6.25-15 MG/5ML PO SYRP: 5.0000 mL | ORAL_SOLUTION | Freq: Two times a day (BID) | ORAL | 0 refills | Status: DC | PRN

## 2020-11-06 NOTE — Progress Notes (Signed)
Virtual Visit via Video Note  I connected with Pamela Novak on 11/06/20 at 10:00 AM EST by a video enabled telemedicine application and verified that I am speaking with the correct person using two identifiers.  The patient and the provider were at separate locations throughout the entire encounter. Patient location: home, Provider location: work   I discussed the limitations of evaluation and management by telemedicine and the availability of in person appointments. The patient expressed understanding and agreed to proceed. The patient and the provider were the only parties present for the visit unless noted in HPI below.  History of Present Illness: The patient is a 50 y.o. female with visit for losing voice and congestion and body aches. Did have covid-19 test which was negative and another one yesterday which is not back yet. Started several weeks ago. Has symptoms which are not resolving including fatigue and congestion. Denies fevers or SOB. Overall it is not improving. Has tried otc allergy medicine which did not help  Observations/Objective: Appearance: normal, breathing appears normal, voice is hoarse and some coughing during visit, no dyspnea and speaking in full sentences, casual grooming, abdomen does not appear distended, throat not well visualized, mental status is A and O times 3  Assessment and Plan: See problem oriented charting  Follow Up Instructions: rx prednisone and await covid-19 results  I discussed the assessment and treatment plan with the patient. The patient was provided an opportunity to ask questions and all were answered. The patient agreed with the plan and demonstrated an understanding of the instructions.   The patient was advised to call back or seek an in-person evaluation if the symptoms worsen or if the condition fails to improve as anticipated.  Hoyt Koch, MD

## 2020-11-06 NOTE — Assessment & Plan Note (Signed)
Sounds to be more post-viral syndrome. Could be covid-19 or flu or other. She is outside the time line for quarantine at this time so we discussed even if covid-19 test is positive management does not change. Rx prednisone to help. No antibiotic indicated at this time.

## 2020-11-08 LAB — NOVEL CORONAVIRUS, NAA: SARS-CoV-2, NAA: NOT DETECTED

## 2020-11-09 ENCOUNTER — Telehealth (INDEPENDENT_AMBULATORY_CARE_PROVIDER_SITE_OTHER): Payer: Federal, State, Local not specified - PPO | Admitting: Internal Medicine

## 2020-11-09 ENCOUNTER — Other Ambulatory Visit: Payer: Self-pay

## 2020-11-09 ENCOUNTER — Encounter: Payer: Self-pay | Admitting: Internal Medicine

## 2020-11-09 DIAGNOSIS — J019 Acute sinusitis, unspecified: Secondary | ICD-10-CM | POA: Diagnosis not present

## 2020-11-09 MED ORDER — AMOXICILLIN-POT CLAVULANATE 875-125 MG PO TABS: 1.0000 | ORAL_TABLET | Freq: Two times a day (BID) | ORAL | 0 refills | Status: DC

## 2020-11-09 MED ORDER — FLUCONAZOLE 150 MG PO TABS: 150.0000 mg | ORAL_TABLET | Freq: Once | ORAL | 0 refills | Status: AC

## 2020-11-09 NOTE — Assessment & Plan Note (Signed)
Acute Her symptoms are consistent with possible sinus infection, early bronchitis Likely bacterial  Start Augmentin 875-125 mg BID x 10 day Diflucan for possible yeast infection Complete prednisone taper Continue cough medication otc cold medications Rest, fluid Call if no improvement  New note given for work

## 2020-11-09 NOTE — Progress Notes (Signed)
Virtual Visit via Video Note  I connected with Pamela Novak on 11/09/20 at  3:00 PM EST by a video enabled telemedicine application and verified that I am speaking with the correct person using two identifiers.   I discussed the limitations of evaluation and management by telemedicine and the availability of in person appointments. The patient expressed understanding and agreed to proceed.  Present for the visit:  Myself, Dr Billey Gosling, Marya Fossa.  The patient is currently at the Vet's office and I am in the office.    No referring provider.    History of Present Illness: This is an acute visit for cough, fatigue and headache. Since since 1/3 she has been having symptoms.  She did have a virtual visit 1/7 for similar symptoms.  Since then she did have a negative Covid test come back.  Some of her symptoms have improved, but others are not or getting slightly worse.  She was concerned about returning to work, being infectious and whether or not she needed different treatment.  She feels the prednisone and cough medication has helped.  Headaches, fatigue - both better, but still there.  Having hot flashes.  Her cough is productive.  She does have still a lot of nasal congestion with discolored mucus.   Review of Systems  Constitutional: Positive for chills, diaphoresis and malaise/fatigue. Negative for fever.       Hotflashes  HENT: Positive for congestion (green- bloody mucus) and sore throat. Negative for ear pain and sinus pain.   Respiratory: Positive for cough, sputum production, shortness of breath (mild with stairs) and wheezing (occ, and tightness in upper chest).   Neurological: Positive for headaches.      Social History   Socioeconomic History  . Marital status: Married    Spouse name: [name]  . Number of children: 2  . Years of education: 12+  . Highest education level: Not on file  Occupational History  . Occupation: adm Radiation protection practitioner: [employer]   Tobacco Use  . Smoking status: Never Smoker  . Smokeless tobacco: Never Used  Vaping Use  . Vaping Use: Never used  Substance and Sexual Activity  . Alcohol use: Yes    Comment: rare  . Drug use: No  . Sexual activity: Yes  Other Topics Concern  . Not on file  Social History Narrative   Regular exercise-no   Caffeine Use-yes   Social Determinants of Health   Financial Resource Strain: Not on file  Food Insecurity: Not on file  Transportation Needs: Not on file  Physical Activity: Not on file  Stress: Not on file  Social Connections: Not on file     Observations/Objective: Appears well in NAD Hoarse  Breathing normally  Assessment and Plan:  See Problem List for Assessment and Plan of chronic medical problems.   Follow Up Instructions:    I discussed the assessment and treatment plan with the patient. The patient was provided an opportunity to ask questions and all were answered. The patient agreed with the plan and demonstrated an understanding of the instructions.   The patient was advised to call back or seek an in-person evaluation if the symptoms worsen or if the condition fails to improve as anticipated.    [name], MD

## 2020-11-11 DIAGNOSIS — M47816 Spondylosis without myelopathy or radiculopathy, lumbar region: Secondary | ICD-10-CM | POA: Diagnosis not present

## 2020-11-18 DIAGNOSIS — M21612 Bunion of left foot: Secondary | ICD-10-CM | POA: Diagnosis not present

## 2020-11-18 DIAGNOSIS — B079 Viral wart, unspecified: Secondary | ICD-10-CM | POA: Diagnosis not present

## 2020-11-18 DIAGNOSIS — M79671 Pain in right foot: Secondary | ICD-10-CM | POA: Diagnosis not present

## 2020-11-24 ENCOUNTER — Encounter: Payer: Self-pay | Admitting: Internal Medicine

## 2020-11-24 ENCOUNTER — Telehealth: Payer: Self-pay | Admitting: Internal Medicine

## 2020-11-24 MED ORDER — FLUCONAZOLE 100 MG PO TABS: 100.0000 mg | ORAL_TABLET | Freq: Every day | ORAL | 0 refills | Status: DC

## 2020-11-24 NOTE — Telephone Encounter (Signed)
Patient was seen on 01.10.22, was given an antibiotic and steroids which she has finished, but she started developing thrush right when she was finishing those, she took the medication she was given for yeast infections in hopes it would help but she is still having issues with it so she was wondering what she should do. [phone]

## 2020-11-24 NOTE — Telephone Encounter (Signed)
Lets try nystatin swish and spit if she is just having oral thrush. Prescription pending

## 2020-11-24 NOTE — Telephone Encounter (Signed)
Diflucan sent - daily x 7 days.  She needs to hold her atorvastatin while taking the diflucan and then restart it once she finishes it

## 2020-11-25 NOTE — Patient Instructions (Addendum)
  Blood work was ordered.     Medications changes include :   Cough syrup and triamcinolone paste.   Your prescription(s) have been submitted to your pharmacy. Please take as directed and contact our office if you believe you are having problem(s) with the medication(s).    Please followup in 6 months

## 2020-11-25 NOTE — Telephone Encounter (Signed)
Spoke with patient today. 

## 2020-11-25 NOTE — Telephone Encounter (Signed)
Ok to start diflucan today

## 2020-11-25 NOTE — Progress Notes (Signed)
Subjective:    Patient ID: Pamela Novak, female    DOB: 1971/03/13, 50 y.o.   MRN: [id number]  HPI The patient is here for follow up of their chronic medical problems, including prediabetes, hyperlipidemia, migraines, chronic neck pain   Recently treated for sinus infection and thrush.  The sinus infection improved with the antibiotic.  She had dental work down and swallowed several chemicals.  She has thrush now she believes and started the diflucan I prescribed today..  She feels the need to sneeze.  Her throat hurts.  She has itching in throat that makes her cough.  She still has the chronic right throat pain that she saw ENT for in the past.  It hurts in her right ear when she swallows.    Medications and allergies reviewed with patient and updated if appropriate.  Patient Active Problem List   Diagnosis Date Noted  . Acute sinus infection 11/09/2020  . S/P Nissen fundoplication (without gastrostomy tube) procedure 09/05/2019  . LLQ pain 06/14/2019  . Rib pain 05/22/2019  . Chronic constipation 11/16/2018  . Neck pain 09/21/2018  . Migraine without aura and with status migrainosus, not intractable 04/12/2018  . Bilateral low back pain 03/21/2018  . Lumbar disc herniation with chronic lower back pain 03/21/2018  . Prediabetes 03/23/2017  . Seborrheic dermatitis of scalp 03/23/2017  . Hyperlipidemia 01/21/2016  . Depression 01/18/2016  . PTSD (post-traumatic stress disorder) 01/18/2016  . GERD (gastroesophageal reflux disease) 01/18/2016  . Cough 11/11/2015  . Varicose veins 05/21/2013  . GAD (generalized anxiety disorder) 11/02/2012    Current Outpatient Medications on File Prior to Visit  Medication Sig Dispense Refill  . ALPRAZolam (XANAX) 0.25 MG tablet Take 0.25 mg by mouth 2 (two) times daily as needed for anxiety or sleep.     [name] Kitchen amphetamine-dextroamphetamine (ADDERALL) 30 MG tablet Take 1 tablet by mouth daily.    [name] Kitchen atorvastatin (LIPITOR) 20 MG tablet TAKE 1  TABLET BY MOUTH EVERY DAY 90 tablet 1  . buPROPion (WELLBUTRIN XL) 300 MG 24 hr tablet Take 300 mg by mouth daily.     . busPIRone (BUSPAR) 30 MG tablet Take 30 mg by mouth 2 (two) times daily.    [name] Kitchen Dexlansoprazole (DEXILANT) 30 MG capsule Take 1 capsule (30 mg total) by mouth daily. 30 capsule 3  . DULoxetine (CYMBALTA) 30 MG capsule Take 30 mg by mouth daily.    . DULoxetine (CYMBALTA) 60 MG capsule Take 120 mg by mouth daily.     [name] Kitchen EPINEPHrine (AUVI-Q) 0.3 mg/0.3 mL IJ SOAJ injection Inject 0.3 mg into the muscle as needed for anaphylaxis.     . fluconazole (DIFLUCAN) 100 MG tablet Take 1 tablet (100 mg total) by mouth daily. 7 tablet 0  . gabapentin (NEURONTIN) 100 MG capsule TAKE 1 CAPSULE BY MOUTH AT BEDTIME. INCREASE UP TO 300 MG AT NIGHT IF NEEDED (Patient taking differently: Take 100-200 mg by mouth 3 (three) times daily. ) 90 capsule 0  . hydrocortisone (ANUSOL-HC) 25 MG suppository Place 1 suppository (25 mg total) rectally daily as needed for hemorrhoids. 12 suppository 5  . ketoconazole (NIZORAL) 2 % shampoo APPLY 1 APPLICATION TOPICALLY 2 (TWO) TIMES A WEEK. 120 mL 5  . lubiprostone (AMITIZA) 8 MCG capsule TAKE 1 CAPSULE BY MOUTH 2 (TWO) TIMES DAILY WITH A MEAL. 60 capsule 3  . ondansetron (ZOFRAN) 4 MG tablet Take 1 tablet (4 mg total) by mouth every 8 (eight) hours as needed for nausea or  vomiting. 30 tablet 0  . polyethylene glycol powder (GLYCOLAX/MIRALAX) 17 GM/SCOOP powder Take half capful daily and titrate based on response to have 1-2 soft bowel movements daily. 255 g 1  . PROCTOSOL HC 2.5 % rectal cream PLACE 1 APPLICATION RECTALLY 2 (TWO) TIMES DAILY. 28.35 g 1  . sucralfate (CARAFATE) 1 GM/10ML suspension TAKE 10 MLS (1 G TOTAL) BY MOUTH 4 (FOUR) TIMES DAILY - WITH MEALS AND AT BEDTIME. 420 mL 2  . SUMAtriptan (IMITREX) 20 MG/ACT nasal spray Place 1 spray (20 mg total) into the nose every 2 (two) hours as needed for migraine. May repeat in 2 hrs if headache persists or  recurs. 1 Inhaler 5  . VYVANSE 70 MG capsule Take 70 mg by mouth at bedtime.    [name] Kitchen zolmitriptan (ZOMIG) 5 MG tablet Take 1 tablet (5 mg total) by mouth as needed for migraine. 10 tablet 0   No current facility-administered medications on file prior to visit.    Past Medical History:  Diagnosis Date  . Allergy   . Anxiety    severe  . Arthritis   . Asthma    secondary to GERD, no current issues  . Blood in stool   . Breast hematoma   . Complication of anesthesia    "woke up during colonoscopy" age 42 years old  . Constipation   . Depression   . Diverticulosis   . GERD (gastroesophageal reflux disease)   . Hemorrhoids   . History of colon polyps   . Hyperlipidemia   . IBS (irritable bowel syndrome)   . Migraines   . Pre-diabetes   . Restless legs syndrome (RLS)   . Sciatica    Right side  . Traumatic hematoma of knee   . Varicose veins of lower extremity    vagina    Past Surgical History:  Procedure Laterality Date  . [place] STUDY N/A 04/01/2019   Procedure: [place] STUDY;  Surgeon: Mauri Pole, MD;  Location: [facility];  Service: Endoscopy;  Laterality: N/A;  . ABLATION ON ENDOMETRIOSIS  10/04/2012   Procedure: ABLATION ON ENDOMETRIOSIS;  Surgeon: Linda Hedges, DO;  Location: [facility];  Service: Gynecology;  Laterality: N/A;  . COLONOSCOPY    . CONDYLOMA EXCISION/FULGURATION  10/04/2012   Procedure: CONDYLOMA REMOVAL;  Surgeon: Linda Hedges, DO;  Location: [facility];  Service: Gynecology;  Laterality: N/A;  . DILITATION & CURRETTAGE/HYSTROSCOPY WITH NOVASURE ABLATION  10/04/2012   Procedure: DILATATION & CURETTAGE/HYSTEROSCOPY WITH NOVASURE ABLATION;  Surgeon: Linda Hedges, DO;  Location: [facility];  Service: Gynecology;  Laterality: N/A;  . ESOPHAGEAL MANOMETRY N/A 04/01/2019   Procedure: ESOPHAGEAL MANOMETRY (EM);  Surgeon: Mauri Pole, MD;  Location: [facility];  Service: Endoscopy;  Laterality: N/A;  . HEMORRHOID BANDING    . LAPAROSCOPIC NISSEN  FUNDOPLICATION N/A 23/03/5731   Procedure: LAPAROSCOPIC HIATAL HERNIA REPAIR AND NISSEN FUNDOPLICATION;  Surgeon: [name], MD;  Location: [facility];  Service: General;  Laterality: N/A;  . LAPAROSCOPY  10/04/2012   Procedure: LAPAROSCOPY OPERATIVE;  Surgeon: Linda Hedges, DO;  Location: [facility];  Service: Gynecology;  Laterality: N/A;  . MASS EXCISION Bilateral 09/05/2019   Procedure: EXCISION BILATERAL SOFT TISSUE MASSES, THIGH;  Surgeon: [name], MD;  Location: [facility];  Service: General;  Laterality: Bilateral;  . RADIOFREQUENCY ABLATION NERVES    . UPPER GI ENDOSCOPY      Social History   Socioeconomic History  . Marital status: Married    Spouse name:  [name]  . Number of children: 2  . Years of education: 12+  . Highest education level: Not on file  Occupational History  . Occupation: adm Radiation protection practitioner: [employer]  Tobacco Use  . Smoking status: Never Smoker  . Smokeless tobacco: Never Used  Vaping Use  . Vaping Use: Never used  Substance and Sexual Activity  . Alcohol use: Yes    Comment: rare  . Drug use: No  . Sexual activity: Yes  Other Topics Concern  . Not on file  Social History Narrative   Regular exercise-no   Caffeine Use-yes   Social Determinants of Health   Financial Resource Strain: Not on file  Food Insecurity: Not on file  Transportation Needs: Not on file  Physical Activity: Not on file  Stress: Not on file  Social Connections: Not on file    Family History  Problem Relation Age of Onset  . Heart disease Mother   . Arthritis Mother   . Hyperlipidemia Mother   . Hypertension Mother   . Heart attack Mother   . Colon polyps Father   . Hashimoto's thyroiditis Sister   . Diabetes Sister   . Thyroid disease Sister   . Colon cancer Paternal Grandfather   . Thyroid disease Sister     Review of Systems  Constitutional: Negative for fever.       Hot flashes/sweats  Respiratory: Positive for cough and wheezing (minor).  Negative for shortness of breath.   Cardiovascular: Negative for chest pain, palpitations and leg swelling.  Gastrointestinal:       Jerrye Bushy controlled  Neurological: Negative for light-headedness and headaches.  Hematological: Bruises/bleeds easily (bruises easily).       Objective:   Vitals:   11/26/20 1054  BP: 126/88  Pulse: (!) 109  Temp: 98 F (36.7 C)  SpO2: 99%   BP Readings from Last 3 Encounters:  11/26/20 126/88  05/26/20 124/78  11/19/19 120/84   Wt Readings from Last 3 Encounters:  11/26/20 164 lb (74.4 kg)  05/26/20 159 lb (72.1 kg)  11/19/19 170 lb (77.1 kg)   Body mass index is 25.69 kg/m.   Physical Exam    Constitutional: Appears well-developed and well-nourished. No distress.  HENT:  Head: Normocephalic and atraumatic.  Neck: Neck supple. No tracheal deviation present. No thyromegaly present.  No cervical lymphadenopathy Cardiovascular: Normal rate, regular rhythm and normal heart sounds.   No murmur heard. No carotid bruit .  No edema Pulmonary/Chest: Effort normal and breath sounds normal. No respiratory distress. No has no wheezes. No rales.  Skin: Skin is warm and dry. Not diaphoretic.  Psychiatric: Normal mood and affect. Behavior is normal.      Assessment & Plan:    See Problem List for Assessment and Plan of chronic medical problems.    This visit occurred during the SARS-CoV-2 public health emergency.  Safety protocols were in place, including screening questions prior to the visit, additional usage of staff PPE, and extensive cleaning of exam room while observing appropriate contact time as indicated for disinfecting solutions.

## 2020-11-25 NOTE — Telephone Encounter (Signed)
Spoke with patient today. She had already taken her statin for this morning.  Are you okay for her to start diflucan tomorrow now or can she take it later on today?

## 2020-11-26 ENCOUNTER — Ambulatory Visit: Payer: Federal, State, Local not specified - PPO | Admitting: Internal Medicine

## 2020-11-26 ENCOUNTER — Other Ambulatory Visit: Payer: Self-pay

## 2020-11-26 ENCOUNTER — Encounter: Payer: Self-pay | Admitting: Internal Medicine

## 2020-11-26 VITALS — BP 126/88 | HR 109 | Temp 98.0°F | Wt 164.0 lb

## 2020-11-26 DIAGNOSIS — M542 Cervicalgia: Secondary | ICD-10-CM | POA: Diagnosis not present

## 2020-11-26 DIAGNOSIS — R7303 Prediabetes: Secondary | ICD-10-CM

## 2020-11-26 DIAGNOSIS — E7849 Other hyperlipidemia: Secondary | ICD-10-CM

## 2020-11-26 DIAGNOSIS — G43001 Migraine without aura, not intractable, with status migrainosus: Secondary | ICD-10-CM | POA: Diagnosis not present

## 2020-11-26 DIAGNOSIS — J392 Other diseases of pharynx: Secondary | ICD-10-CM

## 2020-11-26 DIAGNOSIS — Z1159 Encounter for screening for other viral diseases: Secondary | ICD-10-CM | POA: Diagnosis not present

## 2020-11-26 DIAGNOSIS — Z23 Encounter for immunization: Secondary | ICD-10-CM | POA: Diagnosis not present

## 2020-11-26 LAB — LIPID PANEL
Cholesterol: 178 mg/dL (ref 0–200)
HDL: 59.9 mg/dL (ref >39.00)
LDL Cholesterol: 99 mg/dL (ref 0–99)
NonHDL: 117.76
Total CHOL/HDL Ratio: 3
Triglycerides: 95 mg/dL (ref 0.0–149.0)
VLDL: 19 mg/dL (ref 0.0–40.0)

## 2020-11-26 LAB — COMPREHENSIVE METABOLIC PANEL
ALT: 28 U/L (ref 0–35)
AST: 20 U/L (ref 0–37)
Albumin: 4.3 g/dL (ref 3.5–5.2)
Alkaline Phosphatase: 48 U/L (ref 39–117)
BUN: 16 mg/dL (ref 6–23)
CO2: 33 mEq/L — ABNORMAL HIGH (ref 19–32)
Calcium: 9.6 mg/dL (ref 8.4–10.5)
Chloride: 103 mEq/L (ref 96–112)
Creatinine, Ser: 0.97 mg/dL (ref 0.40–1.20)
GFR: 67.8 mL/min (ref >60.00)
Glucose, Bld: 101 mg/dL — ABNORMAL HIGH (ref 70–99)
Potassium: 3.9 mEq/L (ref 3.5–5.1)
Sodium: 140 mEq/L (ref 135–145)
Total Bilirubin: 0.6 mg/dL (ref 0.2–1.2)
Total Protein: 6.9 g/dL (ref 6.0–8.3)

## 2020-11-26 LAB — HEMOGLOBIN A1C: Hgb A1c MFr Bld: 6 % (ref 4.6–6.5)

## 2020-11-26 MED ORDER — HYDROCOD POLST-CPM POLST ER 10-8 MG/5ML PO SUER: 5.0000 mL | Freq: Two times a day (BID) | ORAL | 0 refills | Status: DC | PRN

## 2020-11-26 MED ORDER — TRIAMCINOLONE ACETONIDE 0.1 % MT PSTE: 1.0000 "application " | PASTE | Freq: Two times a day (BID) | OROMUCOSAL | 12 refills | Status: DC

## 2020-11-26 NOTE — Assessment & Plan Note (Signed)
Chronic Check a1c Low sugar / carb diet Stressed regular exercise  

## 2020-11-26 NOTE — Assessment & Plan Note (Signed)
Chronic Continue gabapentin 100-300 mg Q HS prn

## 2020-11-26 NOTE — Assessment & Plan Note (Addendum)
Chronic Check lipid panel  Continue atorvastatin 20 mg daily Regular exercise and healthy diet encouraged  

## 2020-11-26 NOTE — Assessment & Plan Note (Signed)
Acute Thrush possibly - complete diflucan irritation from chemical used during dental work Symptomatic treatment with otc cold meds Irritation triggering a cough and she is not sleeping tussionex cough syrup Q 12 prn

## 2020-11-26 NOTE — Assessment & Plan Note (Signed)
Chronic Controlled continue imitrex spray prn or zomig 5 mg prn for migraine zofran 4 mg prn for nausea associated with migraine

## 2020-11-27 LAB — HEPATITIS C ANTIBODY
Hepatitis C Ab: NONREACTIVE
SIGNAL TO CUT-OFF: 0 (ref <1.00)

## 2020-12-02 DIAGNOSIS — M47816 Spondylosis without myelopathy or radiculopathy, lumbar region: Secondary | ICD-10-CM | POA: Diagnosis not present

## 2020-12-02 DIAGNOSIS — B079 Viral wart, unspecified: Secondary | ICD-10-CM | POA: Diagnosis not present

## 2020-12-02 DIAGNOSIS — M461 Sacroiliitis, not elsewhere classified: Secondary | ICD-10-CM | POA: Diagnosis not present

## 2020-12-08 DIAGNOSIS — K080 Exfoliation of teeth due to systemic causes: Secondary | ICD-10-CM | POA: Diagnosis not present

## 2020-12-09 ENCOUNTER — Encounter: Payer: Self-pay | Admitting: Internal Medicine

## 2020-12-09 DIAGNOSIS — F332 Major depressive disorder, recurrent severe without psychotic features: Secondary | ICD-10-CM | POA: Diagnosis not present

## 2020-12-09 DIAGNOSIS — J029 Acute pharyngitis, unspecified: Secondary | ICD-10-CM

## 2020-12-09 DIAGNOSIS — Z1283 Encounter for screening for malignant neoplasm of skin: Secondary | ICD-10-CM

## 2020-12-09 DIAGNOSIS — F4310 Post-traumatic stress disorder, unspecified: Secondary | ICD-10-CM | POA: Diagnosis not present

## 2020-12-09 DIAGNOSIS — J358 Other chronic diseases of tonsils and adenoids: Secondary | ICD-10-CM

## 2020-12-09 DIAGNOSIS — F411 Generalized anxiety disorder: Secondary | ICD-10-CM | POA: Diagnosis not present

## 2020-12-09 DIAGNOSIS — B079 Viral wart, unspecified: Secondary | ICD-10-CM

## 2020-12-09 DIAGNOSIS — F900 Attention-deficit hyperactivity disorder, predominantly inattentive type: Secondary | ICD-10-CM | POA: Diagnosis not present

## 2020-12-23 DIAGNOSIS — M461 Sacroiliitis, not elsewhere classified: Secondary | ICD-10-CM | POA: Diagnosis not present

## 2020-12-29 ENCOUNTER — Encounter: Payer: Self-pay | Admitting: Internal Medicine

## 2020-12-29 MED ORDER — FLUCONAZOLE 100 MG PO TABS: 100.0000 mg | ORAL_TABLET | Freq: Every day | ORAL | 0 refills | Status: DC

## 2020-12-31 DIAGNOSIS — R070 Pain in throat: Secondary | ICD-10-CM | POA: Diagnosis not present

## 2020-12-31 DIAGNOSIS — F411 Generalized anxiety disorder: Secondary | ICD-10-CM | POA: Diagnosis not present

## 2020-12-31 DIAGNOSIS — F332 Major depressive disorder, recurrent severe without psychotic features: Secondary | ICD-10-CM | POA: Diagnosis not present

## 2020-12-31 DIAGNOSIS — F900 Attention-deficit hyperactivity disorder, predominantly inattentive type: Secondary | ICD-10-CM | POA: Diagnosis not present

## 2020-12-31 DIAGNOSIS — F4310 Post-traumatic stress disorder, unspecified: Secondary | ICD-10-CM | POA: Diagnosis not present

## 2021-01-04 ENCOUNTER — Other Ambulatory Visit: Payer: Self-pay | Admitting: Gastroenterology

## 2021-01-05 DIAGNOSIS — F411 Generalized anxiety disorder: Secondary | ICD-10-CM | POA: Diagnosis not present

## 2021-01-05 DIAGNOSIS — F4310 Post-traumatic stress disorder, unspecified: Secondary | ICD-10-CM | POA: Diagnosis not present

## 2021-01-05 DIAGNOSIS — F332 Major depressive disorder, recurrent severe without psychotic features: Secondary | ICD-10-CM | POA: Diagnosis not present

## 2021-01-05 DIAGNOSIS — F900 Attention-deficit hyperactivity disorder, predominantly inattentive type: Secondary | ICD-10-CM | POA: Diagnosis not present

## 2021-01-12 DIAGNOSIS — Z01419 Encounter for gynecological examination (general) (routine) without abnormal findings: Secondary | ICD-10-CM | POA: Diagnosis not present

## 2021-01-12 DIAGNOSIS — Z1231 Encounter for screening mammogram for malignant neoplasm of breast: Secondary | ICD-10-CM | POA: Diagnosis not present

## 2021-01-13 DIAGNOSIS — M461 Sacroiliitis, not elsewhere classified: Secondary | ICD-10-CM | POA: Diagnosis not present

## 2021-01-13 DIAGNOSIS — F411 Generalized anxiety disorder: Secondary | ICD-10-CM | POA: Diagnosis not present

## 2021-01-13 DIAGNOSIS — M533 Sacrococcygeal disorders, not elsewhere classified: Secondary | ICD-10-CM | POA: Diagnosis not present

## 2021-01-13 DIAGNOSIS — M47816 Spondylosis without myelopathy or radiculopathy, lumbar region: Secondary | ICD-10-CM | POA: Diagnosis not present

## 2021-01-14 ENCOUNTER — Encounter: Payer: Self-pay | Admitting: Gastroenterology

## 2021-01-14 ENCOUNTER — Ambulatory Visit: Payer: Federal, State, Local not specified - PPO | Admitting: Gastroenterology

## 2021-01-14 ENCOUNTER — Other Ambulatory Visit: Payer: Self-pay

## 2021-01-14 VITALS — BP 140/82 | HR 114 | Ht 67.0 in | Wt 161.0 lb

## 2021-01-14 DIAGNOSIS — R053 Chronic cough: Secondary | ICD-10-CM

## 2021-01-14 DIAGNOSIS — K219 Gastro-esophageal reflux disease without esophagitis: Secondary | ICD-10-CM

## 2021-01-14 DIAGNOSIS — R198 Other specified symptoms and signs involving the digestive system and abdomen: Secondary | ICD-10-CM | POA: Diagnosis not present

## 2021-01-14 DIAGNOSIS — R0989 Other specified symptoms and signs involving the circulatory and respiratory systems: Secondary | ICD-10-CM

## 2021-01-14 DIAGNOSIS — K5902 Outlet dysfunction constipation: Secondary | ICD-10-CM

## 2021-01-14 DIAGNOSIS — J312 Chronic pharyngitis: Secondary | ICD-10-CM

## 2021-01-14 NOTE — Patient Instructions (Signed)
You have been scheduled for an endoscopy/[facility]. Please follow written instructions given to you at your visit today. If you use inhalers (even only as needed), please bring them with you on the day of your procedure.  Use Clotrimazole lozenges up to four times a day  We will refer you to Pel[facility] Floor Therapy and they will contact you with that appointment  Due to recent changes in healthcare laws, you may see the results of your imaging and laboratory studies on MyChart before your provider has had a chance to review them.  We understand that in some cases there may be results that are confusing or concerning to you. Not all laboratory results come back in the same time frame and the provider may be waiting for multiple results in order to interpret others.  Please give [place] 48 hours in order for your provider to thoroughly review all the results before contacting the office for clarification of your results.   I appreciate the  opportunity to care for you  Thank You   Harl Bowie , MD

## 2021-01-14 NOTE — Progress Notes (Signed)
Pamela Novak    [id number]    Jul 09, 1971  Primary Care Physician:Burns, Claudina Lick, MD  Referring Physician: [name], MD [place],  [place] [address]   Chief complaint:  GERD   HPI:  50 year old very pleasant female with history of chronic GERD s/p Nissen fundoplication here for follow-up visit. She is having persistent tickle in her throat with globus sensation, postnasal drip and sore throat.  Her symptoms started with upper respiratory infection, since then she has been having cough.  She was treated for oro pharyngeal thrush twice with no improvement    She feels the surgery has helped her symptoms to a large extent.  She does not have severe heartburn like she was having before. Upper GI series showed flow of barium through EG junction and no gastroesophageal reflux  Denies any dysphagia, odynophagia, vomiting, abdominal pain, melena or blood per rectum.  Review of system positive for constipation with incomplete evacuation  EGD May 10, 2017 LA grade D reflux esophagitis, gastritis and gastric nodule biopsied negative for dysplasia, intestinal metaplasia or malignancy. H. pylori negative. Colonoscopy July 04, 2018:2 sessile serrated adenomatous polyps removed, diverticulosis and hemorrhoids    Outpatient Encounter Medications as of 01/14/2021  Medication Sig  . ALPRAZolam (XANAX) 0.5 MG tablet Take 0.5 mg by mouth 2 (two) times daily as needed for anxiety or sleep.  [name] Kitchen amphetamine-dextroamphetamine (ADDERALL) 30 MG tablet Take 1 tablet by mouth 2 (two) times daily.  [name] Kitchen atorvastatin (LIPITOR) 20 MG tablet TAKE 1 TABLET BY MOUTH EVERY DAY  . buPROPion (WELLBUTRIN XL) 300 MG 24 hr tablet Take 300 mg by mouth daily.   . busPIRone (BUSPAR) 30 MG tablet Take 30 mg by mouth 2 (two) times daily.  . chlorpheniramine-HYDROcodone (TUSSIONEX PENNKINETIC ER) 10-8 MG/5ML SUER Take 5 mLs by mouth every 12 (twelve) hours as needed for cough.  .  Dexlansoprazole (DEXILANT) 30 MG capsule Take 1 capsule (30 mg total) by mouth daily.  . DULoxetine (CYMBALTA) 30 MG capsule Take 30 mg by mouth daily.  . DULoxetine (CYMBALTA) 60 MG capsule Take 120 mg by mouth daily.   [name] Kitchen EPINEPHrine 0.3 mg/0.3 mL IJ SOAJ injection Inject 0.3 mg into the muscle as needed for anaphylaxis.   [name] Kitchen gabapentin (NEURONTIN) 100 MG capsule TAKE 1 CAPSULE BY MOUTH AT BEDTIME. INCREASE UP TO 300 MG AT NIGHT IF NEEDED (Patient taking differently: Take 100-200 mg by mouth 3 (three) times daily.)  . hydrocortisone (ANUSOL-HC) 2.5 % rectal cream PLACE 1 APPLICATION RECTALLY 2 (TWO) TIMES DAILY.  . hydrocortisone (ANUSOL-HC) 25 MG suppository Place 1 suppository (25 mg total) rectally daily as needed for hemorrhoids.  [name] Kitchen ketoconazole (NIZORAL) 2 % shampoo APPLY 1 APPLICATION TOPICALLY 2 (TWO) TIMES A WEEK.  . lubiprostone (AMITIZA) 8 MCG capsule TAKE 1 CAPSULE BY MOUTH 2 (TWO) TIMES DAILY WITH A MEAL.  [name] Kitchen ondansetron (ZOFRAN) 4 MG tablet Take 1 tablet (4 mg total) by mouth every 8 (eight) hours as needed for nausea or vomiting.  . SUMAtriptan (IMITREX) 20 MG/ACT nasal spray Place 1 spray (20 mg total) into the nose every 2 (two) hours as needed for migraine. May repeat in 2 hrs if headache persists or recurs.  . triamcinolone (KENALOG) 0.1 % paste Use as directed 1 application in the mouth or throat 2 (two) times daily.  [name] Kitchen VYVANSE 70 MG capsule Take 70 mg by mouth at bedtime.  [name] Kitchen zolmitriptan (ZOMIG) 5 MG  tablet Take 1 tablet (5 mg total) by mouth as needed for migraine.  . polyethylene glycol powder (GLYCOLAX/MIRALAX) 17 GM/SCOOP powder Take half capful daily and titrate based on response to have 1-2 soft bowel movements daily. (Patient not taking: Reported on 01/14/2021)  . sucralfate (CARAFATE) 1 GM/10ML suspension TAKE 10 MLS (1 G TOTAL) BY MOUTH 4 (FOUR) TIMES DAILY - WITH MEALS AND AT BEDTIME. (Patient not taking: Reported on 01/14/2021)  . [DISCONTINUED] fluconazole (DIFLUCAN) 100  MG tablet Take 1 tablet (100 mg total) by mouth daily.   No facility-administered encounter medications on file as of 01/14/2021.    Allergies as of 01/14/2021 - Review Complete 01/14/2021  Allergen Reaction Noted  . Capsaicin Cough 01/18/2016  . Nsaids  08/30/2019  . Other Other (See Comments) 09/19/2018  . Flagyl [metronidazole] Rash 09/11/2012    Past Medical History:  Diagnosis Date  . ADHD   . Allergy   . Anxiety    severe  . Arthritis   . Asthma    secondary to GERD, no current issues  . Blood in stool   . Breast hematoma   . Complication of anesthesia    "woke up during colonoscopy" age 82 years old  . Constipation   . Depression   . Diverticulosis   . GERD (gastroesophageal reflux disease)   . Hemorrhoids   . History of colon polyps   . Hyperlipidemia   . IBS (irritable bowel syndrome)   . Migraines   . Post traumatic stress disorder (PTSD)   . Pre-diabetes   . Restless legs syndrome (RLS)   . Sciatica    Right side  . Traumatic hematoma of knee   . Varicose veins of lower extremity    vagina    Past Surgical History:  Procedure Laterality Date  . [place] STUDY N/A 04/01/2019   Procedure: [place] STUDY;  Surgeon: Mauri Pole, MD;  [facility]: [facility];  Service: Endoscopy;  Laterality: N/A;  . ABLATION ON ENDOMETRIOSIS  10/04/2012   Procedure: ABLATION ON ENDOMETRIOSIS;  Surgeon: Linda Hedges, DO;  Location: [facility];  Service: Gynecology;  Laterality: N/A;  . COLONOSCOPY    . CONDYLOMA EXCISION/FULGURATION  10/04/2012   Procedure: CONDYLOMA REMOVAL;  Surgeon: Linda Hedges, DO;  Location: [facility];  Service: Gynecology;  Laterality: N/A;  . DILITATION & CURRETTAGE/HYSTROSCOPY WITH NOVASURE ABLATION  10/04/2012   Procedure: DILATATION & CURETTAGE/HYSTEROSCOPY WITH NOVASURE ABLATION;  Surgeon: Linda Hedges, DO;  Location: [facility];  Service: Gynecology;  Laterality: N/A;  . ESOPHAGEAL MANOMETRY N/A 04/01/2019   Procedure: ESOPHAGEAL MANOMETRY (EM);   Surgeon: Mauri Pole, MD;  [facility];  Service: Endoscopy;  Laterality: N/A;  . HEMORRHOID BANDING    . LAPAROSCOPIC NISSEN FUNDOPLICATION N/A 73/04/1061   Procedure: LAPAROSCOPIC HIATAL HERNIA REPAIR AND NISSEN FUNDOPLICATION;  Surgeon: [name], MD;  Location: [facility];  Service: General;  Laterality: N/A;  . LAPAROSCOPY  10/04/2012   Procedure: LAPAROSCOPY OPERATIVE;  Surgeon: Linda Hedges, DO;  Location: [facility];  Service: Gynecology;  Laterality: N/A;  . MASS EXCISION Bilateral 09/05/2019   Procedure: EXCISION BILATERAL SOFT TISSUE MASSES, THIGH;  Surgeon: [name], MD;  [facility]: [facility];  Service: General;  Laterality: Bilateral;  . RADIOFREQUENCY ABLATION NERVES    . UPPER GI ENDOSCOPY      Family History  Problem Relation Age of Onset  . Heart disease Mother   . Arthritis Mother   . Hyperlipidemia Mother   . Hypertension Mother   .  Heart attack Mother   . Colon polyps Father   . Hashimoto's thyroiditis Sister   . Diabetes Sister   . Thyroid disease Sister   . Colon cancer Paternal Grandfather   . Thyroid disease Sister     Social History   Socioeconomic History  . Marital status: Married    Spouse name: [name]  . Number of children: 2  . Years of education: 12+  . Highest education level: Not on file  Occupational History  . Occupation: adm Radiation protection practitioner: [employer]  Tobacco Use  . Smoking status: Never Smoker  . Smokeless tobacco: Never Used  Vaping Use  . Vaping Use: Never used  Substance and Sexual Activity  . Alcohol use: Yes    Comment: rare  . Drug use: No  . Sexual activity: Yes  Other Topics Concern  . Not on file  Social History Narrative   Regular exercise-no   Caffeine Use-yes   Social Determinants of Health   Financial Resource Strain: Not on file  Food Insecurity: Not on file  Transportation Needs: Not on file  Physical Activity: Not on file  Stress: Not on file  Social Connections: Not on file   Intimate Partner Violence: Not on file      Review of systems: All other review of systems negative except as mentioned in the HPI.   Physical Exam: Vitals:   01/14/21 1500  BP: 140/82  Pulse: (!) 114  SpO2: 97%   Body mass index is 25.22 kg/m. Gen:      No acute distress HEENT:  sclera anicteric, no evidence of oropharyngeal thrush Abd:      soft, non-tender; no palpable masses, no distension Ext:    No edema Neuro: alert and oriented x 3 Psych: normal mood and affect  Data Reviewed:  Reviewed labs, radiology imaging, old records and pertinent past GI work up   Assessment and Plan/Recommendations:  50 year old very pleasant female with history of GERD s/p Nissen fundoplication with complaints of persistent globus sensation, sore throat and cough  Will schedule for EGD with 28-hour pH Bravo to exclude recurrent acid reflux, eosinophilic esophagitis or erosive esophagitis. Hold PPI for 7 days prior to the Bravo pH study Continue current medications as prescribed by her PMD and psychiatrist, she does not have to hold any prior to procedure  Advised patient to use clotrimazole lozenges over-the-counter up to 4-5 times daily as needed if she develops recurrent oral thrush  Chronic constipation with outlet dysfunction and dyssynergic defecation: Refer to pelvic floor physical therapy for biofeedback   The patient was provided an opportunity to ask questions and all were answered. The patient agreed with the plan and demonstrated an understanding of the instructions.  Damaris Hippo , MD    CC: [name], MD

## 2021-01-19 ENCOUNTER — Other Ambulatory Visit: Payer: Self-pay | Admitting: *Deleted

## 2021-01-19 MED ORDER — CLOTRIMAZOLE 10 MG MT TROC: OROMUCOSAL | 0 refills | Status: DC

## 2021-01-20 ENCOUNTER — Encounter: Payer: Self-pay | Admitting: Gastroenterology

## 2021-01-21 ENCOUNTER — Other Ambulatory Visit: Payer: Self-pay | Admitting: Physician Assistant

## 2021-01-21 ENCOUNTER — Other Ambulatory Visit: Payer: Self-pay | Admitting: Internal Medicine

## 2021-01-21 DIAGNOSIS — R070 Pain in throat: Secondary | ICD-10-CM | POA: Diagnosis not present

## 2021-01-21 DIAGNOSIS — G8929 Other chronic pain: Secondary | ICD-10-CM

## 2021-01-21 DIAGNOSIS — J3489 Other specified disorders of nose and nasal sinuses: Secondary | ICD-10-CM | POA: Diagnosis not present

## 2021-01-26 ENCOUNTER — Telehealth: Payer: Self-pay | Admitting: Gastroenterology

## 2021-01-26 NOTE — Telephone Encounter (Signed)
Called patient back, and she states since her office visit on 01/14/21 she has been having intermittent LUQ pain. Bending over makes it worse, and applying pressure makes it better. Denies n/v or fever. Had a good BM 2 days ago, and does not feel it it constipation. She is worried something is wrong. Please advise

## 2021-01-27 DIAGNOSIS — F411 Generalized anxiety disorder: Secondary | ICD-10-CM | POA: Diagnosis not present

## 2021-01-27 DIAGNOSIS — F332 Major depressive disorder, recurrent severe without psychotic features: Secondary | ICD-10-CM | POA: Diagnosis not present

## 2021-01-27 DIAGNOSIS — F4310 Post-traumatic stress disorder, unspecified: Secondary | ICD-10-CM | POA: Diagnosis not present

## 2021-01-27 DIAGNOSIS — F900 Attention-deficit hyperactivity disorder, predominantly inattentive type: Secondary | ICD-10-CM | POA: Diagnosis not present

## 2021-02-05 ENCOUNTER — Ambulatory Visit: Payer: Federal, State, Local not specified - PPO | Attending: Gastroenterology | Admitting: Physical Therapy

## 2021-02-05 ENCOUNTER — Ambulatory Visit
Admission: RE | Admit: 2021-02-05 | Discharge: 2021-02-05 | Disposition: A | Discharge status: Home / Self Care | Payer: Federal, State, Local not specified - PPO | Source: Ambulatory Visit | Attending: Physician Assistant | Admitting: Physician Assistant

## 2021-02-05 ENCOUNTER — Encounter: Payer: Self-pay | Admitting: Physical Therapy

## 2021-02-05 ENCOUNTER — Other Ambulatory Visit: Payer: Self-pay

## 2021-02-05 DIAGNOSIS — K5902 Outlet dysfunction constipation: Secondary | ICD-10-CM | POA: Diagnosis not present

## 2021-02-05 DIAGNOSIS — M47812 Spondylosis without myelopathy or radiculopathy, cervical region: Secondary | ICD-10-CM | POA: Diagnosis not present

## 2021-02-05 DIAGNOSIS — R070 Pain in throat: Secondary | ICD-10-CM

## 2021-02-05 DIAGNOSIS — R278 Other lack of coordination: Secondary | ICD-10-CM | POA: Diagnosis not present

## 2021-02-05 DIAGNOSIS — E041 Nontoxic single thyroid nodule: Secondary | ICD-10-CM | POA: Diagnosis not present

## 2021-02-05 DIAGNOSIS — G8929 Other chronic pain: Secondary | ICD-10-CM

## 2021-02-05 DIAGNOSIS — M6281 Muscle weakness (generalized): Secondary | ICD-10-CM | POA: Insufficient documentation

## 2021-02-05 MED ORDER — IOPAMIDOL (ISOVUE-300) INJECTION 61%
75.0000 mL | Freq: Once | INTRAVENOUS | Status: AC | PRN
  Administered 2021-02-05: 75 mL via INTRAVENOUS
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
## 2021-02-06 ENCOUNTER — Encounter: Payer: Self-pay | Admitting: Internal Medicine

## 2021-02-08 DIAGNOSIS — N819 Female genital prolapse, unspecified: Secondary | ICD-10-CM | POA: Diagnosis not present

## 2021-02-09 DIAGNOSIS — F411 Generalized anxiety disorder: Secondary | ICD-10-CM | POA: Diagnosis not present

## 2021-02-09 DIAGNOSIS — F4310 Post-traumatic stress disorder, unspecified: Secondary | ICD-10-CM | POA: Diagnosis not present

## 2021-02-09 DIAGNOSIS — F900 Attention-deficit hyperactivity disorder, predominantly inattentive type: Secondary | ICD-10-CM | POA: Diagnosis not present

## 2021-02-10 ENCOUNTER — Encounter: Payer: Federal, State, Local not specified - PPO | Admitting: Physical Therapy

## 2021-02-18 ENCOUNTER — Other Ambulatory Visit: Payer: Self-pay | Admitting: Physician Assistant

## 2021-02-18 DIAGNOSIS — E041 Nontoxic single thyroid nodule: Secondary | ICD-10-CM

## 2021-02-19 ENCOUNTER — Other Ambulatory Visit: Payer: Self-pay | Admitting: Internal Medicine

## 2021-02-19 ENCOUNTER — Ambulatory Visit
Admission: RE | Admit: 2021-02-19 | Discharge: 2021-02-19 | Disposition: A | Discharge status: Home / Self Care | Payer: Federal, State, Local not specified - PPO | Source: Ambulatory Visit | Attending: Physician Assistant | Admitting: Physician Assistant

## 2021-02-19 DIAGNOSIS — E041 Nontoxic single thyroid nodule: Secondary | ICD-10-CM

## 2021-02-22 DIAGNOSIS — F411 Generalized anxiety disorder: Secondary | ICD-10-CM | POA: Diagnosis not present

## 2021-02-22 DIAGNOSIS — F900 Attention-deficit hyperactivity disorder, predominantly inattentive type: Secondary | ICD-10-CM | POA: Diagnosis not present

## 2021-02-22 DIAGNOSIS — F332 Major depressive disorder, recurrent severe without psychotic features: Secondary | ICD-10-CM | POA: Diagnosis not present

## 2021-02-22 DIAGNOSIS — F4310 Post-traumatic stress disorder, unspecified: Secondary | ICD-10-CM | POA: Diagnosis not present

## 2021-02-24 DIAGNOSIS — M5416 Radiculopathy, lumbar region: Secondary | ICD-10-CM | POA: Diagnosis not present

## 2021-02-24 DIAGNOSIS — M47816 Spondylosis without myelopathy or radiculopathy, lumbar region: Secondary | ICD-10-CM | POA: Diagnosis not present

## 2021-02-25 ENCOUNTER — Other Ambulatory Visit: Payer: Self-pay

## 2021-02-25 DIAGNOSIS — M47816 Spondylosis without myelopathy or radiculopathy, lumbar region: Secondary | ICD-10-CM | POA: Diagnosis not present

## 2021-02-25 MED ORDER — CLOTRIMAZOLE 10 MG MT TROC: 10.0000 mg | Freq: Four times a day (QID) | OROMUCOSAL | 0 refills | Status: DC | PRN

## 2021-02-26 ENCOUNTER — Other Ambulatory Visit: Payer: Self-pay | Admitting: Internal Medicine

## 2021-02-26 DIAGNOSIS — J3089 Other allergic rhinitis: Secondary | ICD-10-CM | POA: Diagnosis not present

## 2021-02-26 DIAGNOSIS — J383 Other diseases of vocal cords: Secondary | ICD-10-CM | POA: Diagnosis not present

## 2021-02-26 DIAGNOSIS — K219 Gastro-esophageal reflux disease without esophagitis: Secondary | ICD-10-CM | POA: Diagnosis not present

## 2021-02-26 DIAGNOSIS — L509 Urticaria, unspecified: Secondary | ICD-10-CM | POA: Diagnosis not present

## 2021-03-04 DIAGNOSIS — N816 Rectocele: Secondary | ICD-10-CM | POA: Diagnosis not present

## 2021-03-09 ENCOUNTER — Telehealth: Payer: Self-pay | Admitting: Gastroenterology

## 2021-03-09 DIAGNOSIS — F411 Generalized anxiety disorder: Secondary | ICD-10-CM | POA: Diagnosis not present

## 2021-03-09 DIAGNOSIS — F4310 Post-traumatic stress disorder, unspecified: Secondary | ICD-10-CM | POA: Diagnosis not present

## 2021-03-09 DIAGNOSIS — F900 Attention-deficit hyperactivity disorder, predominantly inattentive type: Secondary | ICD-10-CM | POA: Diagnosis not present

## 2021-03-09 NOTE — Telephone Encounter (Signed)
Please call pt. She has questions regarding her procedure with BRAVO. Thank you

## 2021-03-09 NOTE — Telephone Encounter (Signed)
All pt questions have been answered regarding procedure date time and location .

## 2021-03-11 ENCOUNTER — Telehealth: Payer: Self-pay | Admitting: Gastroenterology

## 2021-03-11 ENCOUNTER — Encounter: Payer: Self-pay | Admitting: Gastroenterology

## 2021-03-11 ENCOUNTER — Other Ambulatory Visit: Payer: Self-pay

## 2021-03-11 ENCOUNTER — Ambulatory Visit (AMBULATORY_SURGERY_CENTER): Payer: Federal, State, Local not specified - PPO | Admitting: Gastroenterology

## 2021-03-11 ENCOUNTER — Other Ambulatory Visit: Payer: Self-pay | Admitting: Internal Medicine

## 2021-03-11 VITALS — BP 132/84 | HR 100 | Temp 97.5°F | Resp 11 | Ht 67.0 in | Wt 161.0 lb

## 2021-03-11 DIAGNOSIS — J312 Chronic pharyngitis: Secondary | ICD-10-CM

## 2021-03-11 DIAGNOSIS — K219 Gastro-esophageal reflux disease without esophagitis: Secondary | ICD-10-CM | POA: Diagnosis not present

## 2021-03-11 DIAGNOSIS — R053 Chronic cough: Secondary | ICD-10-CM

## 2021-03-11 DIAGNOSIS — R12 Heartburn: Secondary | ICD-10-CM | POA: Diagnosis not present

## 2021-03-11 DIAGNOSIS — R0789 Other chest pain: Secondary | ICD-10-CM

## 2021-03-11 HISTORY — PX: OTHER SURGICAL HISTORY: SHX169

## 2021-03-11 MED ORDER — LIDOCAINE VISCOUS HCL 2 % MT SOLN: 15.0000 mL | OROMUCOSAL | 0 refills | Status: DC | PRN

## 2021-03-11 MED ORDER — SODIUM CHLORIDE 0.9 % IV SOLN: 500.0000 mL | Freq: Once | INTRAVENOUS | Status: DC

## 2021-03-11 NOTE — Telephone Encounter (Signed)
Patients husband called and said every time she eats she is getting a sharp pain in her chest and it has not gotten any better and it is also happening just with her drinking tea. Seeking advise.

## 2021-03-11 NOTE — Progress Notes (Signed)
Called to room to assist during endoscopic procedure.  Patient ID and intended procedure confirmed with present staff. Received instructions for my participation in the procedure from the performing physician.  Capsule expiration date- 02/11/22  Capsule ID number [id number]  LES measurement: 30 (capsule placed 6 cm above LES)   Time of implant: 1013

## 2021-03-11 NOTE — Telephone Encounter (Signed)
Spoke with Dr. Henrene Pastor.  He feels that the pain is coming from the capsule.  Prescribed viscous lidocaine to use prior to meals.  [facility] to [facility] on [address]  Instructed her to gargle and swallow 60 cc.  Advised her she can also take extra strength tylenol every 6 hrs.  Instructed pt to call after hours number if symptoms worsen or anything new arises.

## 2021-03-11 NOTE — Op Note (Signed)
[facility] Patient Name: Pamela Novak Procedure Date: 03/11/2021 9:36 AM MRN: [id number] Endoscopist: Mauri Pole , MD Age: 50 Referring MD:  Date of Birth: 03-22-1971 Gender: Female Account #: [id number] Procedure:                Upper GI endoscopy Indications:              Esophageal reflux symptoms that persist despite                            appropriate therapy, Esophageal reflux symptoms                            that recur despite appropriate therapy Medicines:                Monitored Anesthesia Care Procedure:                Pre-Anesthesia Assessment:                           - Prior to the procedure, a History and Physical                            was performed, and patient medications and                            allergies were reviewed. The patient's tolerance of                            previous anesthesia was also reviewed. The risks                            and benefits of the procedure and the sedation                            options and risks were discussed with the patient.                            All questions were answered, and informed consent                            was obtained. Prior Anticoagulants: The patient has                            taken no previous anticoagulant or antiplatelet                            agents. ASA Grade Assessment: II - A patient with                            mild systemic disease. After reviewing the risks                            and benefits, the patient was deemed in  satisfactory condition to undergo the procedure.                           After obtaining informed consent, the endoscope was                            passed under direct vision. Throughout the                            procedure, the patient's blood pressure, pulse, and                            oxygen saturations were monitored continuously. The                            Endoscope was  introduced through the mouth, and                            advanced to the second part of duodenum. The upper                            GI endoscopy was accomplished without difficulty.                            The patient tolerated the procedure well. Scope In: Scope Out: Findings:                 A prior Nissen fundoplication was found at the                            gastroesophageal junction. Intact fundoplication.                           The Z-line was regular and was found 36 cm from the                            incisors. Normal esophagus. The BRAVO capsule with                            delivery system was introduced through the mouth                            and advanced into the esophagus, such that the                            BRAVO pH capsule was positioned 30 cm from the                            incisors, which was 6 cm proximal to the GE                            junction. The BRAVO pH capsule was then deployed  and attached to the esophageal mucosa. The delivery                            system was then withdrawn. Endoscopy was utilized                            for probe placement and diagnostic evaluation.                           The stomach was normal.                           The cardia and gastric fundus were normal on                            retroflexion.                           The examined duodenum was normal. Complications:            No immediate complications. Estimated Blood Loss:     Estimated blood loss: none. Impression:               - A Nissen fundoplication was found.                           - Z-line regular, 36 cm from the incisors.                           - Normal stomach.                           - Normal examined duodenum.                           - The BRAVO pH capsule was deployed.                           - No specimens collected. Recommendation:           - Patient has a contact number  available for                            emergencies. The signs and symptoms of potential                            delayed complications were discussed with the                            patient. Return to normal activities tomorrow.                            Written discharge instructions were provided to the                            patient.                           -  Resume previous diet.                           - Continue present medications. Mauri Pole, MD 03/11/2021 10:20:06 AM This report has been signed electronically.

## 2021-03-11 NOTE — Progress Notes (Signed)
PT taken to PACU. Monitors in place. VSS. Report given to RN. 

## 2021-03-11 NOTE — Patient Instructions (Signed)
Post-op Bravo pH instructions Once you get home:  Eat normally and go about your daily routine/activities Limit drinking fluids or eating between meals Do not chew gum or eat hard candy DO NOT take any antacid or anti-reflux medications during the 48-hour monitoring time, unless instructed by your physician  Recording events: Events to be recorded are:  Record using event buttons on recorder and write on paper diary form 1. Every time you eat or drink something (other than water) 2.   Periods of lying down/reclining 3.  Symptoms:  may include heartburn, regurgitation, chest pain, cough or specify if other.  A paper diary is also provided to record the times of your reflux symptoms and times for meals and when you lie down.  The recorder needs to remain within 3 feet (arms length) of you during the testing period (48 hours). If you should forget and move outside of a 3-foot radius of the receiver you may hear beeping and you will see a "C1" error in the display window on the top of the receiver.  Please pick up the receiver and hold close to you to re-establish the connection and the error message disappears.  You may take a bath/shower during the testing period, but the recorder must not get wet and must remain within 3 feet of you. Please leave the receiver outside of the shower or tub while bathing. The monitoring period will be for 48 hours after placement of the capsule.  At the end of the 48 hours, you will return the recorder, and your diary, to our [facility] front desk.  A nurse will meet you to collect the device and answer any questions you may have.  The device should turn off once the 48 hours is complete.   What to expect after placement of the capsule:  Some patients experience a vague sensation that something is in their esophagus or that they 'feel' the capsule when they swallow food.  Should you experience this, chewing food carefully or drinking liquids may  minimize this sensation.   After the test is complete, the disposable capsule will fall off the wall of your esophagus within 5-10 days and pass naturally with your bowel movement through the digestive tract.  Once the recorder is returned, your provider will review and interpret your recordings and contact you to discuss your results.  This may take up to two weeks.   DO NOT have an MRI for 30 days after your procedure to ensure the capsule is no longer inside your body  It is imperative that you return the recorder on _________________________ by 3:00pm.  Your information must be downloaded at this time to obtain your results.   YOU HAD AN ENDOSCOPIC PROCEDURE TODAY AT [facility]:   Refer to the procedure report that was given to you for any specific questions about what was found during the examination.  If the procedure report does not answer your questions, please call your gastroenterologist to clarify.  If you requested that your care partner not be given the details of your procedure findings, then the procedure report has been included in a sealed envelope for you to review at your convenience later.  YOU SHOULD EXPECT: Some feelings of bloating in the abdomen. Passage of more gas than usual.  Walking can help get rid of the air that was put into your GI tract during the procedure and reduce the bloating. If you had a lower endoscopy (such as  a colonoscopy or flexible sigmoidoscopy) you may notice spotting of blood in your stool or on the toilet paper. If you underwent a bowel prep for your procedure, you may not have a normal bowel movement for a few days.  Please Note:  You might notice some irritation and congestion in your nose or some drainage.  This is from the oxygen used during your procedure.  There is no need for concern and it should clear up in a day or so.  SYMPTOMS TO REPORT IMMEDIATELY:  YOU HAD AN ENDOSCOPIC PROCEDURE TODAY AT [facility]:    Refer to the procedure report that was given to you for any specific questions about what was found during the examination.  If the procedure report does not answer your questions, please call your gastroenterologist to clarify.  If you requested that your care partner not be given the details of your procedure findings, then the procedure report has been included in a sealed envelope for you to review at your convenience later.  YOU SHOULD EXPECT: Some feelings of bloating in the abdomen. Passage of more gas than usual.  Walking can help get rid of the air that was put into your GI tract during the procedure and reduce the bloating. If you had a lower endoscopy (such as a colonoscopy or flexible sigmoidoscopy) you may notice spotting of blood in your stool or on the toilet paper. If you underwent a bowel prep for your procedure, you may not have a normal bowel movement for a few days.  Please Note:  You might notice some irritation and congestion in your nose or some drainage.  This is from the oxygen used during your procedure.  There is no need for concern and it should clear up in a day or so.  SYMPTOMS TO REPORT IMMEDIATELY:  Following lower endoscopy (colonoscopy or flexible sigmoidoscopy):  Excessive amounts of blood in the stool  Significant tenderness or worsening of abdominal pains  Swelling of the abdomen that is new, acute  Fever of 100F or higher  Following upper endoscopy (EGD)  Vomiting of blood or coffee ground material  New chest pain or pain under the shoulder blades  Painful or persistently difficult swallowing  New shortness of breath  Fever of 100F or higher  Black, tarry-looking stools  For urgent or emergent issues, a gastroenterologist can be reached at any hour by calling [phone]. Do not use MyChart messaging for urgent concerns.    DIET:  We do recommend a small meal at first, but then you may proceed to your regular diet.  Drink plenty of fluids but you  should avoid alcoholic beverages for 24 hours.  ACTIVITY:  You should plan to take it easy for the rest of today and you should NOT DRIVE or use heavy machinery until tomorrow (because of the sedation medicines used during the test).    FOLLOW UP: Our staff will call the number listed on your records 48-72 hours following your procedure to check on you and address any questions or concerns that you may have regarding the information given to you following your procedure. If we do not reach you, we will leave a message.  We will attempt to reach you two times.  During this call, we will ask if you have developed any symptoms of COVID 19. If you develop any symptoms (ie: fever, flu-like symptoms, shortness of breath, cough etc.) before then, please call [phone].  If you test positive for Covid  19 in the 2 weeks post procedure, please call and report this information to [place].    If any biopsies were taken you will be contacted by phone or by letter within the next 1-3 weeks.  Please call us at [phone] if you have not heard about the biopsies in 3 weeks.    SIGNATURES/CONFIDENTIALITY:  You and/or your care partner have signed paperwork which will be entered into your electronic medical record.  These signatures attest to the fact that that the information above on your After Visit Summary has been reviewed and is understood.  Full responsibility of the confidentiality of this discharge information lies with you and/or your care-partner.Following upper endoscopy (EGD)  RESUME PREVIOUS DIET  CONTINUE CURRENT MEDICATIONS  Vomiting of blood or coffee ground material  New chest pain or pain under the shoulder blades  Painful or persistently difficult swallowing  New shortness of breath  Fever of 100F or higher  Black, tarry-looking stools  For urgent or emergent issues, a gastroenterologist can be reached at any hour by calling [phone]. Do not use MyChart messaging for urgent  concerns.    DIET:  We do recommend a small meal at first, but then you may proceed to your regular diet.  Drink plenty of fluids but you should avoid alcoholic beverages for 24 hours.  ACTIVITY:  You should plan to take it easy for the rest of today and you should NOT DRIVE or use heavy machinery until tomorrow (because of the sedation medicines used during the test).    FOLLOW UP: Our staff will call the number listed on your records 48-72 hours following your procedure to check on you and address any questions or concerns that you may have regarding the information given to you following your procedure. If we do not reach you, we will leave a message.  We will attempt to reach you two times.  During this call, we will ask if you have developed any symptoms of COVID 19. If you develop any symptoms (ie: fever, flu-like symptoms, shortness of breath, cough etc.) before then, please call [phone].  If you test positive for Covid 19 in the 2 weeks post procedure, please call and report this information to [place].    If any biopsies were taken you will be contacted by phone or by letter within the next 1-3 weeks.  Please call us at [phone] if you have not heard about the biopsies in 3 weeks.    SIGNATURES/CONFIDENTIALITY: You and/or your care partner have signed paperwork which will be entered into your electronic medical record.  These signatures attest to the fact that that the information above on your After Visit Summary has been reviewed and is understood.  Full responsibility of the confidentiality of this discharge information lies with you and/or your care-partner.

## 2021-03-11 NOTE — Telephone Encounter (Signed)
Pt had EGD this morning with bravo capsule placement.  She went to lunch after and was unable to tolerate any liquids or food. Husband states that she only took very small sips and bites.  This pain has not improved while eating or drinking.  Husband states that she even has this pain with swallowing her saliva. According to her husband pt is rating her pain 12/10.  The pain does resolve when she is not swallowing.  Please advise.

## 2021-03-11 NOTE — Telephone Encounter (Signed)
Pts husband states that pain is in her chest

## 2021-03-15 ENCOUNTER — Other Ambulatory Visit: Payer: Self-pay

## 2021-03-15 ENCOUNTER — Ambulatory Visit (INDEPENDENT_AMBULATORY_CARE_PROVIDER_SITE_OTHER)
Admission: RE | Admit: 2021-03-15 | Discharge: 2021-03-15 | Disposition: A | Discharge status: Home / Self Care | Payer: Federal, State, Local not specified - PPO | Source: Ambulatory Visit | Attending: Gastroenterology | Admitting: Gastroenterology

## 2021-03-15 ENCOUNTER — Telehealth: Payer: Self-pay

## 2021-03-15 DIAGNOSIS — R0789 Other chest pain: Secondary | ICD-10-CM

## 2021-03-15 DIAGNOSIS — R079 Chest pain, unspecified: Secondary | ICD-10-CM | POA: Diagnosis not present

## 2021-03-15 MED ORDER — HYOSCYAMINE SULFATE 0.125 MG SL SUBL: 0.1250 mg | SUBLINGUAL_TABLET | SUBLINGUAL | 0 refills | Status: DC | PRN

## 2021-03-15 MED ORDER — NYSTATIN 100000 UNIT/ML MT SUSP: 5.0000 mL | OROMUCOSAL | 0 refills | Status: DC | PRN

## 2021-03-15 NOTE — Telephone Encounter (Signed)
  Complains of discomfort in the retrosternal area whenever she tries to eat or drink something  She does not have constant pain, usually has a spasm  when she tries to eat or drink She uses viscous lidocaine with no relief Nitroglycerin gave her headache  She is waiting to fill the prescription for Magic mouthwash with lidocaine  She ate 2 slices of pizza yesterday, she is feeling the intensity of pain is improving Will send prescription for hyoscyamine sublingual 1 to 2 tablets every 8 hours as needed  If continues to have persistent discomfort, plan to retrieve the Bravo sensor but at this point given its 3 days since placement we will continue to monitor and hold off repeat EGD  Reassured patient and advised her to call with any change in symptoms  K. Denzil Magnuson , MD [phone]

## 2021-03-15 NOTE — Telephone Encounter (Addendum)
Pamela Novak, Please ask Pamela Novak to come in for 2 view Chest X-ray to see if the bravo sensor is still attached? Send Rx for Magic mouthwash with lidocaine swish and swallow q4h prn If she continues to have discomfort, and X-ray confirms position will plan for removal of the sensor at [facility] unit. I can do the case tomorrow around lunch hour, I am currently overbooked and have no other availability.

## 2021-03-15 NOTE — Telephone Encounter (Signed)
  Follow up Call-  Call back number 03/11/2021 07/04/2018  Post procedure Call Back phone  # [phone]  Permission to leave phone message Yes Yes  Some recent data might be hidden     Patient questions:   Do you have a fever, pain , or abdominal swelling? Yes.   Pain Score  Hurt very bad did not get score. *  Have you tolerated food without any problems? No.  Have you been able to return to your normal activities? No.  Do you have any questions about your discharge instructions: Diet   No. Medications  No. Follow up visit  No.  Do you have questions or concerns about your Care? Yes.    Actions: * If pain score is 4 or above: No action needed, pain <4.  Patient coming in this morning to drop off Bravo machine. Dr. Silverio Decamp contacted with concerns of patients esophageal pain and spasm. Patient instructed to go to office after dropping off machine to see M.D. Complain of esophageal pain and spasm since Bravo procedure on Thursday. Call on call doctor and E.R. doctor.

## 2021-03-16 NOTE — Telephone Encounter (Signed)
Spoke with the patient. She feels the mouthwash was successful in making her more comfortable. She is complying with the diet and is able to swallow without pain. Overall she is feeling better today.

## 2021-03-16 NOTE — Telephone Encounter (Signed)
Glad to hear that she is feeling better

## 2021-03-18 ENCOUNTER — Encounter: Payer: Federal, State, Local not specified - PPO | Admitting: Physical Therapy

## 2021-03-18 DIAGNOSIS — N8110 Cystocele, unspecified: Secondary | ICD-10-CM | POA: Diagnosis not present

## 2021-03-24 DIAGNOSIS — M461 Sacroiliitis, not elsewhere classified: Secondary | ICD-10-CM | POA: Diagnosis not present

## 2021-03-26 ENCOUNTER — Encounter: Payer: Federal, State, Local not specified - PPO | Admitting: Physical Therapy

## 2021-03-30 DIAGNOSIS — F411 Generalized anxiety disorder: Secondary | ICD-10-CM | POA: Diagnosis not present

## 2021-03-30 DIAGNOSIS — F4310 Post-traumatic stress disorder, unspecified: Secondary | ICD-10-CM | POA: Diagnosis not present

## 2021-03-30 DIAGNOSIS — F332 Major depressive disorder, recurrent severe without psychotic features: Secondary | ICD-10-CM | POA: Diagnosis not present

## 2021-03-30 DIAGNOSIS — F900 Attention-deficit hyperactivity disorder, predominantly inattentive type: Secondary | ICD-10-CM | POA: Diagnosis not present

## 2021-04-07 ENCOUNTER — Encounter: Payer: Federal, State, Local not specified - PPO | Admitting: Physical Therapy

## 2021-04-13 DIAGNOSIS — F411 Generalized anxiety disorder: Secondary | ICD-10-CM | POA: Diagnosis not present

## 2021-04-13 DIAGNOSIS — F332 Major depressive disorder, recurrent severe without psychotic features: Secondary | ICD-10-CM | POA: Diagnosis not present

## 2021-04-13 DIAGNOSIS — F900 Attention-deficit hyperactivity disorder, predominantly inattentive type: Secondary | ICD-10-CM | POA: Diagnosis not present

## 2021-04-13 DIAGNOSIS — F4310 Post-traumatic stress disorder, unspecified: Secondary | ICD-10-CM | POA: Diagnosis not present

## 2021-04-15 ENCOUNTER — Ambulatory Visit: Payer: Federal, State, Local not specified - PPO | Admitting: Physical Therapy

## 2021-04-22 ENCOUNTER — Encounter: Payer: Federal, State, Local not specified - PPO | Admitting: Physical Therapy

## 2021-04-27 DIAGNOSIS — F411 Generalized anxiety disorder: Secondary | ICD-10-CM | POA: Diagnosis not present

## 2021-04-27 DIAGNOSIS — F4310 Post-traumatic stress disorder, unspecified: Secondary | ICD-10-CM | POA: Diagnosis not present

## 2021-04-27 DIAGNOSIS — F900 Attention-deficit hyperactivity disorder, predominantly inattentive type: Secondary | ICD-10-CM | POA: Diagnosis not present

## 2021-04-27 DIAGNOSIS — F332 Major depressive disorder, recurrent severe without psychotic features: Secondary | ICD-10-CM | POA: Diagnosis not present

## 2021-04-29 ENCOUNTER — Encounter: Payer: Federal, State, Local not specified - PPO | Admitting: Physical Therapy

## 2021-05-04 ENCOUNTER — Ambulatory Visit: Payer: Federal, State, Local not specified - PPO | Admitting: Physician Assistant

## 2021-05-04 DIAGNOSIS — F332 Major depressive disorder, recurrent severe without psychotic features: Secondary | ICD-10-CM | POA: Diagnosis not present

## 2021-05-04 DIAGNOSIS — F4310 Post-traumatic stress disorder, unspecified: Secondary | ICD-10-CM | POA: Diagnosis not present

## 2021-05-04 DIAGNOSIS — F900 Attention-deficit hyperactivity disorder, predominantly inattentive type: Secondary | ICD-10-CM | POA: Diagnosis not present

## 2021-05-04 DIAGNOSIS — F411 Generalized anxiety disorder: Secondary | ICD-10-CM | POA: Diagnosis not present

## 2021-05-14 DIAGNOSIS — F332 Major depressive disorder, recurrent severe without psychotic features: Secondary | ICD-10-CM | POA: Diagnosis not present

## 2021-05-20 ENCOUNTER — Other Ambulatory Visit: Payer: Self-pay

## 2021-05-20 ENCOUNTER — Encounter: Payer: Self-pay | Admitting: Physician Assistant

## 2021-05-20 ENCOUNTER — Ambulatory Visit: Payer: Federal, State, Local not specified - PPO | Admitting: Physician Assistant

## 2021-05-20 DIAGNOSIS — F411 Generalized anxiety disorder: Secondary | ICD-10-CM | POA: Diagnosis not present

## 2021-05-20 DIAGNOSIS — Z1283 Encounter for screening for malignant neoplasm of skin: Secondary | ICD-10-CM

## 2021-05-20 DIAGNOSIS — F332 Major depressive disorder, recurrent severe without psychotic features: Secondary | ICD-10-CM | POA: Diagnosis not present

## 2021-05-20 DIAGNOSIS — L739 Follicular disorder, unspecified: Secondary | ICD-10-CM | POA: Diagnosis not present

## 2021-05-20 DIAGNOSIS — F4310 Post-traumatic stress disorder, unspecified: Secondary | ICD-10-CM | POA: Diagnosis not present

## 2021-05-20 DIAGNOSIS — F900 Attention-deficit hyperactivity disorder, predominantly inattentive type: Secondary | ICD-10-CM | POA: Diagnosis not present

## 2021-05-20 DIAGNOSIS — B078 Other viral warts: Secondary | ICD-10-CM | POA: Diagnosis not present

## 2021-05-20 MED ORDER — TRIAMCINOLONE ACETONIDE 0.1 % EX CREA: 1.0000 "application " | TOPICAL_CREAM | Freq: Two times a day (BID) | CUTANEOUS | 3 refills | Status: DC | PRN

## 2021-05-21 ENCOUNTER — Telehealth: Payer: Self-pay | Admitting: Gastroenterology

## 2021-05-21 DIAGNOSIS — K645 Perianal venous thrombosis: Secondary | ICD-10-CM | POA: Diagnosis not present

## 2021-05-21 NOTE — Telephone Encounter (Signed)
Attempt to contact patient, voicemail box not set up. Will attempt call at another time.

## 2021-05-21 NOTE — Progress Notes (Signed)
   New Patient   Subjective  Pamela Novak is a 50 y.o. female who presents for the following: Annual Exam (New lesions on face- thinks its warts. No personal history of melanoma or non mole skin cancer. Patient does have a family history of non mole skin cancers. ).   The following portions of the chart were reviewed this encounter and updated as appropriate:  Tobacco  Allergies  Meds  Problems  Med Hx  Surg Hx  Fam Hx      Objective  Well appearing patient in no apparent distress; mood and affect are within normal limits.  A full examination was performed including scalp, head, eyes, ears, nose, lips, neck, chest, axillae, abdomen, back, buttocks, bilateral upper extremities, bilateral lower extremities, hands, feet, fingers, toes, fingernails, and toenails. All findings within normal limits unless otherwise noted below.  Right Thigh - Anterior, both buttocks Perifollicular erythematous papules and pustules. Numerous excoriations and persistent pink papules due admittedly to picking.  left upper lip Hyperpigmented plaque.   Assessment & Plan  Folliculitis both buttocks; Right Thigh - Anterior  triamcinolone cream (KENALOG) 0.1 % - Right Thigh - Anterior, both buttocks Apply 1 application topically 2 (two) times daily as needed.  Other viral warts left upper lip  Destruction of lesion - left upper lip Complexity: simple   Destruction method: cryotherapy   Informed consent: discussed and consent obtained   Timeout:  patient name, date of birth, surgical site, and procedure verified Lesion destroyed using liquid nitrogen: Yes   Cryotherapy cycles:  3 Outcome: patient tolerated procedure well with no complications       I, Nollie Shiflett, PA-C, have reviewed all documentation's for this visit.  The documentation on 05/21/21 for the exam, diagnosis, procedures and orders are all accurate and complete.

## 2021-05-21 NOTE — Telephone Encounter (Signed)
Attempted to contact patient, no answer LMTCB 

## 2021-05-21 NOTE — Telephone Encounter (Signed)
Patient calling to inform she is experiencing rectum pain. Pt wants to know how to treat sxs.. Plz advise    Thank you

## 2021-05-21 NOTE — Telephone Encounter (Signed)
Inbound call from pt stating that she is returning a phone call.

## 2021-05-24 NOTE — Telephone Encounter (Signed)
Spoke with patient, patient states problem has resolved from our office standpoint. Nothing further needed at this time.

## 2021-05-25 ENCOUNTER — Encounter: Payer: Self-pay | Admitting: Internal Medicine

## 2021-05-25 NOTE — Telephone Encounter (Signed)
Pt states it is a ball that tries to "come out" but goes back in. States she is unsure if she has  internal hemorrhoids, internal tissue complication, or collapsed vein. Also stated [facility] advised her 7/21 that they would not do surgery d/t fear of complications and it being that we were going into the weekend.

## 2021-05-25 NOTE — Telephone Encounter (Signed)
Unclear what the large ball is based on msg? Please clarify if is she having fecal impaction  or is she feeling in lump in the rectum. If she is having significant pain, may need to come to ER for pain management.

## 2021-05-25 NOTE — Telephone Encounter (Signed)
Spoke wwith patient. Patient states she has "very large painful "ball" in her rectum" states it is very painful. Patient would like to know what she can do about this

## 2021-05-25 NOTE — Telephone Encounter (Signed)
Patient made aware of rec's. Patient scheduled with Dr Silverio Decamp 8/24 '@9'$ :50 Nothing further at this time.

## 2021-05-25 NOTE — Telephone Encounter (Signed)
Please advise her to do sitz bath.  Please send prescription for Anusol cream twice daily per rectum, advised patient to use small pea-sized amount per rectum twice daily for 7 days.  Schedule follow-up office visit with app soon, next available.  If her symptoms worsen in the interim, she will need to come to the ER.  Thank you

## 2021-05-27 ENCOUNTER — Other Ambulatory Visit: Payer: Self-pay | Admitting: Gastroenterology

## 2021-05-27 DIAGNOSIS — F900 Attention-deficit hyperactivity disorder, predominantly inattentive type: Secondary | ICD-10-CM | POA: Diagnosis not present

## 2021-05-27 DIAGNOSIS — F4310 Post-traumatic stress disorder, unspecified: Secondary | ICD-10-CM | POA: Diagnosis not present

## 2021-05-27 DIAGNOSIS — F332 Major depressive disorder, recurrent severe without psychotic features: Secondary | ICD-10-CM | POA: Diagnosis not present

## 2021-05-27 DIAGNOSIS — F411 Generalized anxiety disorder: Secondary | ICD-10-CM | POA: Diagnosis not present

## 2021-05-27 NOTE — Progress Notes (Signed)
Subjective:    Patient ID: Pamela Novak, female    DOB: 1971-10-14, 50 y.o.   MRN: [id number]   This visit occurred during the SARS-CoV-2 public health emergency.  Safety protocols were in place, including screening questions prior to the visit, additional usage of staff PPE, and extensive cleaning of exam room while observing appropriate contact time as indicated for disinfecting solutions.    HPI She is here for a physical exam.    For TAH next month.  She is very anxious about the surgery.    Medications and allergies reviewed with patient and updated if appropriate.  Patient Active Problem List   Diagnosis Date Noted   Throat irritation 11/26/2020   Acute sinus infection 11/09/2020   S/P Nissen fundoplication (without gastrostomy tube) procedure 09/05/2019   LLQ pain 06/14/2019   Rib pain 05/22/2019   Chronic constipation 11/16/2018   Neck pain 09/21/2018   Migraine without aura and with status migrainosus, not intractable 04/12/2018   Bilateral low back pain 03/21/2018   Lumbar disc herniation with chronic lower back pain 03/21/2018   Prediabetes 03/23/2017   Seborrheic dermatitis of scalp 03/23/2017   Hyperlipidemia 01/21/2016   Depression 01/18/2016   PTSD (post-traumatic stress disorder) 01/18/2016   GERD (gastroesophageal reflux disease) 01/18/2016   Cough 11/11/2015   Varicose veins 05/21/2013   GAD (generalized anxiety disorder) 11/02/2012    Current Outpatient Medications on File Prior to Visit  Medication Sig Dispense Refill   ALPRAZolam (XANAX) 0.5 MG tablet Take 0.5 mg by mouth 2 (two) times daily as needed for anxiety or sleep.     amphetamine-dextroamphetamine (ADDERALL) 30 MG tablet Take 1 tablet by mouth 2 (two) times daily.     ARIPiprazole (ABILIFY) 5 MG tablet Take 5 mg by mouth daily.     atorvastatin (LIPITOR) 20 MG tablet TAKE 1 TABLET BY MOUTH EVERY DAY 90 tablet 1   buPROPion (WELLBUTRIN XL) 300 MG 24 hr tablet Take 300 mg by mouth  daily.      busPIRone (BUSPAR) 30 MG tablet Take 30 mg by mouth 2 (two) times daily.     clotrimazole (MYCELEX) 10 MG troche Take 1 tablet (10 mg total) by mouth 4 (four) times daily as needed. Clotrimazole lozenges 10 mg one every 4-6 hours as needed 70 tablet 0   Dexlansoprazole (DEXILANT) 30 MG capsule Take 1 capsule (30 mg total) by mouth daily. 30 capsule 3   DULoxetine (CYMBALTA) 60 MG capsule Take 120 mg by mouth daily.      EPINEPHrine 0.3 mg/0.3 mL IJ SOAJ injection Inject 0.3 mg into the muscle as needed for anaphylaxis.     gabapentin (NEURONTIN) 100 MG capsule TAKE 1 CAPSULE BY MOUTH AT BEDTIME. INCREASE UP TO 300 MG AT NIGHT IF NEEDED 90 capsule 0   hydrocortisone (ANUSOL-HC) 2.5 % rectal cream PLACE 1 APPLICATION RECTALLY 2 (TWO) TIMES DAILY. 30 g 1   hydrocortisone (ANUSOL-HC) 25 MG suppository PLACE 1 SUPPOSITORY (25 MG TOTAL) RECTALLY DAILY AS NEEDED FOR HEMORRHOIDS. 12 suppository 5   ketoconazole (NIZORAL) 2 % shampoo APPLY 1 APPLICATION TOPICALLY 2 (TWO) TIMES A WEEK. 120 mL 5   lubiprostone (AMITIZA) 8 MCG capsule TAKE 1 CAPSULE BY MOUTH 2 (TWO) TIMES DAILY WITH A MEAL. 60 capsule 3   nitroGLYCERIN (NITROSTAT) 0.4 MG SL tablet 0.4 mg 3 (three) times daily as needed.     ondansetron (ZOFRAN) 4 MG tablet Take 1 tablet (4 mg total) by mouth every 8 (eight) hours  as needed for nausea or vomiting. 30 tablet 0   polyethylene glycol powder (GLYCOLAX/MIRALAX) 17 GM/SCOOP powder Take half capful daily and titrate based on response to have 1-2 soft bowel movements daily. 255 g 1   SUMAtriptan (IMITREX) 20 MG/ACT nasal spray PLACE 1 SPRAY INTO THE NOSE EVERY 2 HOURS AS NEEDED FOR MIGRAINE. MAY REPEAT IN 2 HRS IF HEADACHE PERSISTS OR RECURS. 6 each 5   traZODone (DESYREL) 50 MG tablet Take 50-100 mg by mouth at bedtime.     triamcinolone (KENALOG) 0.1 % paste Use as directed 1 application in the mouth or throat 2 (two) times daily. 5 g 12   triamcinolone cream (KENALOG) 0.1 % Apply 1  application topically 2 (two) times daily as needed. 80 g 3   VYVANSE 70 MG capsule Take 70 mg by mouth at bedtime.     zolmitriptan (ZOMIG) 5 MG tablet TAKE 1 TABLET BY MOUTH AS NEEDED FOR MIGRAINE. 10 tablet 0   No current facility-administered medications on file prior to visit.    Past Medical History:  Diagnosis Date   ADHD    Allergy    Anxiety    severe   Arthritis    Asthma    secondary to GERD, no current issues   Blood in stool    Breast hematoma    Complication of anesthesia    "woke up during colonoscopy" age 33 years old   Constipation    Depression    Diverticulosis    GERD (gastroesophageal reflux disease)    Hemorrhoids    History of colon polyps    Hyperlipidemia    IBS (irritable bowel syndrome)    Migraines    Post traumatic stress disorder (PTSD)    Pre-diabetes    Restless legs syndrome (RLS)    Sciatica    Right side   Traumatic hematoma of knee    Varicose veins of lower extremity    vagina    Past Surgical History:  Procedure Laterality Date   37 HOUR [place] STUDY N/A 04/01/2019   Procedure: 24 HOUR [place] STUDY;  Surgeon: Mauri Pole, MD;  Location: [facility];  Service: Endoscopy;  Laterality: N/A;   ABLATION ON ENDOMETRIOSIS  10/04/2012   Procedure: ABLATION ON ENDOMETRIOSIS;  Surgeon: Linda Hedges, DO;  Location: [facility];  Service: Gynecology;  Laterality: N/A;   COLONOSCOPY     CONDYLOMA EXCISION/FULGURATION  10/04/2012   Procedure: CONDYLOMA REMOVAL;  Surgeon: Linda Hedges, DO;  Location: [facility];  Service: Gynecology;  Laterality: N/A;   DILITATION & CURRETTAGE/HYSTROSCOPY WITH NOVASURE ABLATION  10/04/2012   Procedure: DILATATION & CURETTAGE/HYSTEROSCOPY WITH NOVASURE ABLATION;  Surgeon: Linda Hedges, DO;  Location: [facility];  Service: Gynecology;  Laterality: N/A;   ESOPHAGEAL MANOMETRY N/A 04/01/2019   Procedure: ESOPHAGEAL MANOMETRY (EM);  Surgeon: Mauri Pole, MD;  Location: [facility];  Service: Endoscopy;  Laterality: N/A;    HEMORRHOID BANDING     LAPAROSCOPIC NISSEN FUNDOPLICATION N/A [id number]   Procedure: LAPAROSCOPIC HIATAL HERNIA REPAIR AND NISSEN FUNDOPLICATION;  Surgeon: [name], MD;  Location: [facility];  Service: General;  Laterality: N/A;   LAPAROSCOPY  10/04/2012   Procedure: LAPAROSCOPY OPERATIVE;  Surgeon: Linda Hedges, DO;  Location: [facility];  Service: Gynecology;  Laterality: N/A;   MASS EXCISION Bilateral 09/05/2019   Procedure: EXCISION BILATERAL SOFT TISSUE MASSES, THIGH;  Surgeon: [name], MD;  Location: [facility];  Service: General;  Laterality: Bilateral;   [facility]      Social History   Socioeconomic History   Marital status: Married    Spouse name: [name]   Number of children: 2   Years of education: 12+   Highest education level: Not on file  Occupational History   Occupation: adm Radiation protection practitioner: [employer]  Tobacco Use   Smoking status: Never   Smokeless tobacco: Never  Vaping Use   Vaping Use: Never used  Substance and Sexual Activity   Alcohol use: Yes    Comment: rare   Drug use: No   Sexual activity: Yes  Other Topics Concern   Not on file  Social History Narrative   Regular exercise-no   Caffeine Use-yes   Social Determinants of Health   Financial Resource Strain: Not on file  Food Insecurity: Not on file  Transportation Needs: Not on file  Physical Activity: Not on file  Stress: Not on file  Social Connections: Not on file    Family History  Problem Relation Age of Onset   Heart disease Mother    Arthritis Mother    Hyperlipidemia Mother    Hypertension Mother    Heart attack Mother    Colon polyps Father    Hashimoto's thyroiditis Sister    Diabetes Sister    Thyroid disease Sister    Colon cancer Paternal Grandfather    Thyroid disease Sister     Review of Systems  Constitutional:  Negative for fever.  Eyes:  Negative for visual disturbance.  Respiratory:  Positive for cough.  Negative for shortness of breath and wheezing.   Cardiovascular:  Positive for palpitations (with anxiety). Negative for chest pain and leg swelling.  Gastrointestinal:  Positive for constipation, diarrhea (intermittent) and nausea (for a couple of months - likely anxiety related). Negative for abdominal pain and blood in stool.  Genitourinary:  Negative for dysuria.  Musculoskeletal:  Positive for back pain and neck pain.  Skin:  Negative for color change and rash.  Neurological:  Positive for light-headedness (occ) and headaches (occ).  Psychiatric/Behavioral:  Positive for dysphoric mood. The patient is nervous/anxious.       Objective:   Vitals:   05/28/21 0826  BP: 130/76  Pulse: 99  Temp: 98.5 F (36.9 C)  SpO2: 99%   Filed Weights   05/28/21 0826  Weight: 168 lb (76.2 kg)   Body mass index is 26.31 kg/m.  BP Readings from Last 3 Encounters:  05/28/21 130/76  03/11/21 132/84  01/14/21 140/82    Wt Readings from Last 3 Encounters:  05/28/21 168 lb (76.2 kg)  03/11/21 161 lb (73 kg)  01/14/21 161 lb (73 kg)     Physical Exam Constitutional: She appears well-developed and well-nourished. No distress.  HENT:  Head: Normocephalic and atraumatic.  Right Ear: External ear normal. Normal ear canal and TM Left Ear: External ear normal.  Normal ear canal and TM Mouth/Throat: Oropharynx is clear and moist.  Eyes: Conjunctivae and EOM are normal.  Neck: Neck supple. No tracheal deviation present. No thyromegaly present.  No carotid bruit  Cardiovascular: Normal rate, regular rhythm and normal heart sounds.   No murmur heard.  No edema. Pulmonary/Chest: Effort normal and breath sounds normal. No respiratory distress. She has no wheezes. She has no rales.  Breast: deferred   Abdominal: Soft. She exhibits no distension. There is no tenderness.  Lymphadenopathy: She has no cervical adenopathy.  Skin: Skin is warm and dry. She is not diaphoretic.  Psychiatric: She has a  normal mood and affect. Her behavior is normal.        Assessment & Plan:   Physical exam: Screening blood work  ordered Exercise  none - due to back pain Weight  ok for age Substance abuse  none   Needs shingrix #2 - she will schedule - not sure I fshe had 1 or 2  Health Maintenance  Topic Date Due   COVID-19 Vaccine (4 - Booster for Pfizer series) 06/29/2020   Zoster Vaccines- Shingrix (2 of 2) 07/21/2020   INFLUENZA VACCINE  05/31/2021   MAMMOGRAM  11/26/2022   COLONOSCOPY (Pts 45-60yr Insurance coverage will need to be confirmed)  07/18/2023   PAP SMEAR-Modifier  11/27/2023   TETANUS/TDAP  01/17/2026   Hepatitis C Screening  Completed   HIV Screening  Completed   Pneumococcal Vaccine [id number] old  Aged Out   HPV VACCINES  Aged Out        See Problem List for Assessment and Plan of chronic medical problems.

## 2021-05-27 NOTE — Patient Instructions (Addendum)
Blood work was ordered.     Medications changes include :   none     Please followup in 6 months    Health Maintenance, Female Adopting a healthy lifestyle and getting preventive care are important in promoting health and wellness. Ask your health care provider about: The right schedule for you to have regular tests and exams. Things you can do on your own to prevent diseases and keep yourself healthy. What should I know about diet, weight, and exercise? Eat a healthy diet  Eat a diet that includes plenty of vegetables, fruits, low-fat dairy products, and lean protein. Do not eat a lot of foods that are high in solid fats, added sugars, or sodium.  Maintain a healthy weight Body mass index (BMI) is used to identify weight problems. It estimates body fat based on height and weight. Your health care provider can help determineyour BMI and help you achieve or maintain a healthy weight. Get regular exercise Get regular exercise. This is one of the most important things you can do for your health. Most adults should: Exercise for at least 150 minutes each week. The exercise should increase your heart rate and make you sweat (moderate-intensity exercise). Do strengthening exercises at least twice a week. This is in addition to the moderate-intensity exercise. Spend less time sitting. Even light physical activity can be beneficial. Watch cholesterol and blood lipids Have your blood tested for lipids and cholesterol at 52 years of age, then havethis test every 5 years. Have your cholesterol levels checked more often if: Your lipid or cholesterol levels are high. You are older than 52 years of age. You are at high risk for heart disease. What should I know about cancer screening? Depending on your health history and family history, you may need to have cancer screening at various ages. This may include screening for: Breast cancer. Cervical cancer. Colorectal cancer. Skin  cancer. Lung cancer. What should I know about heart disease, diabetes, and high blood pressure? Blood pressure and heart disease High blood pressure causes heart disease and increases the risk of stroke. This is more likely to develop in people who have high blood pressure readings, are of African descent, or are overweight. Have your blood pressure checked: Every 3-5 years if you are 18-39 years of age. Every year if you are 40 years old or older. Diabetes Have regular diabetes screenings. This checks your fasting blood sugar level. Have the screening done: Once every three years after age 40 if you are at a normal weight and have a low risk for diabetes. More often and at a younger age if you are overweight or have a high risk for diabetes. What should I know about preventing infection? Hepatitis B If you have a higher risk for hepatitis B, you should be screened for this virus. Talk with your health care provider to find out if you are at risk forhepatitis B infection. Hepatitis C Testing is recommended for: Everyone born from 1945 through 1965. Anyone with known risk factors for hepatitis C. Sexually transmitted infections (STIs) Get screened for STIs, including gonorrhea and chlamydia, if: You are sexually active and are younger than 52 years of age. You are older than 52 years of age and your health care provider tells you that you are at risk for this type of infection. Your sexual activity has changed since you were last screened, and you are at increased risk for chlamydia or gonorrhea. Ask your health care provider if you are   at risk. Ask your health care provider about whether you are at high risk for HIV. Your health care provider may recommend a prescription medicine to help prevent HIV infection. If you choose to take medicine to prevent HIV, you should first get tested for HIV. You should then be tested every 3 months for as long as you are taking the medicine. Pregnancy If  you are about to stop having your period (premenopausal) and you may become pregnant, seek counseling before you get pregnant. Take 400 to 800 micrograms (mcg) of folic acid every day if you become pregnant. Ask for birth control (contraception) if you want to prevent pregnancy. Osteoporosis and menopause Osteoporosis is a disease in which the bones lose minerals and strength with aging. This can result in bone fractures. If you are 65 years old or older, or if you are at risk for osteoporosis and fractures, ask your health care provider if you should: Be screened for bone loss. Take a calcium or vitamin D supplement to lower your risk of fractures. Be given hormone replacement therapy (HRT) to treat symptoms of menopause. Follow these instructions at home: Lifestyle Do not use any products that contain nicotine or tobacco, such as cigarettes, e-cigarettes, and chewing tobacco. If you need help quitting, ask your health care provider. Do not use street drugs. Do not share needles. Ask your health care provider for help if you need support or information about quitting drugs. Alcohol use Do not drink alcohol if: Your health care provider tells you not to drink. You are pregnant, may be pregnant, or are planning to become pregnant. If you drink alcohol: Limit how much you use to 0-1 drink a day. Limit intake if you are breastfeeding. Be aware of how much alcohol is in your drink. In the [place] one drink equals one 12 oz bottle of beer (355 mL), one 5 oz glass of wine (148 mL), or one 1 oz glass of hard liquor (44 mL). General instructions Schedule regular health, dental, and eye exams. Stay current with your vaccines. Tell your health care provider if: You often feel depressed. You have ever been abused or do not feel safe at home. Summary Adopting a healthy lifestyle and getting preventive care are important in promoting health and wellness. Follow your health care provider's  instructions about healthy diet, exercising, and getting tested or screened for diseases. Follow your health care provider's instructions on monitoring your cholesterol and blood pressure. This information is not intended to replace advice given to you by your health care provider. Make sure you discuss any questions you have with your healthcare provider. Document Revised: 10/10/2018 Document Reviewed: 10/10/2018 Elsevier Patient Education  2022 Elsevier Inc.  

## 2021-05-28 ENCOUNTER — Encounter: Payer: Self-pay | Admitting: Internal Medicine

## 2021-05-28 ENCOUNTER — Other Ambulatory Visit: Payer: Self-pay

## 2021-05-28 ENCOUNTER — Ambulatory Visit (INDEPENDENT_AMBULATORY_CARE_PROVIDER_SITE_OTHER): Payer: Federal, State, Local not specified - PPO | Admitting: Internal Medicine

## 2021-05-28 VITALS — BP 130/76 | HR 99 | Temp 98.5°F | Ht 67.0 in | Wt 168.0 lb

## 2021-05-28 DIAGNOSIS — E7849 Other hyperlipidemia: Secondary | ICD-10-CM | POA: Diagnosis not present

## 2021-05-28 DIAGNOSIS — R7303 Prediabetes: Secondary | ICD-10-CM

## 2021-05-28 DIAGNOSIS — G43001 Migraine without aura, not intractable, with status migrainosus: Secondary | ICD-10-CM | POA: Diagnosis not present

## 2021-05-28 DIAGNOSIS — Z0000 Encounter for general adult medical examination without abnormal findings: Secondary | ICD-10-CM

## 2021-05-28 DIAGNOSIS — M545 Low back pain, unspecified: Secondary | ICD-10-CM

## 2021-05-28 DIAGNOSIS — G8929 Other chronic pain: Secondary | ICD-10-CM

## 2021-05-28 DIAGNOSIS — M542 Cervicalgia: Secondary | ICD-10-CM

## 2021-05-28 LAB — CBC WITH DIFFERENTIAL/PLATELET
Basophils Absolute: 0 10*3/uL (ref 0.0–0.1)
Basophils Relative: 0.7 % (ref 0.0–3.0)
Eosinophils Absolute: 0.1 10*3/uL (ref 0.0–0.7)
Eosinophils Relative: 1.3 % (ref 0.0–5.0)
HCT: 41.6 % (ref 36.0–46.0)
Hemoglobin: 13.8 g/dL (ref 12.0–15.0)
Lymphocytes Relative: 14.1 % (ref 12.0–46.0)
Lymphs Abs: 0.9 10*3/uL (ref 0.7–4.0)
MCHC: 33.2 g/dL (ref 30.0–36.0)
MCV: 88.4 fl (ref 78.0–100.0)
Monocytes Absolute: 0.6 10*3/uL (ref 0.1–1.0)
Monocytes Relative: 9.1 % (ref 3.0–12.0)
Neutro Abs: 4.8 10*3/uL (ref 1.4–7.7)
Neutrophils Relative %: 74.8 % (ref 43.0–77.0)
Platelets: 235 10*3/uL (ref 150.0–400.0)
RBC: 4.7 Mil/uL (ref 3.87–5.11)
RDW: 13.8 % (ref 11.5–15.5)
WBC: 6.5 10*3/uL (ref 4.0–10.5)

## 2021-05-28 LAB — COMPREHENSIVE METABOLIC PANEL
ALT: 32 U/L (ref 0–35)
AST: 25 U/L (ref 0–37)
Albumin: 4.3 g/dL (ref 3.5–5.2)
Alkaline Phosphatase: 62 U/L (ref 39–117)
BUN: 16 mg/dL (ref 6–23)
CO2: 33 mEq/L — ABNORMAL HIGH (ref 19–32)
Calcium: 9.8 mg/dL (ref 8.4–10.5)
Chloride: 103 mEq/L (ref 96–112)
Creatinine, Ser: 0.95 mg/dL (ref 0.40–1.20)
GFR: 69.27 mL/min (ref >60.00)
Glucose, Bld: 103 mg/dL — ABNORMAL HIGH (ref 70–99)
Potassium: 4.6 mEq/L (ref 3.5–5.1)
Sodium: 141 mEq/L (ref 135–145)
Total Bilirubin: 0.4 mg/dL (ref 0.2–1.2)
Total Protein: 6.8 g/dL (ref 6.0–8.3)

## 2021-05-28 LAB — LIPID PANEL
Cholesterol: 211 mg/dL — ABNORMAL HIGH (ref 0–200)
HDL: 65.8 mg/dL (ref >39.00)
LDL Cholesterol: 123 mg/dL — ABNORMAL HIGH (ref 0–99)
NonHDL: 145.5
Total CHOL/HDL Ratio: 3
Triglycerides: 114 mg/dL (ref 0.0–149.0)
VLDL: 22.8 mg/dL (ref 0.0–40.0)

## 2021-05-28 LAB — HEMOGLOBIN A1C: Hgb A1c MFr Bld: 6.1 % (ref 4.6–6.5)

## 2021-05-28 LAB — TSH: TSH: 2.11 u[IU]/mL (ref 0.35–5.50)

## 2021-05-28 MED ORDER — SUMATRIPTAN 20 MG/ACT NA SOLN: NASAL | 5 refills | Status: DC

## 2021-05-28 NOTE — Addendum Note (Signed)
Addended by: Boris Lown B on: 05/28/2021 09:32 AM   Modules accepted: Orders

## 2021-05-28 NOTE — Assessment & Plan Note (Signed)
Chronic Check lipid panel  Continue atorvastatin 20 mg daily Regular exercise and healthy diet encouraged  

## 2021-05-28 NOTE — Assessment & Plan Note (Signed)
Chronic Following pain management - getting injections

## 2021-05-28 NOTE — Assessment & Plan Note (Signed)
Chronic Check a1c Low sugar / carb diet Stressed regular exercise  

## 2021-05-28 NOTE — Assessment & Plan Note (Signed)
Chronic Intermittent Continue imitrex nasal spray prn or zomig 5 mg prn for migraine continue zofran 4 mg prn for nausea

## 2021-05-28 NOTE — Assessment & Plan Note (Signed)
Chronic Continue gabapentin 100-300 mg HS prn

## 2021-05-28 NOTE — Telephone Encounter (Signed)
Dr Silverio Decamp this patient is requesting this refill is this ok?   clotrimazole

## 2021-05-31 ENCOUNTER — Encounter: Payer: Self-pay | Admitting: Internal Medicine

## 2021-05-31 MED ORDER — ZOLMITRIPTAN 5 MG NA SOLN: 1.0000 | NASAL | 0 refills | Status: DC | PRN

## 2021-06-09 DIAGNOSIS — Z20822 Contact with and (suspected) exposure to covid-19: Secondary | ICD-10-CM | POA: Diagnosis not present

## 2021-06-09 DIAGNOSIS — F411 Generalized anxiety disorder: Secondary | ICD-10-CM | POA: Diagnosis not present

## 2021-06-09 DIAGNOSIS — F900 Attention-deficit hyperactivity disorder, predominantly inattentive type: Secondary | ICD-10-CM | POA: Diagnosis not present

## 2021-06-09 DIAGNOSIS — F332 Major depressive disorder, recurrent severe without psychotic features: Secondary | ICD-10-CM | POA: Diagnosis not present

## 2021-06-09 DIAGNOSIS — F4310 Post-traumatic stress disorder, unspecified: Secondary | ICD-10-CM | POA: Diagnosis not present

## 2021-06-09 DIAGNOSIS — Z20828 Contact with and (suspected) exposure to other viral communicable diseases: Secondary | ICD-10-CM | POA: Diagnosis not present

## 2021-06-16 DIAGNOSIS — F900 Attention-deficit hyperactivity disorder, predominantly inattentive type: Secondary | ICD-10-CM | POA: Diagnosis not present

## 2021-06-16 DIAGNOSIS — F4310 Post-traumatic stress disorder, unspecified: Secondary | ICD-10-CM | POA: Diagnosis not present

## 2021-06-16 DIAGNOSIS — F332 Major depressive disorder, recurrent severe without psychotic features: Secondary | ICD-10-CM | POA: Diagnosis not present

## 2021-06-16 DIAGNOSIS — F411 Generalized anxiety disorder: Secondary | ICD-10-CM | POA: Diagnosis not present

## 2021-06-18 DIAGNOSIS — B89 Unspecified parasitic disease: Secondary | ICD-10-CM | POA: Diagnosis not present

## 2021-06-23 ENCOUNTER — Ambulatory Visit: Payer: Federal, State, Local not specified - PPO | Admitting: Gastroenterology

## 2021-06-23 ENCOUNTER — Other Ambulatory Visit: Payer: Self-pay

## 2021-06-23 ENCOUNTER — Encounter (HOSPITAL_BASED_OUTPATIENT_CLINIC_OR_DEPARTMENT_OTHER): Payer: Self-pay | Admitting: Obstetrics & Gynecology

## 2021-06-23 ENCOUNTER — Encounter: Payer: Self-pay | Admitting: Gastroenterology

## 2021-06-23 VITALS — BP 128/74 | HR 94 | Ht 67.0 in | Wt 167.2 lb

## 2021-06-23 DIAGNOSIS — R140 Abdominal distension (gaseous): Secondary | ICD-10-CM

## 2021-06-23 DIAGNOSIS — K219 Gastro-esophageal reflux disease without esophagitis: Secondary | ICD-10-CM | POA: Diagnosis not present

## 2021-06-23 DIAGNOSIS — K589 Irritable bowel syndrome without diarrhea: Secondary | ICD-10-CM

## 2021-06-23 DIAGNOSIS — R142 Eructation: Secondary | ICD-10-CM

## 2021-06-23 DIAGNOSIS — N8110 Cystocele, unspecified: Secondary | ICD-10-CM | POA: Diagnosis not present

## 2021-06-23 DIAGNOSIS — Z789 Other specified health status: Secondary | ICD-10-CM | POA: Diagnosis not present

## 2021-06-23 DIAGNOSIS — L739 Follicular disorder, unspecified: Secondary | ICD-10-CM

## 2021-06-23 DIAGNOSIS — Z09 Encounter for follow-up examination after completed treatment for conditions other than malignant neoplasm: Secondary | ICD-10-CM

## 2021-06-23 DIAGNOSIS — Z973 Presence of spectacles and contact lenses: Secondary | ICD-10-CM

## 2021-06-23 DIAGNOSIS — F458 Other somatoform disorders: Secondary | ICD-10-CM

## 2021-06-23 HISTORY — DX: Follicular disorder, unspecified: L73.9

## 2021-06-23 HISTORY — DX: Presence of spectacles and contact lenses: Z97.3

## 2021-06-23 HISTORY — DX: Other somatoform disorders: F45.8

## 2021-06-23 NOTE — Progress Notes (Signed)
YOU ARE SCHEDULED FOR A COVID TEST ON   06-25-2021  . THIS TEST MUST BE DONE BEFORE SURGERY. GO TO  [facility] @ [place] IN YOUR CAR, THIS IS A DRIVE UP TEST. AFTER YOUR COVID TEST , PLEASE WEAR A MASK OUT IN PUBLIC AND SOCIAL DISTANCE AND [name] YOUR HANDS FREQUENTLY. PLEASE ASK ALL YOUR CLOSE HOUSEHOLD CONTACT TO WEAR MASK OUT IN PUBLIC AND SOCIAL DISTANCE AND [name] HANDS FREQUENTLY ALSO.  YOU MAY BRING YOUR STUFFED ANIMAL AND YOUR CELL PHONE TO PRE OP DAY OF SURGERY.   Your procedure is scheduled on  06-29-2021  Report to [facility]   Call this number if you have problems the morning of surgery  :[phone].   OUR ADDRESS IS [place].  WE ARE LOCATED IN [facility].  PLEASE BRING YOUR INSURANCE CARD AND PHOTO ID DAY OF SURGERY.  ONLY ONE PERSON ALLOWED IN FACILITY WAITING AREA.                                     REMEMBER:  BRING YOUR MOUTH GUARD TO WEAR AFTER SURGERY.   DO NOT EAT FOOD, CANDY GUM OR MINTS  AFTER MIDNIGHT . YOU MAY HAVE CLEAR LIQUIDS FROM MIDNIGHT UNTIL 430 AM. NO CLEAR LIQUIDS AFTER  430 AM DAY OF SURGERY.   YOU MAY  BRUSH YOUR TEETH MORNING OF SURGERY AND RINSE YOUR MOUTH OUT, NO CHEWING GUM CANDY OR MINTS.    CLEAR LIQUID DIET   Foods Allowed                                                                     Foods Excluded  Coffee and tea, regular and decaf                             liquids that you cannot  Plain Jell-O any favor except red or purple                                           see through such as: Fruit ices (not with fruit pulp)                                     milk, soups, orange juice  Iced Popsicles                                    All solid food Carbonated beverages, regular and diet                                    Cranberry, grape and apple juices Sports drinks like Gatorade Lightly seasoned clear broth or  consume(fat free) Sugar  Sample Menu Breakfast                                Lunch                                     Supper Cranberry juice                    Beef broth                            Chicken broth Jell-O                                     Grape juice                           Apple juice Coffee or tea                        Jell-O                                      Popsicle                                                Coffee or tea                        Coffee or tea  _____________________________________________________________________     TAKE THESE MEDICATIONS MORNING OF SURGERY WITH A SIP OF WATER:  ABILIFY BUPROPION, BUSPAR, DULOXETINE, LORAZEPAM IF NEEDED.  ONE VISITOR IS ALLOWED IN WAITING ROOM ONLY DAY OF SURGERY.  NO VISITOR MAY SPEND THE NIGHT.  VISITOR ARE ALLOWED TO STAY UNTIL 800 PM.                                    DO NOT WEAR JEWERLY, MAKE UP. DO NOT WEAR LOTIONS, POWDERS, PERFUMES OR NAIL POLISH. DO NOT SHAVE FOR 48 HOURS PRIOR TO DAY OF SURGERY. MEN MAY SHAVE FACE AND NECK. CONTACTS, GLASSES, OR DENTURES MAY NOT BE WORN TO SURGERY.                                    [name] IS NOT RESPONSIBLE  FOR ANY BELONGINGS.                                                                    [name] Kitchen           [name] - Preparing for Surgery Before surgery, you can play an important role.  Because  skin is not sterile, your skin needs to be as free of germs as possible.  You can reduce the number of germs on your skin by washing with CHG (chlorahexidine gluconate) soap before surgery.  CHG is an antiseptic cleaner which kills germs and bonds with the skin to continue killing germs even after washing. Please DO NOT use if you have an allergy to CHG or antibacterial soaps.  If your skin becomes reddened/irritated stop using the CHG and inform your nurse when you arrive at Short Stay. Do not shave (including legs and underarms) for at least 48 hours prior to the  first CHG shower.  You may shave your face/neck. Please follow these instructions carefully:  1.  Shower with CHG Soap the night before surgery and the  morning of Surgery.  2.  If you choose to wash your hair, wash your hair first as usual with your  normal  shampoo.  3.  After you shampoo, rinse your hair and body thoroughly to remove the  shampoo.                            4.  Use CHG as you would any other liquid soap.  You can apply chg directly  to the skin and wash                      Gently with a scrungie or clean washcloth.  5.  Apply the CHG Soap to your body ONLY FROM THE NECK DOWN.   Do not use on face/ open                           Wound or open sores. Avoid contact with eyes, ears mouth and genitals (private parts).                       Wash face,  Genitals (private parts) with your normal soap.             6.  Wash thoroughly, paying special attention to the area where your surgery  will be performed.  7.  Thoroughly rinse your body with warm water from the neck down.  8.  DO NOT shower/wash with your normal soap after using and rinsing off  the CHG Soap.                9.  Pat yourself dry with a clean towel.            10.  Wear clean pajamas.            11.  Place clean sheets on your bed the night of your first shower and do not  sleep with pets. Day of Surgery : Do not apply any lotions/deodorants the morning of surgery.  Please wear clean clothes to the hospital/surgery center.  FAILURE TO FOLLOW THESE INSTRUCTIONS MAY RESULT IN THE CANCELLATION OF YOUR SURGERY PATIENT SIGNATURE_________________________________  NURSE SIGNATURE__________________________________  ________________________________________________________________________                                                        QUESTIONS Pamela Novak PRE OP NURSE PHONE [phone].

## 2021-06-23 NOTE — Patient Instructions (Signed)
We will refer you to [facility] and they will contact you with that appointment  Use Simethicone 1 capsule three times a day as needed  Trial of Lactose free diet  Lactose-Free Diet, Adult If you have lactose intolerance, you are not able to digest lactose. Lactose is a natural sugar found mainly in dairy milk and dairy products. A lactose-freediet can help you avoid foods and beverages that contain lactose. What are tips for following this plan? Reading food labels Do not consume foods, beverages, vitamins, minerals, or medicines containing lactose. Read ingredient lists carefully. Look for the words "lactose-free" on labels. Meal planning Use alternatives to dairy milk and foods made with milk products. These include the following: Lactose-free milk. Soy milk with added calcium and vitamin D. Almond milk, coconut milk, rice milk, or other nondairy milk alternatives with added calcium and vitamin D. Note that a lot of these are low in protein. Soy products, such as soy yogurt, soy cheese, soy ice cream, and soy-based sour cream. Other nut milk products, such as almond yogurt, almond cheese, cashew yogurt, cashew cheese, cashew ice cream, coconut yogurt, and coconut ice cream. Medicines, vitamins, and supplements Use lactase enzyme drops or tablets as directed by your health care provider. Make sure you get enough calcium and vitamin D in your diet. A lactose-free eating plan can be lacking in these important nutrients. Take calcium and vitamin D supplements as directed by your health care provider. Talk with your health care provider about supplements if you are not able to get enough calcium and vitamin D from food. What foods should I eat?  Fruits All fresh, canned, frozen, or dried fruits and fruit juices that are notprocessed with lactose. Vegetables All fresh, frozen, and canned vegetables without cheese, cream, or buttersauces. Grains Any that are not made with dairy milk or dairy  products. Meats and other proteins Any meat, fish, poultry, and other protein sources that are not made with dairymilk or dairy products. Fats and oils Any that are not made with dairy milk or dairy products. Sweets and desserts Any that are not made with dairy milk or dairy products. Seasonings and condiments Any that are not made with dairy milk or dairy products. Calcium Calcium is found in many foods that contain lactose and is important for bone health. The amount of calcium you need depends on your age: Adults younger than 50 years: 1,000 mg of calcium a day. Adults older than 50 years: 1,200 mg of calcium a day. If you are not getting enough calcium, you may get it from other sources, including: Orange juice that has been fortified with calcium. This means that calcium has been added to the product. There are 300-350 mg of calcium in 1 cup (237 mL) of calcium-fortified orange juice. Soy milk fortified with calcium. There are 300-400 mg of calcium in 1 cup (237 mL) of calcium-fortified soy milk. Rice or almond milk fortified with calcium. There are 300 mg of calcium in 1 cup (237 mL) of calcium-fortified rice or almond milk. Breakfast cereals fortified with calcium. There are 100-1,000 mg of calcium in calcium-fortified breakfast cereals. Spinach, cooked. There are 145 mg of calcium in  cup (90 g) of cooked spinach. Edamame, cooked. There are 130 mg of calcium in  cup (47 g) of cooked edamame. Collard greens, cooked. There are 125 mg of calcium in  cup (85 g) of cooked collard greens. Kale, frozen or cooked. There are 90 mg of calcium in  cup (59  g) of cooked or frozen kale. Almonds. There are 95 mg of calcium in  cup (35 g) of almonds. Broccoli, cooked. There are 60 mg of calcium in 1 cup (156 g) of cooked broccoli. The items listed above may not be a complete list of foods and beverages you can eat. Contact a dietitian for more options. What foods should I avoid? Lactose is  found in dairy milk and dairy products, such as: Yogurt. Cheese. Butter. Margarine. Sour cream. Cream. Whipped toppings and creamers. Ice cream and other dairy-based desserts. Lactose is also found in foods or products made with dairy milk or milk ingredients. To find out whether a food contains dairy milk or a milk ingredient, look at the ingredients list. Avoid foods with the statement "May contain milk" and foods that contain: Milk powder. Whey. Curd. Lactose. Lactoglobulin. The items listed above may not be a complete list of foods and beverages to avoid. Contact a dietitian for more information. Where to find more information [employer] of Diabetes and Digestive and Kidney Diseases: [email or web address] Summary If you are lactose intolerant, it means that you are not able to digest lactose, a natural sugar found in milk and milk products. Following a lactose-free diet can help you manage this condition. Calcium is important for bone health and is found in many foods that contain lactose. Talk with your health care provider about other sources of calcium. This information is not intended to replace advice given to you by your health care provider. Make sure you discuss any questions you have with your healthcare provider. Document Revised: 09/22/2020 Document Reviewed: 09/22/2020 Elsevier Patient Education  2022 [employer].  I appreciate the  opportunity to care for you  Thank You   Harl Bowie , MD

## 2021-06-23 NOTE — Progress Notes (Addendum)
Spoke w/ via phone for pre-op interview---PT Lab needs dos----    URINE PREG           Lab results------lab appt 06-25-2021 900 cbc cmp t & s COVID test -----06-25-2021 Arrive at -------530 am NPO after MN NO Solid Food.  Clear liquids from MN until---430 am Med rec completed Medications to take morning of surgery -----abilift, bupropion, buspar, duloxetine, lorazepam if needed Diabetic medication -----n/a Patient instructed no nail polish to be worn day of surgery Patient instructed to bring photo id and insurance card day of surgery Patient aware to have Driver (ride ) / caregiver   hal spouse  for 24 hours after surgery  Patient Special Instructions -----pt given overnight stay instructions Pre-Op special Istructions -----none Patient verbalized understanding of instructions that were given at this phone interview. Patient denies shortness of breath, chest pain, fever, cough at this phone interview.   Pt wears night guard and will bring for after surgery  No female caregivers if possible  Letter from dr Pearson Grippe dated 06-18-2021 on chart  Called patient and made aware she could bring cell phone and stuffed animal to pre op dos and spouse could visit until 800 pm dos per [employer].  Left message with donna @ dr Lynnette Caffey office to please have dr Lynnette Caffey call patient when possible
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
## 2021-06-24 ENCOUNTER — Encounter (HOSPITAL_BASED_OUTPATIENT_CLINIC_OR_DEPARTMENT_OTHER): Payer: Self-pay | Admitting: Obstetrics & Gynecology

## 2021-06-24 DIAGNOSIS — F4310 Post-traumatic stress disorder, unspecified: Secondary | ICD-10-CM | POA: Diagnosis not present

## 2021-06-24 DIAGNOSIS — F411 Generalized anxiety disorder: Secondary | ICD-10-CM | POA: Diagnosis not present

## 2021-06-24 DIAGNOSIS — F900 Attention-deficit hyperactivity disorder, predominantly inattentive type: Secondary | ICD-10-CM | POA: Diagnosis not present

## 2021-06-24 DIAGNOSIS — F332 Major depressive disorder, recurrent severe without psychotic features: Secondary | ICD-10-CM | POA: Diagnosis not present

## 2021-06-25 ENCOUNTER — Other Ambulatory Visit: Payer: Self-pay

## 2021-06-25 ENCOUNTER — Encounter (HOSPITAL_COMMUNITY)
Admission: RE | Admit: 2021-06-25 | Discharge: 2021-06-25 | Disposition: A | Discharge status: Home / Self Care | Payer: Federal, State, Local not specified - PPO | Source: Ambulatory Visit | Attending: Obstetrics & Gynecology | Admitting: Obstetrics & Gynecology

## 2021-06-25 ENCOUNTER — Other Ambulatory Visit: Payer: Self-pay | Admitting: Obstetrics & Gynecology

## 2021-06-25 DIAGNOSIS — Z01818 Encounter for other preprocedural examination: Secondary | ICD-10-CM | POA: Diagnosis not present

## 2021-06-25 LAB — COMPREHENSIVE METABOLIC PANEL
ALT: 26 U/L (ref 0–44)
AST: 21 U/L (ref 15–41)
Albumin: 4 g/dL (ref 3.5–5.0)
Alkaline Phosphatase: 56 U/L (ref 38–126)
Anion gap: 6 (ref 5–15)
BUN: 19 mg/dL (ref 6–20)
CO2: 29 mmol/L (ref 22–32)
Calcium: 9 mg/dL (ref 8.9–10.3)
Chloride: 103 mmol/L (ref 98–111)
Creatinine, Ser: 1.04 mg/dL — ABNORMAL HIGH (ref 0.44–1.00)
GFR, Estimated: >60 mL/min (ref >60)
Glucose, Bld: 112 mg/dL — ABNORMAL HIGH (ref 70–99)
Potassium: 3.8 mmol/L (ref 3.5–5.1)
Sodium: 138 mmol/L (ref 135–145)
Total Bilirubin: 0.6 mg/dL (ref 0.3–1.2)
Total Protein: 6.5 g/dL (ref 6.5–8.1)

## 2021-06-25 LAB — CBC
HCT: 39.9 % (ref 36.0–46.0)
Hemoglobin: 12.8 g/dL (ref 12.0–15.0)
MCH: 29.8 pg (ref 26.0–34.0)
MCHC: 32.1 g/dL (ref 30.0–36.0)
MCV: 92.8 fL (ref 80.0–100.0)
Platelets: 265 10*3/uL (ref 150–400)
RBC: 4.3 MIL/uL (ref 3.87–5.11)
RDW: 13.1 % (ref 11.5–15.5)
WBC: 5.9 10*3/uL (ref 4.0–10.5)
nRBC: 0 % (ref 0.0–0.2)

## 2021-06-26 LAB — SARS CORONAVIRUS 2 (TAT 6-24 HRS): SARS Coronavirus 2: NEGATIVE
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
## 2021-06-29 ENCOUNTER — Observation Stay (HOSPITAL_BASED_OUTPATIENT_CLINIC_OR_DEPARTMENT_OTHER)
Admission: RE | Admit: 2021-06-29 | Discharge: 2021-06-30 | Disposition: A | Discharge status: Home / Self Care | Payer: Federal, State, Local not specified - PPO | Source: Ambulatory Visit | Attending: Obstetrics & Gynecology | Admitting: Obstetrics & Gynecology

## 2021-06-29 ENCOUNTER — Encounter (HOSPITAL_BASED_OUTPATIENT_CLINIC_OR_DEPARTMENT_OTHER): Payer: Self-pay | Admitting: Obstetrics & Gynecology

## 2021-06-29 ENCOUNTER — Observation Stay (HOSPITAL_BASED_OUTPATIENT_CLINIC_OR_DEPARTMENT_OTHER): Payer: Federal, State, Local not specified - PPO | Admitting: Anesthesiology

## 2021-06-29 ENCOUNTER — Encounter: Payer: Self-pay | Admitting: Gastroenterology

## 2021-06-29 ENCOUNTER — Encounter (HOSPITAL_BASED_OUTPATIENT_CLINIC_OR_DEPARTMENT_OTHER)
Admission: RE | Disposition: A | Discharge status: Home / Self Care | Payer: Self-pay | Source: Ambulatory Visit | Attending: Obstetrics & Gynecology

## 2021-06-29 DIAGNOSIS — N816 Rectocele: Secondary | ICD-10-CM | POA: Insufficient documentation

## 2021-06-29 DIAGNOSIS — E785 Hyperlipidemia, unspecified: Secondary | ICD-10-CM | POA: Diagnosis not present

## 2021-06-29 DIAGNOSIS — K219 Gastro-esophageal reflux disease without esophagitis: Secondary | ICD-10-CM | POA: Diagnosis not present

## 2021-06-29 DIAGNOSIS — N819 Female genital prolapse, unspecified: Secondary | ICD-10-CM | POA: Diagnosis not present

## 2021-06-29 DIAGNOSIS — A630 Anogenital (venereal) warts: Secondary | ICD-10-CM | POA: Insufficient documentation

## 2021-06-29 DIAGNOSIS — R7303 Prediabetes: Secondary | ICD-10-CM | POA: Diagnosis not present

## 2021-06-29 DIAGNOSIS — N814 Uterovaginal prolapse, unspecified: Secondary | ICD-10-CM | POA: Diagnosis not present

## 2021-06-29 DIAGNOSIS — N8110 Cystocele, unspecified: Secondary | ICD-10-CM | POA: Diagnosis not present

## 2021-06-29 DIAGNOSIS — Z90710 Acquired absence of both cervix and uterus: Secondary | ICD-10-CM | POA: Diagnosis present

## 2021-06-29 DIAGNOSIS — N9089 Other specified noninflammatory disorders of vulva and perineum: Secondary | ICD-10-CM | POA: Diagnosis not present

## 2021-06-29 HISTORY — DX: Post-traumatic stress disorder, unspecified: F43.10

## 2021-06-29 HISTORY — PX: ANTERIOR AND POSTERIOR REPAIR WITH SACROSPINOUS FIXATION: SHX6536

## 2021-06-29 HISTORY — PX: CYSTOSCOPY: SHX5120

## 2021-06-29 HISTORY — PX: LAPAROSCOPIC VAGINAL HYSTERECTOMY WITH SALPINGECTOMY: SHX6680

## 2021-06-29 LAB — POCT PREGNANCY, URINE: Preg Test, Ur: NEGATIVE

## 2021-06-29 SURGERY — HYSTERECTOMY, VAGINAL, LAPAROSCOPY-ASSISTED, WITH SALPINGECTOMY
Anesthesia: General | Site: Vagina

## 2021-06-29 MED ORDER — OXYCODONE HCL 5 MG PO TABS
ORAL_TABLET | ORAL | Status: AC
  Filled 2021-06-29: qty 1

## 2021-06-29 MED ORDER — POVIDONE-IODINE 10 % EX SWAB: 2.0000 "application " | Freq: Once | CUTANEOUS | Status: DC

## 2021-06-29 MED ORDER — MENTHOL 3 MG MT LOZG: 1.0000 | LOZENGE | OROMUCOSAL | Status: DC | PRN

## 2021-06-29 MED ORDER — KETOROLAC TROMETHAMINE 30 MG/ML IJ SOLN
30.0000 mg | Freq: Four times a day (QID) | INTRAMUSCULAR | Status: DC
  Administered 2021-06-29 – 2021-06-30 (×3): 30 mg via INTRAVENOUS

## 2021-06-29 MED ORDER — BUPROPION HCL ER (XL) 300 MG PO TB24
300.0000 mg | ORAL_TABLET | Freq: Every day | ORAL | Status: DC
  Filled 2021-06-29: qty 1

## 2021-06-29 MED ORDER — BUPIVACAINE HCL (PF) 0.25 % IJ SOLN
INTRAMUSCULAR | Status: DC | PRN
  Administered 2021-06-29: 5 mL

## 2021-06-29 MED ORDER — KETOROLAC TROMETHAMINE 30 MG/ML IJ SOLN
INTRAMUSCULAR | Status: AC
  Filled 2021-06-29: qty 1

## 2021-06-29 MED ORDER — FENTANYL CITRATE (PF) 100 MCG/2ML IJ SOLN
INTRAMUSCULAR | Status: DC | PRN
  Administered 2021-06-29: 100 ug via INTRAVENOUS

## 2021-06-29 MED ORDER — DEXMEDETOMIDINE (PRECEDEX) IN NS 20 MCG/5ML (4 MCG/ML) IV SYRINGE
PREFILLED_SYRINGE | INTRAVENOUS | Status: AC
  Filled 2021-06-29: qty 5

## 2021-06-29 MED ORDER — OXYCODONE HCL 5 MG PO TABS: 5.0000 mg | ORAL_TABLET | Freq: Once | ORAL | Status: DC | PRN

## 2021-06-29 MED ORDER — ACETAMINOPHEN 500 MG PO TABS
ORAL_TABLET | ORAL | Status: AC
  Filled 2021-06-29: qty 2

## 2021-06-29 MED ORDER — DULOXETINE HCL 60 MG PO CPEP
120.0000 mg | ORAL_CAPSULE | Freq: Every day | ORAL | Status: DC
  Filled 2021-06-29: qty 2

## 2021-06-29 MED ORDER — DEXAMETHASONE SODIUM PHOSPHATE 10 MG/ML IJ SOLN
INTRAMUSCULAR | Status: AC
  Filled 2021-06-29: qty 1

## 2021-06-29 MED ORDER — 0.9 % SODIUM CHLORIDE (POUR BTL) OPTIME
TOPICAL | Status: DC | PRN
  Administered 2021-06-29: 500 mL

## 2021-06-29 MED ORDER — LACTATED RINGERS IV SOLN: INTRAVENOUS | Status: DC

## 2021-06-29 MED ORDER — TRAZODONE HCL 50 MG PO TABS
50.0000 mg | ORAL_TABLET | Freq: Every day | ORAL | Status: DC
  Administered 2021-06-29: 50 mg via ORAL
  Filled 2021-06-29: qty 2

## 2021-06-29 MED ORDER — FENTANYL CITRATE (PF) 100 MCG/2ML IJ SOLN
INTRAMUSCULAR | Status: AC
  Filled 2021-06-29: qty 2

## 2021-06-29 MED ORDER — HYDROMORPHONE HCL 1 MG/ML IJ SOLN: 0.2000 mg | INTRAMUSCULAR | Status: DC | PRN

## 2021-06-29 MED ORDER — ROCURONIUM BROMIDE 10 MG/ML (PF) SYRINGE
PREFILLED_SYRINGE | INTRAVENOUS | Status: DC | PRN
  Administered 2021-06-29: 10 mg via INTRAVENOUS
  Administered 2021-06-29: 60 mg via INTRAVENOUS
  Administered 2021-06-29: 10 mg via INTRAVENOUS

## 2021-06-29 MED ORDER — ONDANSETRON HCL 4 MG/2ML IJ SOLN: 4.0000 mg | Freq: Once | INTRAMUSCULAR | Status: DC | PRN

## 2021-06-29 MED ORDER — SODIUM CHLORIDE (PF) 0.9 % IJ SOLN
INTRAMUSCULAR | Status: AC
  Filled 2021-06-29: qty 10

## 2021-06-29 MED ORDER — KETOROLAC TROMETHAMINE 30 MG/ML IJ SOLN
INTRAMUSCULAR | Status: DC | PRN
  Administered 2021-06-29: 30 mg via INTRAVENOUS

## 2021-06-29 MED ORDER — ORAL CARE MOUTH RINSE: 15.0000 mL | Freq: Once | OROMUCOSAL | Status: DC

## 2021-06-29 MED ORDER — OXYCODONE HCL 5 MG PO TABS
ORAL_TABLET | ORAL | Status: AC
  Filled 2021-06-29: qty 2

## 2021-06-29 MED ORDER — HYDROMORPHONE HCL 1 MG/ML IJ SOLN
INTRAMUSCULAR | Status: AC
  Filled 2021-06-29: qty 1

## 2021-06-29 MED ORDER — WHITE PETROLATUM EX OINT
TOPICAL_OINTMENT | CUTANEOUS | Status: AC
  Filled 2021-06-29: qty 5

## 2021-06-29 MED ORDER — PROPOFOL 10 MG/ML IV BOLUS
INTRAVENOUS | Status: AC
  Filled 2021-06-29: qty 20

## 2021-06-29 MED ORDER — ESTRADIOL 0.1 MG/GM VA CREA
TOPICAL_CREAM | VAGINAL | Status: DC | PRN
  Administered 2021-06-29: 1 via VAGINAL

## 2021-06-29 MED ORDER — SODIUM CHLORIDE 0.9 % IR SOLN
Status: DC | PRN
Start: 2021-06-29 — End: 2021-06-29
  Administered 2021-06-29: 100 mL

## 2021-06-29 MED ORDER — OXYCODONE HCL 5 MG/5ML PO SOLN: 5.0000 mg | Freq: Once | ORAL | Status: DC | PRN

## 2021-06-29 MED ORDER — LIDOCAINE-EPINEPHRINE (PF) 1 %-1:200000 IJ SOLN
INTRAMUSCULAR | Status: DC | PRN
  Administered 2021-06-29: 30 mL

## 2021-06-29 MED ORDER — DOCUSATE SODIUM 100 MG PO CAPS
ORAL_CAPSULE | ORAL | Status: AC
  Filled 2021-06-29: qty 1

## 2021-06-29 MED ORDER — LORAZEPAM 1 MG PO TABS
1.0000 mg | ORAL_TABLET | Freq: Three times a day (TID) | ORAL | Status: DC | PRN
  Administered 2021-06-29: 1 mg via ORAL
  Filled 2021-06-29 (×2): qty 1

## 2021-06-29 MED ORDER — CHLORHEXIDINE GLUCONATE 0.12 % MT SOLN: 15.0000 mL | Freq: Once | OROMUCOSAL | Status: DC

## 2021-06-29 MED ORDER — SUGAMMADEX SODIUM 200 MG/2ML IV SOLN
INTRAVENOUS | Status: DC | PRN
  Administered 2021-06-29: 180 mg via INTRAVENOUS

## 2021-06-29 MED ORDER — CEFAZOLIN SODIUM-DEXTROSE 2-4 GM/100ML-% IV SOLN
2.0000 g | INTRAVENOUS | Status: AC
  Administered 2021-06-29: 2 g via INTRAVENOUS

## 2021-06-29 MED ORDER — ONDANSETRON HCL 4 MG/2ML IJ SOLN
INTRAMUSCULAR | Status: DC | PRN
  Administered 2021-06-29: 4 mg via INTRAVENOUS

## 2021-06-29 MED ORDER — MIDAZOLAM HCL 2 MG/2ML IJ SOLN
INTRAMUSCULAR | Status: AC
  Filled 2021-06-29: qty 2

## 2021-06-29 MED ORDER — PROPOFOL 10 MG/ML IV BOLUS
INTRAVENOUS | Status: DC | PRN
  Administered 2021-06-29: 200 mg via INTRAVENOUS

## 2021-06-29 MED ORDER — DOCUSATE SODIUM 100 MG PO CAPS
100.0000 mg | ORAL_CAPSULE | Freq: Two times a day (BID) | ORAL | Status: DC
  Administered 2021-06-29 (×2): 100 mg via ORAL

## 2021-06-29 MED ORDER — ONDANSETRON HCL 4 MG/2ML IJ SOLN: 4.0000 mg | Freq: Four times a day (QID) | INTRAMUSCULAR | Status: DC | PRN

## 2021-06-29 MED ORDER — HYDROMORPHONE HCL 1 MG/ML IJ SOLN
0.2500 mg | INTRAMUSCULAR | Status: DC | PRN
  Administered 2021-06-29 (×2): 0.5 mg via INTRAVENOUS

## 2021-06-29 MED ORDER — MIDAZOLAM HCL 2 MG/2ML IJ SOLN
INTRAMUSCULAR | Status: DC | PRN
  Administered 2021-06-29 (×2): 2 mg via INTRAVENOUS

## 2021-06-29 MED ORDER — LIDOCAINE 2% (20 MG/ML) 5 ML SYRINGE
INTRAMUSCULAR | Status: DC | PRN
  Administered 2021-06-29: 100 mg via INTRAVENOUS

## 2021-06-29 MED ORDER — SIMETHICONE 80 MG PO CHEW: 80.0000 mg | CHEWABLE_TABLET | Freq: Four times a day (QID) | ORAL | Status: DC | PRN

## 2021-06-29 MED ORDER — ONDANSETRON HCL 4 MG PO TABS: 4.0000 mg | ORAL_TABLET | Freq: Four times a day (QID) | ORAL | Status: DC | PRN

## 2021-06-29 MED ORDER — SCOPOLAMINE 1 MG/3DAYS TD PT72
MEDICATED_PATCH | TRANSDERMAL | Status: DC | PRN
  Administered 2021-06-29: 1 via TRANSDERMAL

## 2021-06-29 MED ORDER — LIDOCAINE HCL (PF) 2 % IJ SOLN
INTRAMUSCULAR | Status: AC
  Filled 2021-06-29: qty 5

## 2021-06-29 MED ORDER — CEFAZOLIN SODIUM-DEXTROSE 2-4 GM/100ML-% IV SOLN
INTRAVENOUS | Status: AC
  Filled 2021-06-29: qty 100

## 2021-06-29 MED ORDER — ROCURONIUM BROMIDE 10 MG/ML (PF) SYRINGE
PREFILLED_SYRINGE | INTRAVENOUS | Status: AC
  Filled 2021-06-29: qty 10

## 2021-06-29 MED ORDER — SCOPOLAMINE 1 MG/3DAYS TD PT72
MEDICATED_PATCH | TRANSDERMAL | Status: AC
  Filled 2021-06-29: qty 1

## 2021-06-29 MED ORDER — LACTATED RINGERS IV BOLUS
500.0000 mL | Freq: Once | INTRAVENOUS | Status: AC
  Administered 2021-06-29: 500 mL via INTRAVENOUS

## 2021-06-29 MED ORDER — DEXAMETHASONE SODIUM PHOSPHATE 10 MG/ML IJ SOLN
INTRAMUSCULAR | Status: DC | PRN
  Administered 2021-06-29 (×2): 5 mg via INTRAVENOUS

## 2021-06-29 MED ORDER — OXYCODONE HCL 5 MG PO TABS
5.0000 mg | ORAL_TABLET | ORAL | Status: DC | PRN
  Administered 2021-06-29: 5 mg via ORAL
  Administered 2021-06-29: 10 mg via ORAL
  Administered 2021-06-29: 5 mg via ORAL
  Administered 2021-06-30 (×2): 10 mg via ORAL

## 2021-06-29 MED ORDER — SODIUM CHLORIDE (PF) 0.9 % IJ SOLN
INTRAMUSCULAR | Status: DC | PRN
  Administered 2021-06-29: 30 mL

## 2021-06-29 MED ORDER — BUSPIRONE HCL 5 MG PO TABS
30.0000 mg | ORAL_TABLET | Freq: Two times a day (BID) | ORAL | Status: DC
  Administered 2021-06-29: 30 mg via ORAL
  Filled 2021-06-29 (×2): qty 6

## 2021-06-29 MED ORDER — ARIPIPRAZOLE 10 MG PO TABS
10.0000 mg | ORAL_TABLET | Freq: Every day | ORAL | Status: DC
  Filled 2021-06-29: qty 1

## 2021-06-29 MED ORDER — DEXMEDETOMIDINE (PRECEDEX) IN NS 20 MCG/5ML (4 MCG/ML) IV SYRINGE
PREFILLED_SYRINGE | INTRAVENOUS | Status: DC | PRN
  Administered 2021-06-29: 8 ug via INTRAVENOUS
  Administered 2021-06-29 (×2): 4 ug via INTRAVENOUS

## 2021-06-29 MED ORDER — AMISULPRIDE (ANTIEMETIC) 5 MG/2ML IV SOLN: 10.0000 mg | Freq: Once | INTRAVENOUS | Status: DC | PRN

## 2021-06-29 MED ORDER — ONDANSETRON HCL 4 MG/2ML IJ SOLN
INTRAMUSCULAR | Status: AC
  Filled 2021-06-29: qty 2

## 2021-06-29 MED ORDER — ACETAMINOPHEN 500 MG PO TABS
1000.0000 mg | ORAL_TABLET | Freq: Four times a day (QID) | ORAL | Status: DC
  Administered 2021-06-29 – 2021-06-30 (×4): 1000 mg via ORAL

## 2021-06-29 SURGICAL SUPPLY — 82 items
ADH SKN CLS APL DERMABOND .7 (GAUZE/BANDAGES/DRESSINGS) ×3
BAG DECANTER FOR FLEXI CONT (MISCELLANEOUS) ×1 IMPLANT
BLADE CLIPPER SENSICLIP SURGIC (BLADE) IMPLANT
BLADE SURG 11 STRL SS (BLADE) ×4 IMPLANT
BLADE SURG 15 STRL LF DISP TIS (BLADE) IMPLANT
BLADE SURG 15 STRL SS (BLADE) ×4
BNDG GAUZE ELAST 4 BULKY (GAUZE/BANDAGES/DRESSINGS) IMPLANT
CATH ROBINSON RED A/P 16FR (CATHETERS) ×4 IMPLANT
CLOTH BEACON ORANGE TIMEOUT ST (SAFETY) ×4 IMPLANT
CNTNR URN SCR LID CUP LEK RST (MISCELLANEOUS) IMPLANT
CONT SPEC 4OZ STRL OR WHT (MISCELLANEOUS) ×4
COVER BACK TABLE 60X90IN (DRAPES) ×4 IMPLANT
COVER MAYO STAND STRL (DRAPES) ×8 IMPLANT
DECANTER SPIKE VIAL GLASS SM (MISCELLANEOUS) IMPLANT
DERMABOND ADVANCED (GAUZE/BANDAGES/DRESSINGS) ×1
DERMABOND ADVANCED .7 DNX12 (GAUZE/BANDAGES/DRESSINGS) IMPLANT
DEVICE CAPIO SLIM SINGLE (INSTRUMENTS) IMPLANT
DRAPE SHEET LG 3/4 BI-LAMINATE (DRAPES) ×4 IMPLANT
DRSG COVADERM PLUS 2X2 (GAUZE/BANDAGES/DRESSINGS) ×3 IMPLANT
DRSG OPSITE POSTOP 3X4 (GAUZE/BANDAGES/DRESSINGS) ×3 IMPLANT
DURAPREP 26ML APPLICATOR (WOUND CARE) ×4 IMPLANT
ELECT REM PT RETURN 9FT ADLT (ELECTROSURGICAL) ×4
ELECTRODE REM PT RTRN 9FT ADLT (ELECTROSURGICAL) IMPLANT
FORCEPS CUTTING 45CM 5MM (CUTTING FORCEPS) ×1 IMPLANT
GAUZE 4X4 16PLY ~~LOC~~+RFID DBL (SPONGE) ×11 IMPLANT
GLOVE SURG POLYISO LF SZ5.5 (GLOVE) ×8 IMPLANT
GLOVE SURG UNDER POLY LF SZ6 (GLOVE) ×8 IMPLANT
GOWN STRL REUS W/TWL LRG LVL3 (GOWN DISPOSABLE) ×16 IMPLANT
GOWN STRL REUS W/TWL XL LVL3 (GOWN DISPOSABLE) ×3 IMPLANT
HIBICLENS CHG 4% 4OZ BTL (MISCELLANEOUS) ×1 IMPLANT
HOLDER FOLEY CATH W/STRAP (MISCELLANEOUS) ×4 IMPLANT
IV NS 1000ML (IV SOLUTION) ×8
IV NS 1000ML BAXH (IV SOLUTION) IMPLANT
IV NS IRRIG 3000ML ARTHROMATIC (IV SOLUTION) IMPLANT
KIT TURNOVER CYSTO (KITS) ×4 IMPLANT
LIGASURE IMPACT 36 18CM CVD LR (INSTRUMENTS) ×1 IMPLANT
LIGASURE LAP L-HOOKWIRE 5 44CM (INSTRUMENTS) IMPLANT
MANIPULATOR UTERINE 4.5 ZUMI (MISCELLANEOUS) IMPLANT
NDL MAYO 6 CRC TAPER PT (NEEDLE) IMPLANT
NDL MAYO CATGUT SZ4 TPR NDL (NEEDLE) IMPLANT
NEEDLE HYPO 22GX1.5 SAFETY (NEEDLE) IMPLANT
NEEDLE INSUFFLATION 120MM (ENDOMECHANICALS) ×4 IMPLANT
NEEDLE MAYO 6 CRC TAPER PT (NEEDLE) IMPLANT
NEEDLE MAYO CATGUT SZ4 (NEEDLE) IMPLANT
NS IRRIG 500ML POUR BTL (IV SOLUTION) ×4 IMPLANT
PACK LAVH (CUSTOM PROCEDURE TRAY) ×4 IMPLANT
PACK ROBOTIC GOWN (GOWN DISPOSABLE) ×3 IMPLANT
PACK TRENDGUARD 450 HYBRID PRO (MISCELLANEOUS) IMPLANT
PACK VAGINAL WOMENS (CUSTOM PROCEDURE TRAY) ×8 IMPLANT
PACKING VAGINAL (PACKING) ×1 IMPLANT
PAD OB MATERNITY 4.3X12.25 (PERSONAL CARE ITEMS) ×4 IMPLANT
PAD PREP 24X48 CUFFED NSTRL (MISCELLANEOUS) ×4 IMPLANT
PANTS MESH DISP LRG (UNDERPADS AND DIAPERS) ×3 IMPLANT
PANTS MESH DISPOSABLE L (UNDERPADS AND DIAPERS) ×1
SCISSORS LAP 5X35 DISP (ENDOMECHANICALS) IMPLANT
SET IRRIG Y TYPE TUR BLADDER L (SET/KITS/TRAYS/PACK) IMPLANT
SET SUCTION IRRIG HYDROSURG (IRRIGATION / IRRIGATOR) IMPLANT
SET TUBE SMOKE EVAC HIGH FLOW (TUBING) ×4 IMPLANT
SPONGE T-LAP 4X18 ~~LOC~~+RFID (SPONGE) ×4 IMPLANT
SUT CAPIO POLYGLYCOLIC (SUTURE) IMPLANT
SUT MNCRL 0 MO-4 VIOLET 18 CR (SUTURE) ×6 IMPLANT
SUT MNCRL AB 3-0 PS2 18 (SUTURE) ×4 IMPLANT
SUT MON AB 2-0 CT1 36 (SUTURE) IMPLANT
SUT MON AB 3-0 SH 27 (SUTURE)
SUT MON AB 3-0 SH27 (SUTURE) IMPLANT
SUT MONOCRYL 0 MO 4 18  CR/8 (SUTURE) ×12
SUT VIC AB 0 CT1 27 (SUTURE)
SUT VIC AB 0 CT1 27XCR 8 STRN (SUTURE) IMPLANT
SUT VIC AB 2-0 CT1 27 (SUTURE)
SUT VIC AB 2-0 CT1 TAPERPNT 27 (SUTURE) IMPLANT
SUT VIC AB 2-0 CT2 27 (SUTURE) ×12 IMPLANT
SUT VIC AB 3-0 SH 8-18 (SUTURE) IMPLANT
SUT VICRYL 0 TIES 12 18 (SUTURE) ×4 IMPLANT
SUT VICRYL 0 UR6 27IN ABS (SUTURE) ×4 IMPLANT
SYR BULB IRRIG 60ML STRL (SYRINGE) IMPLANT
TOWEL OR 17X26 10 PK STRL BLUE (TOWEL DISPOSABLE) ×8 IMPLANT
TRAY FOLEY W/BAG SLVR 14FR LF (SET/KITS/TRAYS/PACK) ×4 IMPLANT
TRENDGUARD 450 HYBRID PRO PACK (MISCELLANEOUS) ×4
TROCAR BLADELESS OPT 5 100 (ENDOMECHANICALS) ×4 IMPLANT
TROCAR XCEL NON-BLD 11X100MML (ENDOMECHANICALS) ×4 IMPLANT
WARMER LAPAROSCOPE (MISCELLANEOUS) ×4 IMPLANT
WATER STERILE IRR 500ML POUR (IV SOLUTION) ×3 IMPLANT

## 2021-06-29 NOTE — Anesthesia Procedure Notes (Signed)
Procedure Name: Intubation Date/Time: 06/29/2021 7:36 AM Performed by: Suan Halter, CRNA Pre-anesthesia Checklist: Patient identified, Emergency Drugs available, Suction available and Patient being monitored Patient Re-evaluated:Patient Re-evaluated prior to induction Oxygen Delivery Method: Circle system utilized Preoxygenation: Pre-oxygenation with 100% oxygen Induction Type: IV induction Ventilation: Mask ventilation without difficulty Laryngoscope Size: Mac and 3 Grade View: Grade I Tube type: Oral Tube size: 7.0 mm Number of attempts: 1 Airway Equipment and Method: Stylet and Oral airway Placement Confirmation: ETT inserted through vocal cords under direct vision, positive ETCO2 and breath sounds checked- equal and bilateral Secured at: 22 cm Tube secured with: Tape Dental Injury: Teeth and Oropharynx as per pre-operative assessment

## 2021-06-29 NOTE — Progress Notes (Signed)
Added to consent this AM per previous discussions with patient in the office: cautery of vulvar hemangiomas and excision of lesion at introitus.  This was initialed by patient and myself.  No changes to H&P.  Patient is feeling well in terms of anxiety s/p Ativan taken this AM.  Linda Hedges, DO

## 2021-06-29 NOTE — Op Note (Signed)
PROCEDURE DATE: 06/29/2021  PREOPERATIVE DIAGNOSIS: Pelvic organ prolapse (Stage I cystocele, Stage II rectocele), lesion at introitus, vulvar hemagiomas  POSTOPERATIVE DIAGNOSIS: The same  PROCEDURE: Laparoscopic Assisted Vaginal Hysterectomy, Bilateral Salpingectomy, Cautery of vulvar hemangiomas.  See Operative Note by Dr. Joycelyn Schmid for Anterior/Posterior colporrhaphy, removal of lesion at introitus and cystoscopy  SURGEON: Dr. Linda Hedges  ASSISTANT: Dr. Linda Hedges INDICATIONS: 50 y.o. G2P2 with symptomatic pelvic organ prolapse desiring definitive surgical management. Risks of surgery were discussed with the patient including but not limited to: bleeding which may require transfusion or reoperation; infection which may require antibiotics; injury to bowel, bladder, ureters or other surrounding organs; need for additional procedures including laparotomy; thromboembolic phenomenon, incisional problems and other postoperative/anesthesia complications. Written informed consent was obtained.  FINDINGS: Small uterus, normal adnexa bilaterally. Stage I cystocele, Stage II rectocele  ANESTHESIA: General  ESTIMATED BLOOD LOSS: 100 ml  SPECIMENS: Uterus, cervix and bilateral fallopian tubes  COMPLICATIONS: None immediate  PROCEDURE IN DETAIL: The patient received intravenous antibiotics and had sequential compression devices applied to her lower extremities while in the preoperative area. She was then taken to the operating room where general anesthesia was administered and was found to be adequate. She was placed in the dorsal lithotomy position, and was prepped and draped in a sterile manner. An in and out catheterization was performed. A uterine manipulator was then advanced into the uterus . After an adequate timeout was performed, attention was then turned to the patient's abdomen where a 10-mm skin incision was made in the umbilical fold. The Veress needle was carefully introduced into the  peritoneal cavity through the abdominal wall. Intraperitoneal placement was confirmed by drop in intraabdominal pressure with insufflation of carbon dioxide gas. Adequate pneumoperitoneum was obtained, and the 10/11 XL trocar and sleeve were then advanced without difficulty into the abdomen where intraabdominal placement was confirmed by the laparoscope. A survey of the patient's pelvis and abdomen revealed entirely normal anatomy. Suprapubic 5 XL port was then placed under direct visualization. The pelvis was then carefully examined. On the right side, the fallopian tube was elevated and freed from the mesosalpinx using the Gyrus.  The right round ligament was then clamped and transected with the Gyrus. The uteroovarian ligament was also clamped and transected. The leaves of the broad ligament were separated and serially transected. These procedures were then repeated on the left side. The ureters were noted to be safely away from the area of dissection.  At this point, attention was turned to the vaginal portion of the case. A weighted speculum was placed posteriorly, a Deaver anteriorly, and the cervix grasped with a thyroid tenaculum. Once the anterior and posterior reflections were identified, the cervix was circumscribed using the Bovie knife. Next, using Mayos, the posterior cul-de-sac was entered. The uterosacral ligaments were clamped, cut, and suture ligated and left tagged.  Next, the bladder reflection was identified. Using Metzenbaums, it was entered and palpation and direct visualization confirmed proper location. Next, using the LigaSure, the uterine arteries were coapted and cut bilaterally. The pedicles were visualized after coaptation and were hemostatic. The same was performed sequentially cephalad until the uterus, cervix and bilateral fallopian tubes were removed. The pedicles were inspected and found to be hemostatic.  Next, the tagged uterosacrals were tied together in midline.  The  posterior cuff was closed using monocryl figure of 8 stitches. The anterior cuff was left open for Dr. Sherlynn Stalls portion of the case.  Please see his operative note for this  portion. Following completion of A/P repair and cysto, the cuff was noted to be hemostatic.  Foley catheter was placed.  2 2 mm sized hemangiomas (one on the right mid-labia and one on the left mid-labia) was cauterized and curretage removed slough.  Attention was returned to the abdomen were a second laparoscopic look was taken. All pedicles were hemostatic. Insufflation was removed after all instruments were removed.  The infraumbilical fascial incision was closed with 0 vicryl in a figure of eight stitch.  Both skin incisions were closed with 4-0 Vicryl subcuticular stitches and Dermabond. The patient tolerated the procedures well. All instruments, needles, and sponge counts were correct x 2. The patient was taken to the recovery room awake, extubated and in stable condition.

## 2021-06-29 NOTE — Anesthesia Preprocedure Evaluation (Signed)
Anesthesia Evaluation  Patient identified by MRN, date of birth, ID band Patient awake    Reviewed: Allergy & Precautions, NPO status , Patient's Chart, lab work & pertinent test results  History of Anesthesia Complications Negative for: history of anesthetic complications  Airway Mallampati: II  TM Distance: >3 FB Neck ROM: Full    Dental  (+) Dental Advisory Given, Teeth Intact   Pulmonary neg pulmonary ROS,    Pulmonary exam normal        Cardiovascular Normal cardiovascular exam  HLD   Neuro/Psych  Headaches, Anxiety Depression    GI/Hepatic Neg liver ROS, GERD (s/p Nissen)  ,  Endo/Other  negative endocrine ROSPre-diabetes  Renal/GU negative Renal ROS  negative genitourinary   Musculoskeletal  (+) Arthritis ,   Abdominal   Peds  Hematology negative hematology ROS (+)   Anesthesia Other Findings   Reproductive/Obstetrics CYSTOCELE, RECTOCELE                            Anesthesia Physical Anesthesia Plan  ASA: 2  Anesthesia Plan: General   Post-op Pain Management:    Induction: Intravenous  PONV Risk Score and Plan: 4 or greater and Ondansetron, Dexamethasone, Treatment may vary due to age or medical condition and Midazolam  Airway Management Planned: Oral ETT  Additional Equipment: None  Intra-op Plan:   Post-operative Plan: Extubation in OR  Informed Consent: I have reviewed the patients History and Physical, chart, labs and discussed the procedure including the risks, benefits and alternatives for the proposed anesthesia with the patient or authorized representative who has indicated his/her understanding and acceptance.     Dental advisory given  Plan Discussed with:   Anesthesia Plan Comments:         Anesthesia Quick Evaluation

## 2021-06-29 NOTE — Progress Notes (Signed)
Spoke with Dr Lynnette Caffey regarding urine out put orders given for 500 ml bolus.

## 2021-06-29 NOTE — Anesthesia Postprocedure Evaluation (Signed)
Anesthesia Post Note  Patient: Pamela Novak  Procedure(s) Performed: LAPAROSCOPIC ASSISTED VAGINAL HYSTERECTOMY WITH BIILATERAL SALPINGECTOMY; CAUTERY OF VULVAR HEMANGIOMAS AND EXCISION OF LESION AT INTROITUS (Bilateral: Vagina ) ANTERIOR AND POSTERIOR REPAIR (Vagina ) CYSTOSCOPY (Bladder)     Patient location during evaluation: PACU Anesthesia Type: General Level of consciousness: awake and alert Pain management: pain level controlled Vital Signs Assessment: post-procedure vital signs reviewed and stable Respiratory status: spontaneous breathing, nonlabored ventilation and respiratory function stable Cardiovascular status: blood pressure returned to baseline and stable Postop Assessment: no apparent nausea or vomiting Anesthetic complications: no   No notable events documented.  Last Vitals:  Vitals:   06/29/21 1115 06/29/21 1130  BP: 111/68 123/82  Pulse: (!) 104 (!) 113  Resp: (!) 22 20  Temp:  36.8 C  SpO2: 94% 98%    Last Pain:  Vitals:   06/29/21 1130  TempSrc:   PainSc: 4                  Lidia Collum

## 2021-06-29 NOTE — Op Note (Signed)
NAME: Pamela Novak, Pamela Novak MEDICAL RECORD NO: [id number] ACCOUNT NO: [id number] DATE OF BIRTH: 1971-09-18 FACILITY: [facility] LOCATION: [facility] PHYSICIAN: Daleen Bo. Lyn Hollingshead, MD  Operative Report   DATE OF PROCEDURE: 06/29/2021  PREOPERATIVE DIAGNOSES:  Cystocele and rectocele.  POSTOPERATIVE DIAGNOSES:  Cystocele and rectocele.  PROCEDURE:  Anterior and posterior vaginal repair and cystoscopy.  SURGEON:  Everlene Farrier II, MD  ASSISTANT:  Linda Hedges, DO  ANESTHESIA:  General with endotracheal intubation.  ESTIMATED BLOOD LOSS:  For this procedure, 50 mL.  SPECIMENS:  None.  INDICATIONS AND CONSENT:  This patient is a 50 year old patient with symptomatic pelvic prolapse.  She is admitted to the hospital for laparoscopically-assisted vaginal hysterectomy with bilateral salpingectomy and anterior and posterior repair, possible  sacrospinous ligament suspension and cystoscopy.  Prior to the procedure, the procedure was again reviewed with the patient, and potential complications were discussed including but not limited to infection, organ damage, bleeding requiring transfusion  of blood products with HIV and hepatitis acquisition, DVT, PE, pneumonia, pelvic pain, painful intercourse, vulvar pain, fistula formation.  She states she understands and agrees and consent is signed and on the chart.  DESCRIPTION OF PROCEDURE:  The patient is taken to the operating room where she is identified and undergoes laparoscopically-assisted vaginal hysterectomy with bilateral salpingectomy per Dr. Lynnette Caffey.  This is dictated by Dr. Lynnette Caffey under a separate note.   The posterior half of the vaginal cuff is closed and at that point, I began.  The inspection reveals first-degree cystocele.  The anterior vaginal mucosa is injected with the 50:50 mixture of 1% lidocaine with 1:200,000 epinephrine mixed with normal  saline.  The incision started at the vaginal cuff, taken down the midline approximately two-thirds of  the way of the anterior vaginal mucosa.  The mucosa is dissected bilaterally sharply and bluntly.  The cystocele is then reduced first with a  pursestring suture of 0 Monocryl.  All sutures will be 0 Monocryl unless otherwise designated.  Then, interrupted 0 Monocryl sutures are used to reapproximate the vesicovaginal fascia in the midline.  Excess vaginal mucosa is trimmed from the margins of  the incision and then the anterior mucosa is closed in a running locking fashion with 2-0 Vicryl suture down to the level of the vaginal cuff.  The anterior vaginal cuff is then closed with interrupted 0 Monocryl sutures.  Inspection then reveals a  second-degree rectocele with good support of the vaginal cuff.  The posterior vaginal mucosa is injected with the same dilute 1% lidocaine with 1:200,000 epinephrine solution.  A small wedge of vaginal mucosa is trimmed from the introitus, which is  careful to incorporate the area in question apparently by the patient that she had discussed with Dr. Lynnette Caffey.  This is sent to pathology separately.  The posterior vaginal mucosa is then taken down the midline, approximately two-thirds of the length of  the vagina.  This was then dissected bilaterally sharply and bluntly.  The rectovaginal fascia is then reapproximated with interrupted 0 Monocryl suture.  This offers good support and offers adequate caliber to the vagina on careful inspection.  Excess  vaginal mucosa is then trimmed and the posterior mucosa is closed in a running locking fashion with a 2-0 Vicryl suture.  At the introitus, a single figure-of-eight 0 Monocryl suture is used to reapproximate the introitus at the 6 o'clock position and  then the 2-0 Vicryl was taken down in a standard episiotomy type fashion to close the mucosa completely. Rectal exam confirms  it is intact. Cystoscopy is then carried out with a 30-degree cystoscope.  A 360-degree inspection reveals a good bubble. No evidence of foreign bodies and a  good  puff of urine from the ureters bilaterally.  Cystoscope is removed and a Foley catheter is placed.  A vaginal pack with Estrace cream is then placed and Dr. Lynnette Caffey completes her case.  At this point, the counts are correct.  The patient is in stable  condition.   SHW D: 06/29/2021 10:00:00 am T: 06/29/2021 10:31:00 am  JOB: [id number]

## 2021-06-29 NOTE — Progress Notes (Signed)
Reviewed with patient anterior/posterior vaginal repair, possible sacrospinous ligament suspension, and cystoscopy. D/W risks including infection, organ damage, bleeding/transfusion-Hiv/Hep, DVT/PE, pneumonia, pelvic pain, low back pain, vulvar pain, fistula, painful intercourse. She states she understands and agrees.

## 2021-06-29 NOTE — Progress Notes (Signed)
NOS  Patient has no current c/o.  She reports adequate pain control.  No N/V.  No CP/SOB.  Tolerating full diet.  Vitals:   06/29/21 1200 06/29/21 1300  BP: 104/63 113/66  Pulse: (!) 107 (!) 101  Resp: 18 18  Temp: (!) 97.1 F (36.2 C) (!) 97.4 F (36.3 C)  SpO2: 94% 96%   UOP clear and adequate (~75 cc/hour)  Gen: A&O x 3 Abd: soft, Non-distended.  Inc c/d/I x 2 Ext: no c/c/e; SCDs on  POD0 s/p LAVH, BS, A/P colporrhaphy, cysto -Adequate pain control; continue to monitor for pain management -Foley and vag pack to be d/c'd in AM -AM labs pending -Plan to ambulate  Linda Hedges, DO

## 2021-06-29 NOTE — Progress Notes (Signed)
06/29/2021  9:49 AM  PATIENT:  Pamela Novak  50 y.o. female  PRE-OPERATIVE DIAGNOSIS:  CYSTOCELE, RECTOCELE  POST-OPERATIVE DIAGNOSIS:  CYSTOCELE, RECTOCELE  PROCEDURE:  anterior/posterior vaginal repair, cystoscopy  SURGEON:  Surgeon(s) and Role:    Everlene Farrier MD-primary    Linda Hedges DO-assist  PHYSICIAN ASSISTANT:   ASSISTANTS: none   ANESTHESIA:   general  EBL:  50 mL   BLOOD ADMINISTERED:none  DRAINS: Urinary Catheter (Foley)   LOCAL MEDICATIONS USED:  LIDOCAINE  and Amount: approximately 10 ml of 1% lidocaine with 1/200 epinephrine mixed 50/50 with saline  SPECIMEN:  No Specimen  DISPOSITION OF SPECIMEN:  N/A  COUNTS:  YES  TOURNIQUET:  * No tourniquets in log *  DICTATION: .Other Dictation: Dictation Number [id number]  PLAN OF CARE: Admit for overnight observation  PATIENT DISPOSITION:  PACU - hemodynamically stable.   Delay start of Pharmacological VTE agent (>24hrs) due to surgical blood loss or risk of bleeding: not applicable

## 2021-06-29 NOTE — Transfer of Care (Signed)
Immediate Anesthesia Transfer of Care Note  Patient: Pamela Novak  Procedure(s) Performed: Procedure(s) (LRB): LAPAROSCOPIC ASSISTED VAGINAL HYSTERECTOMY WITH BIILATERAL SALPINGECTOMY; CAUTERY OF VULVAR HEMANGIOMAS AND EXCISION OF LESION AT INTROITUS (Bilateral) ANTERIOR AND POSTERIOR REPAIR (N/A) CYSTOSCOPY (N/A)  Patient Location: PACU  Anesthesia Type: General  Level of Consciousness: awake, oriented, sedated and patient cooperative  Airway & Oxygen Therapy: Patient Spontanous Breathing and Patient connected to face mask oxygen  Post-op Assessment: Report given to PACU RN and Post -op Vital signs reviewed and stable  Post vital signs: Reviewed and stable  Complications: No apparent anesthesia complications Last Vitals:  Vitals Value Taken Time  BP 118/75 06/29/21 1030  Temp 36.4 C 06/29/21 1025  Pulse 101 06/29/21 1035  Resp 16 06/29/21 1035  SpO2 95 % 06/29/21 1035  Vitals shown include unvalidated device data.  Last Pain:  Vitals:   06/29/21 1025  TempSrc:   PainSc: Asleep      Patients Stated Pain Goal: 2 ([id number] [phone])  Complications: No notable events documented.

## 2021-06-30 DIAGNOSIS — N819 Female genital prolapse, unspecified: Secondary | ICD-10-CM | POA: Diagnosis not present

## 2021-06-30 DIAGNOSIS — N816 Rectocele: Secondary | ICD-10-CM | POA: Diagnosis not present

## 2021-06-30 DIAGNOSIS — A630 Anogenital (venereal) warts: Secondary | ICD-10-CM | POA: Diagnosis not present

## 2021-06-30 DIAGNOSIS — N8110 Cystocele, unspecified: Secondary | ICD-10-CM | POA: Diagnosis not present

## 2021-06-30 DIAGNOSIS — R7303 Prediabetes: Secondary | ICD-10-CM | POA: Diagnosis not present

## 2021-06-30 LAB — COMPREHENSIVE METABOLIC PANEL
ALT: 17 U/L (ref 0–44)
ALT: 17 U/L (ref 0–44)
AST: 15 U/L (ref 15–41)
AST: 17 U/L (ref 15–41)
Albumin: 3.3 g/dL — ABNORMAL LOW (ref 3.5–5.0)
Albumin: 3.3 g/dL — ABNORMAL LOW (ref 3.5–5.0)
Alkaline Phosphatase: 42 U/L (ref 38–126)
Alkaline Phosphatase: 41 U/L (ref 38–126)
Anion gap: 6 (ref 5–15)
Anion gap: 5 (ref 5–15)
BUN: 20 mg/dL (ref 6–20)
BUN: 18 mg/dL (ref 6–20)
CO2: 28 mmol/L (ref 22–32)
CO2: 28 mmol/L (ref 22–32)
Calcium: 9.1 mg/dL (ref 8.9–10.3)
Calcium: 8.8 mg/dL — ABNORMAL LOW (ref 8.9–10.3)
Chloride: 108 mmol/L (ref 98–111)
Chloride: 107 mmol/L (ref 98–111)
Creatinine, Ser: 1.12 mg/dL — ABNORMAL HIGH (ref 0.44–1.00)
Creatinine, Ser: 1.13 mg/dL — ABNORMAL HIGH (ref 0.44–1.00)
GFR, Estimated: 60 mL/min — ABNORMAL LOW (ref >60)
GFR, Estimated: 59 mL/min — ABNORMAL LOW (ref >60)
Glucose, Bld: 131 mg/dL — ABNORMAL HIGH (ref 70–99)
Glucose, Bld: 141 mg/dL — ABNORMAL HIGH (ref 70–99)
Potassium: 4.5 mmol/L (ref 3.5–5.1)
Potassium: 4.3 mmol/L (ref 3.5–5.1)
Sodium: 142 mmol/L (ref 135–145)
Sodium: 140 mmol/L (ref 135–145)
Total Bilirubin: 0.3 mg/dL (ref 0.3–1.2)
Total Bilirubin: 0.4 mg/dL (ref 0.3–1.2)
Total Protein: 5.4 g/dL — ABNORMAL LOW (ref 6.5–8.1)
Total Protein: 5.3 g/dL — ABNORMAL LOW (ref 6.5–8.1)

## 2021-06-30 LAB — CBC
HCT: 31.1 % — ABNORMAL LOW (ref 36.0–46.0)
Hemoglobin: 9.7 g/dL — ABNORMAL LOW (ref 12.0–15.0)
MCH: 29.8 pg (ref 26.0–34.0)
MCHC: 31.2 g/dL (ref 30.0–36.0)
MCV: 95.7 fL (ref 80.0–100.0)
Platelets: 187 10*3/uL (ref 150–400)
RBC: 3.25 MIL/uL — ABNORMAL LOW (ref 3.87–5.11)
RDW: 12.9 % (ref 11.5–15.5)
WBC: 12.9 10*3/uL — ABNORMAL HIGH (ref 4.0–10.5)
nRBC: 0 % (ref 0.0–0.2)

## 2021-06-30 LAB — SURGICAL PATHOLOGY

## 2021-06-30 MED ORDER — OXYCODONE HCL 5 MG PO TABS
ORAL_TABLET | ORAL | Status: AC
  Filled 2021-06-30: qty 2

## 2021-06-30 MED ORDER — KETOROLAC TROMETHAMINE 30 MG/ML IJ SOLN
INTRAMUSCULAR | Status: AC
  Filled 2021-06-30: qty 1

## 2021-06-30 MED ORDER — ACETAMINOPHEN 500 MG PO TABS
ORAL_TABLET | ORAL | Status: AC
  Filled 2021-06-30: qty 2

## 2021-06-30 MED ORDER — LACTATED RINGERS IV BOLUS
500.0000 mL | Freq: Once | INTRAVENOUS | Status: AC
  Administered 2021-06-30: 500 mL via INTRAVENOUS

## 2021-06-30 NOTE — Progress Notes (Signed)
Patient states she has decided that it is best to go home rather than to be transferred to hospital. Called placed to Dr. Lynnette Caffey who is speaking with her via phone now regarding discharge instructions

## 2021-06-30 NOTE — Progress Notes (Signed)
Received call from RN that patient has decided to go home.  I asked to be transferred for phone conversation directly with patient to confirm this desire.  She states she would prefer discharge as she is feeling well and has pain medication which she has already filled to have on D/C.  She is given discharge instructions and will RTO 2 weeks for postop visit.  Will enter d/c orders.

## 2021-06-30 NOTE — Progress Notes (Signed)
1 Day Post-Op Procedure(s) (LRB): LAPAROSCOPIC ASSISTED VAGINAL HYSTERECTOMY WITH BIILATERAL SALPINGECTOMY; CAUTERY OF VULVAR HEMANGIOMAS AND EXCISION OF LESION AT INTROITUS (Bilateral) ANTERIOR AND POSTERIOR REPAIR (N/A) CYSTOSCOPY (N/A)  Subjective: Patient reports tolerating PO, + flatus, and no problems voiding.  No N/V.  Ambulating without difficulty.  Patient has extreme anxiety which she reports is well controlled on medications recommended by psychiatrist, Dr. Albertine Patricia.  Patient had a poor postop experience previously and felt her pain was inadequately treated on discharge.  She reports her pain control is still a source of anxiety for her at this time and does not feel comfortable with discharge right now secondary to this.  Objective: I have reviewed patient's vital signs, intake and output, medications, and labs.  CBC Latest Ref Rng & Units 06/30/2021 06/25/2021 05/28/2021  WBC 4.0 - 10.5 K/uL 12.9(H) 5.9 6.5  Hemoglobin 12.0 - 15.0 g/dL 9.7(L) 12.8 13.8  Hematocrit 36.0 - 46.0 % 31.1(L) 39.9 41.6  Platelets 150 - 400 K/uL 187 265 235.0    CMP Latest Ref Rng & Units 06/30/2021 06/30/2021 06/25/2021  Glucose 70 - 99 mg/dL 141(H) 131(H) 112(H)  BUN 6 - 20 mg/dL '18 20 19  '$ Creatinine 0.44 - 1.00 mg/dL 1.13(H) 1.12(H) 1.04(H)  Sodium 135 - 145 mmol/L 140 142 138  Potassium 3.5 - 5.1 mmol/L 4.3 4.5 3.8  Chloride 98 - 111 mmol/L 107 108 103  CO2 22 - 32 mmol/L '28 28 29  '$ Calcium 8.9 - 10.3 mg/dL 8.8(L) 9.1 9.0  Total Protein 6.5 - 8.1 g/dL 5.3(L) 5.4(L) 6.5  Total Bilirubin 0.3 - 1.2 mg/dL 0.4 0.3 0.6  Alkaline Phos 38 - 126 U/L 41 42 56  AST 15 - 41 U/L '17 15 21  '$ ALT 0 - 44 U/L '17 17 26     '$ General: alert, cooperative, and appears stated age GI: incision: clean, dry, and intact and non-distended abdomen Extremities: extremities normal, atraumatic, no cyanosis or edema Pelvic: small amount of blood on pad s/p packing removal by RN  Assessment: s/p Procedure(s): LAPAROSCOPIC  ASSISTED VAGINAL HYSTERECTOMY WITH BIILATERAL SALPINGECTOMY; CAUTERY OF VULVAR HEMANGIOMAS AND EXCISION OF LESION AT INTROITUS (Bilateral) ANTERIOR AND POSTERIOR REPAIR (N/A) CYSTOSCOPY (N/A): stable, progressing well, and tolerating diet  Plan: -Patient is meeting routine postop goals but has extreme anxiety about d/c home now given concerns about pain control.  Will transfer [facility] at [facility] for continued inpatient pain control. -Elevated creatinine-preop creatinine was 1.04.  Postop increased to 1.12 and 1.13 on repeat labs.  Suspect dehydration.  RN reports patient does not drink much.  Encouraged to increase water consumption and will recheck labs tomorrow am.   -Continue to ambulate   LOS: 0 days    Linda Hedges 06/30/2021, 8:24 AM

## 2021-06-30 NOTE — Discharge Instructions (Signed)
Call MD for T>100.4, heavy vaginal bleeding, severe abdominal pain, intractable nausea and vomiting, or respiratory distress.  Call office to schedule postop appt in 2 weeks.  No driving while taking narcotics.  Pelvic rest x 6 weeks.

## 2021-06-30 NOTE — Progress Notes (Signed)
Vaginal packing removed with moderate amt of sanguinous drg noted.   Pt tolerated procedure well.

## 2021-07-01 ENCOUNTER — Encounter (HOSPITAL_BASED_OUTPATIENT_CLINIC_OR_DEPARTMENT_OTHER): Payer: Self-pay | Admitting: Obstetrics & Gynecology

## 2021-07-01 NOTE — Discharge Summary (Signed)
Physician Discharge Summary  Patient ID: Pamela Novak MRN: [id number] DOB/AGE: 05-Nov-1970 50 y.o.  Admit date: 06/29/2021 Discharge date: 07/01/2021  Admission Diagnoses: Symptomatic pelvic organ prolapse  Discharge Diagnoses:  Active Problems:   Prolapse of female pelvic organs   S/P laparoscopic assisted vaginal hysterectomy (LAVH) Bilateral salpingectomy, A/P Anterior/Posterior Colporrhaphy, Cystoscopy, lesion removal at introitus and vulvar hemangioma cautery  Discharged Condition: good  Hospital Course: Patient was admitted for anticipated LAVH, bilateral salpingectomy, A/P repair, cysto, excision of lesion at introitus and vulvar hemangioma cautery.  She underwent the procedure without complication.  Patient did well postoperatively.  An aggressive, proactive plan was made given her severe anxiety and PTSD to manage her anxiety and postoperative pain.  She was satisfied with her experience.  She was discharged home on POD#1 after meeting goals: voiding without difficulty, tolerating po, ambulating well, adequate pain control. She will f/u in office in 2 weeks.  Consults: None  Significant Diagnostic Studies: none  Treatments: surgery: LAVH, BS, A/P colporrhaphy, cysto, removal of lesion at introitus and cautery of vulvar hemangiomas  Discharge Exam: Blood pressure 111/68, pulse 88, temperature 97.8 F (36.6 C), resp. rate 16, height '5\' 7"'$  (1.702 m), weight 76.2 kg, last menstrual period 01/19/2020, SpO2 96 %.  Gen: A& O x 3 Abd: soft, ND.  Incisions c/d/I x 2 Ext: no c/c/e    Disposition: Discharge disposition: 01-Home or Self Care       Discharge Instructions     Discharge patient   Complete by: As directed    Discharge disposition: 01-Home or Self Care   Discharge patient date: 06/30/2021      Allergies as of 06/30/2021       Reactions   Capsaicin Cough, Other (See Comments), Diarrhea, Nausea Only      Nsaids    Stomach pain   Other Other (See Comments)    ANY PEPPER - flu like symptoms    Flagyl [metronidazole] Rash        Medication List     STOP taking these medications    loperamide 2 MG tablet Commonly known as: IMODIUM A-D       TAKE these medications    ALPRAZolam 0.5 MG tablet Commonly known as: XANAX Take 0.5 mg by mouth 2 (two) times daily as needed for anxiety or sleep.   amphetamine-dextroamphetamine 30 MG tablet Commonly known as: ADDERALL Take 1 tablet by mouth 2 (two) times daily.   ARIPiprazole 10 MG tablet Commonly known as: ABILIFY Take 10 mg by mouth daily.   atorvastatin 20 MG tablet Commonly known as: LIPITOR TAKE 1 TABLET BY MOUTH EVERY DAY What changed:  how much to take how to take this   buPROPion 300 MG 24 hr tablet Commonly known as: WELLBUTRIN XL Take 300 mg by mouth daily.   busPIRone 30 MG tablet Commonly known as: BUSPAR Take 30 mg by mouth 2 (two) times daily.   clotrimazole 10 MG troche Commonly known as: MYCELEX TAKE 1 TABLET (10 MG TOTAL) BY MOUTH 4 TIMES DAILY AS NEEDED. CLOTRIMAZOLE LOZENGES 10 MG ONE EVERY 4-6 HOURS AS NEEDED   DULoxetine 60 MG capsule Commonly known as: CYMBALTA Take 120 mg by mouth daily.   EPINEPHrine 0.3 mg/0.3 mL Soaj injection Commonly known as: EPI-PEN Inject 0.3 mg into the muscle as needed for anaphylaxis.   gabapentin 100 MG capsule Commonly known as: NEURONTIN TAKE 1 CAPSULE BY MOUTH AT BEDTIME. INCREASE UP TO 300 MG AT NIGHT IF NEEDED   hydrocortisone  2.5 % rectal cream Commonly known as: ANUSOL-HC PLACE 1 APPLICATION RECTALLY 2 (TWO) TIMES DAILY.   hydrocortisone 25 MG suppository Commonly known as: ANUSOL-HC PLACE 1 SUPPOSITORY (25 MG TOTAL) RECTALLY DAILY AS NEEDED FOR HEMORRHOIDS.   ketoconazole 2 % shampoo Commonly known as: NIZORAL APPLY 1 APPLICATION TOPICALLY 2 (TWO) TIMES A WEEK.   LORazepam 1 MG tablet Commonly known as: ATIVAN Take 1 mg by mouth 3 (three) times daily as needed for anxiety.   lubiprostone 8 MCG  capsule Commonly known as: AMITIZA TAKE 1 CAPSULE BY MOUTH 2 (TWO) TIMES DAILY WITH A MEAL. What changed: See the new instructions.   nitroGLYCERIN 0.4 MG SL tablet Commonly known as: NITROSTAT 0.4 mg 3 (three) times daily as needed.   ondansetron 4 MG tablet Commonly known as: Zofran Take 1 tablet (4 mg total) by mouth every 8 (eight) hours as needed for nausea or vomiting.   polyethylene glycol powder 17 GM/SCOOP powder Commonly known as: GLYCOLAX/MIRALAX Take half capful daily and titrate based on response to have 1-2 soft bowel movements daily.   traZODone 50 MG tablet Commonly known as: DESYREL Take 50-100 mg by mouth at bedtime.   triamcinolone 0.1 % paste Commonly known as: KENALOG Use as directed 1 application in the mouth or throat 2 (two) times daily.   triamcinolone cream 0.1 % Commonly known as: KENALOG Apply 1 application topically 2 (two) times daily as needed.   Vyvanse 70 MG capsule Generic drug: lisdexamfetamine Take 70 mg by mouth daily.   zolmitriptan 5 MG nasal solution Commonly known as: ZOMIG Place 1 spray into the nose as needed for migraine. What changed: Another medication with the same name was removed. Continue taking this medication, and follow the directions you see here.         Signed: Linda Hedges 07/01/2021, 12:57 PM

## 2021-07-03 LAB — BPAM RBC
Blood Product Expiration Date: 202209232359
Unit Type and Rh: 5100

## 2021-07-03 LAB — TYPE AND SCREEN
ABO/RH(D): O POS
Antibody Screen: NEGATIVE
Unit division: 0

## 2021-07-12 ENCOUNTER — Telehealth: Payer: Self-pay | Admitting: Gastroenterology

## 2021-07-12 NOTE — Telephone Encounter (Signed)
Lattie Haw with [facility] called requesting more documentation and more information on referral sent. She stated that referral is for "pain at surgical site". Pls call her at [phone]

## 2021-07-12 NOTE — Telephone Encounter (Signed)
Incomplete information sent to [facility]. Discussed the reason for the referral which was not clear in the original referral.

## 2021-08-02 DIAGNOSIS — G8918 Other acute postprocedural pain: Secondary | ICD-10-CM | POA: Diagnosis not present

## 2021-08-05 DIAGNOSIS — F332 Major depressive disorder, recurrent severe without psychotic features: Secondary | ICD-10-CM | POA: Diagnosis not present

## 2021-08-05 DIAGNOSIS — F4310 Post-traumatic stress disorder, unspecified: Secondary | ICD-10-CM | POA: Diagnosis not present

## 2021-08-05 DIAGNOSIS — F411 Generalized anxiety disorder: Secondary | ICD-10-CM | POA: Diagnosis not present

## 2021-08-11 DIAGNOSIS — Z09 Encounter for follow-up examination after completed treatment for conditions other than malignant neoplasm: Secondary | ICD-10-CM | POA: Diagnosis not present

## 2021-08-12 DIAGNOSIS — R140 Abdominal distension (gaseous): Secondary | ICD-10-CM | POA: Diagnosis not present

## 2021-08-12 DIAGNOSIS — Z09 Encounter for follow-up examination after completed treatment for conditions other than malignant neoplasm: Secondary | ICD-10-CM | POA: Diagnosis not present

## 2021-08-12 DIAGNOSIS — R1012 Left upper quadrant pain: Secondary | ICD-10-CM | POA: Diagnosis not present

## 2021-08-17 ENCOUNTER — Other Ambulatory Visit: Payer: Self-pay | Admitting: General Surgery

## 2021-08-17 DIAGNOSIS — R1012 Left upper quadrant pain: Secondary | ICD-10-CM

## 2021-08-17 DIAGNOSIS — Z9889 Other specified postprocedural states: Secondary | ICD-10-CM

## 2021-08-23 ENCOUNTER — Other Ambulatory Visit: Payer: Self-pay | Admitting: Internal Medicine

## 2021-08-24 DIAGNOSIS — F4310 Post-traumatic stress disorder, unspecified: Secondary | ICD-10-CM | POA: Diagnosis not present

## 2021-08-24 DIAGNOSIS — F900 Attention-deficit hyperactivity disorder, predominantly inattentive type: Secondary | ICD-10-CM | POA: Diagnosis not present

## 2021-08-31 DIAGNOSIS — N941 Unspecified dyspareunia: Secondary | ICD-10-CM | POA: Diagnosis not present

## 2021-09-02 ENCOUNTER — Encounter: Payer: Federal, State, Local not specified - PPO | Admitting: Physician Assistant

## 2021-09-02 ENCOUNTER — Other Ambulatory Visit: Payer: Self-pay

## 2021-09-02 ENCOUNTER — Ambulatory Visit
Admission: RE | Admit: 2021-09-02 | Discharge: 2021-09-02 | Disposition: A | Discharge status: Home / Self Care | Payer: Federal, State, Local not specified - PPO | Source: Ambulatory Visit | Attending: General Surgery | Admitting: General Surgery

## 2021-09-02 DIAGNOSIS — F900 Attention-deficit hyperactivity disorder, predominantly inattentive type: Secondary | ICD-10-CM | POA: Diagnosis not present

## 2021-09-02 DIAGNOSIS — Z90710 Acquired absence of both cervix and uterus: Secondary | ICD-10-CM | POA: Diagnosis not present

## 2021-09-02 DIAGNOSIS — R1012 Left upper quadrant pain: Secondary | ICD-10-CM | POA: Diagnosis not present

## 2021-09-02 DIAGNOSIS — Z9889 Other specified postprocedural states: Secondary | ICD-10-CM

## 2021-09-02 DIAGNOSIS — F411 Generalized anxiety disorder: Secondary | ICD-10-CM | POA: Diagnosis not present

## 2021-09-02 DIAGNOSIS — F332 Major depressive disorder, recurrent severe without psychotic features: Secondary | ICD-10-CM | POA: Diagnosis not present

## 2021-09-02 DIAGNOSIS — F4310 Post-traumatic stress disorder, unspecified: Secondary | ICD-10-CM | POA: Diagnosis not present

## 2021-09-02 MED ORDER — IOPAMIDOL (ISOVUE-300) INJECTION 61%
100.0000 mL | Freq: Once | INTRAVENOUS | Status: AC | PRN
  Administered 2021-09-02: 100 mL via INTRAVENOUS

## 2021-09-06 ENCOUNTER — Encounter: Payer: Federal, State, Local not specified - PPO | Admitting: Physician Assistant

## 2021-09-09 ENCOUNTER — Telehealth: Payer: Federal, State, Local not specified - PPO | Admitting: Family Medicine

## 2021-09-09 ENCOUNTER — Encounter: Payer: Self-pay | Admitting: Family Medicine

## 2021-09-09 VITALS — Wt 173.0 lb

## 2021-09-09 DIAGNOSIS — R519 Headache, unspecified: Secondary | ICD-10-CM | POA: Diagnosis not present

## 2021-09-09 DIAGNOSIS — Z20822 Contact with and (suspected) exposure to covid-19: Secondary | ICD-10-CM | POA: Diagnosis not present

## 2021-09-09 DIAGNOSIS — R0981 Nasal congestion: Secondary | ICD-10-CM

## 2021-09-09 DIAGNOSIS — N760 Acute vaginitis: Secondary | ICD-10-CM

## 2021-09-09 DIAGNOSIS — Z20828 Contact with and (suspected) exposure to other viral communicable diseases: Secondary | ICD-10-CM | POA: Diagnosis not present

## 2021-09-09 MED ORDER — AMOXICILLIN-POT CLAVULANATE 875-125 MG PO TABS: 1.0000 | ORAL_TABLET | Freq: Two times a day (BID) | ORAL | 0 refills | Status: DC

## 2021-09-09 MED ORDER — BENZONATATE 100 MG PO CAPS: ORAL_CAPSULE | ORAL | 0 refills | Status: DC

## 2021-09-09 MED ORDER — FLUCONAZOLE 150 MG PO TABS: 150.0000 mg | ORAL_TABLET | Freq: Once | ORAL | 0 refills | Status: AC

## 2021-09-09 NOTE — Patient Instructions (Addendum)
   ---------------------------------------------------------------------------------------------------------------------------      WORK SLIP:  Patient Pamela Novak,  12-20-1970, was seen for a medical visit today, 09/09/21 . Please excuse from work for a COVID/flu like illness. If Covid19 testing is positive advise 10 days minimum from the onset of symptoms (09/04/21) PLUS 1 day of no fever and improved symptoms. Will defer to employer for a sooner return to work if patient has 2 negative covid tests 48 hours apart and is feeling better, or if symptoms have resolved, it is greater than 5 days since the positive test and the patient can wear a high-quality, tight fitting mask such as N95 or KN95 at all times for an additional 5 days. Would also suggest COVID19 antigen testing is negative prior to early return from a Covid illness.  Sincerely: E-signature: Dr. Colin Benton, DO [facility] [place] Ph: [phone]   ------------------------------------------------------------------------------------------------------------------------------   HOME CARE TIPS:  -I sent the medication(s) we discussed to your pharmacy: Meds ordered this encounter  Medications   benzonatate (TESSALON PERLES) 100 MG capsule    Sig: 1-2 capsules up to twice daily    Dispense:  20 capsule    Refill:  0   amoxicillin-clavulanate (AUGMENTIN) 875-125 MG tablet    Sig: Take 1 tablet by mouth 2 (two) times daily.    Dispense:  20 tablet    Refill:  0   fluconazole (DIFLUCAN) 150 MG tablet    Sig: Take 1 tablet (150 mg total) by mouth once for 1 dose.    Dispense:  1 tablet    Refill:  0    -can use tylenol if needed for fevers, aches and pains per instructions  -can use nasal saline a few times per day if you have nasal congestion  -sometimes  a short course of Afrin nasal spray for 3 days can help with symptoms as well  -stay hydrated, drink plenty of fluids and eat small healthy meals -  avoid dairy  -can take 1000 IU (49mcg) Vit D3 and 100-500 mg of Vit C daily per instructions  -follow up with your doctor in 2-3 days unless improving and feeling better  -stay home while sick, except to seek medical care. If you have COVID19, ideally it would be best to stay home for a full 10 days since the onset of symptoms PLUS one day of no fever and feeling better. Wear a good mask that fits snugly (such as N95 or KN95) if around others to reduce the risk of transmission.  It was nice to meet you today, and I really hope you are feeling better soon. I help [facility] out with telemedicine visits on Tuesdays and Thursdays and am available for visits on those days. If you have any concerns or questions following this visit please schedule a follow up visit with your Primary Care doctor or seek care at a local urgent care clinic to avoid delays in care.    Seek in person care or schedule a follow up video visit promptly if your symptoms worsen, new concerns arise or you are not improving with treatment. Call 911 and/or seek emergency care if your symptoms are severe or life threatening.

## 2021-09-09 NOTE — Progress Notes (Signed)
Virtual Visit via Video Note  I connected with Pamela Novak  on 09/09/21 at  3:20 PM EST by a video enabled telemedicine application and verified that I am speaking with the correct person using two identifiers.  Location patient: home, [place] Location provider:work or home office Persons participating in the virtual visit: patient, provider  I discussed the limitations of evaluation and management by telemedicine and the availability of in person appointments. The patient expressed understanding and agreed to proceed.   HPI:  Acute telemedicine visit for cough and congestion: -Onset:5-6 days ago, but does have chronic nasal congestion with allergies -did a home covid test a few days ago and did an inperson test today and this was negative -boss at work was sick last week, then she got sick -green nasal congestion and sinus discomfort - she thinks she has sinusitis and wants an abx -Symptoms include: sore throat, headache, nasal congestion, fatigue, cough -Denies:fevers, CP, SOB, NVD, inability to eat/drink/get out of bed -Has tried: hot tea, hot baths, tylenol -Pertinent past medical history:see below -Pertinent medication allergies:  Allergies  Allergen Reactions   Capsaicin Cough, Other (See Comments), Diarrhea and Nausea Only        Nsaids     Stomach pain   Other Other (See Comments)    ANY PEPPER - flu like symptoms     Flagyl [Metronidazole] Rash  -COVID-19 vaccine status: Immunization History  Administered Date(s) Administered   Influenza,inj,Quad PF,6+ Mos 08/17/2017, 08/27/2018, 06/05/2019, 11/26/2020   PFIZER(Purple Top)SARS-COV-2 Vaccination 01/16/2020, 02/12/2020, 02/28/2020   Tdap 01/18/2016   Zoster Recombinat (Shingrix) 05/26/2020     ROS: See pertinent positives and negatives per HPI.  Past Medical History:  Diagnosis Date   ADHD    Allergy    Anxiety    severe   Arthritis    Bruxism (teeth grinding) 06/23/2021   WEARS  NIGHT GUARD WILL BRING DOS    Complication of anesthesia    "woke up during colonoscopy" age 50 years old   Constipation    Depression    Diverticulosis    Folliculitis 52/96/2229   ACNE PER PT   GERD (gastroesophageal reflux disease)    occ hiccup   Hemorrhoids    History of colon polyps    Hyperlipidemia    IBS (irritable bowel syndrome)    Migraines    Pre-diabetes    PTSD (post-traumatic stress disorder)    severe per psychiatry note dr Pearson Grippe 06-18-2021   Restless legs syndrome (RLS)    Sciatica    Right side   Varicose veins of lower extremity    in vagina also and both legs   Wears glasses 06/23/2021    Past Surgical History:  Procedure Laterality Date   24 HOUR [place] STUDY N/A 04/01/2019   Procedure: 24 HOUR PH STUDY;  Surgeon: Mauri Pole, MD;  Location: [facility];  Service: Endoscopy;  Laterality: N/A;   ABLATION ON ENDOMETRIOSIS  10/04/2012   Procedure: ABLATION ON ENDOMETRIOSIS;  Surgeon: Linda Hedges, DO;  Location: [facility];  Service: Gynecology;  Laterality: N/A;   ANTERIOR AND POSTERIOR REPAIR WITH SACROSPINOUS FIXATION N/A 06/29/2021   Procedure: ANTERIOR AND POSTERIOR REPAIR;  Surgeon: Linda Hedges, DO;  Location: [facility];  Service: Gynecology;  Laterality: N/A;   bravo test  03/11/2021   COLONOSCOPY  2019   48yr f/u   CONDYLOMA EXCISION/FULGURATION  10/04/2012   Procedure: CONDYLOMA REMOVAL;  Surgeon: Linda Hedges, DO;  Location: [facility];  Service: Gynecology;  Laterality: N/A;   CYSTOSCOPY N/A 06/29/2021   Procedure: CYSTOSCOPY;  Surgeon: Linda Hedges, DO;  Location: [facility];  Service: Gynecology;  Laterality: N/A;   DILITATION & CURRETTAGE/HYSTROSCOPY WITH NOVASURE ABLATION  10/04/2012   Procedure: DILATATION & CURETTAGE/HYSTEROSCOPY WITH NOVASURE ABLATION;  Surgeon: Linda Hedges, DO;  Location: [facility];  Service: Gynecology;  Laterality: N/A;   ESOPHAGEAL MANOMETRY N/A 04/01/2019   Procedure: ESOPHAGEAL MANOMETRY (EM);  Surgeon:  Mauri Pole, MD;  Location: [facility];  Service: Endoscopy;  Laterality: N/A;   HEMORRHOID BANDING     multiple times yrs ago   LAPAROSCOPIC NISSEN FUNDOPLICATION N/A 40/98/1191   Procedure: LAPAROSCOPIC HIATAL HERNIA REPAIR AND NISSEN FUNDOPLICATION;  Surgeon: [name], MD;  Location: [facility];  Service: General;  Laterality: N/A;   LAPAROSCOPIC VAGINAL HYSTERECTOMY WITH SALPINGECTOMY Bilateral 06/29/2021   Procedure: LAPAROSCOPIC ASSISTED VAGINAL HYSTERECTOMY WITH BIILATERAL SALPINGECTOMY; CAUTERY OF VULVAR HEMANGIOMAS AND EXCISION OF LESION AT INTROITUS;  Surgeon: Linda Hedges, DO;  Location: [facility];  Service: Gynecology;  Laterality: Bilateral;   LAPAROSCOPY  10/04/2012   Procedure: LAPAROSCOPY OPERATIVE;  Surgeon: Linda Hedges, DO;  Location: [facility];  Service: Gynecology;  Laterality: N/A;   MASS EXCISION Bilateral 09/05/2019   Procedure: EXCISION BILATERAL SOFT TISSUE MASSES, THIGH;  Surgeon: [name], MD;  Location: [facility];  Service: General;  Laterality: Bilateral;   RADIOFREQUENCY ABLATION NERVES     sciatic nerve last 3 to 4 vertebrate lower and spine and sciatic nerve both legs   UPPER GI ENDOSCOPY  04/01/2019     Current Outpatient Medications:    amoxicillin-clavulanate (AUGMENTIN) 875-125 MG tablet, Take 1 tablet by mouth 2 (two) times daily., Disp: 20 tablet, Rfl: 0   amphetamine-dextroamphetamine (ADDERALL) 30 MG tablet, Take 1 tablet by mouth 2 (two) times daily., Disp: , Rfl:    atorvastatin (LIPITOR) 20 MG tablet, TAKE 1 TABLET BY MOUTH EVERY DAY, Disp: 90 tablet, Rfl: 1   benzonatate (TESSALON PERLES) 100 MG capsule, 1-2 capsules up to twice daily, Disp: 20 capsule, Rfl: 0   EPINEPHrine 0.3 mg/0.3 mL IJ SOAJ injection, Inject 0.3 mg into the muscle as needed for anaphylaxis., Disp: , Rfl:    fluconazole (DIFLUCAN) 150 MG tablet, Take 1 tablet (150 mg total) by mouth once for 1 dose., Disp: 1 tablet, Rfl: 0   hydrocortisone (ANUSOL-HC)  2.5 % rectal cream, PLACE 1 APPLICATION RECTALLY 2 (TWO) TIMES DAILY., Disp: 30 g, Rfl: 1   hydrocortisone (ANUSOL-HC) 25 MG suppository, PLACE 1 SUPPOSITORY (25 MG TOTAL) RECTALLY DAILY AS NEEDED FOR HEMORRHOIDS., Disp: 12 suppository, Rfl: 5   ondansetron (ZOFRAN) 4 MG tablet, Take 1 tablet (4 mg total) by mouth every 8 (eight) hours as needed for nausea or vomiting., Disp: 30 tablet, Rfl: 0   polyethylene glycol powder (GLYCOLAX/MIRALAX) 17 GM/SCOOP powder, Take half capful daily and titrate based on response to have 1-2 soft bowel movements daily., Disp: 255 g, Rfl: 1   VYVANSE 70 MG capsule, Take 70 mg by mouth daily., Disp: , Rfl:    zolmitriptan (ZOMIG) 5 MG nasal solution, Place 1 spray into the nose as needed for migraine., Disp: 6 each, Rfl: 0  EXAM:  VITALS per patient if applicable:  GENERAL: alert, oriented, appears well and in no acute distress  HEENT: atraumatic, conjunttiva clear, no obvious abnormalities on inspection of external nose and ears  NECK: normal movements of the head and neck  LUNGS: on inspection no signs of respiratory distress, breathing rate  appears normal, no obvious gross SOB, gasping or wheezing  CV: no obvious cyanosis  MS: moves all visible extremities without noticeable abnormality  PSYCH/NEURO: pleasant and cooperative, no obvious depression or anxiety, speech and thought processing grossly intact  ASSESSMENT AND PLAN:  Discussed the following assessment and plan:  Nasal congestion  Facial discomfort  -we discussed possible serious and likely etiologies, options for evaluation and workup, limitations of telemedicine visit vs in person visit, treatment, treatment risks and precautions. Pt is agreeable to treatment via telemedicine at this moment. Query VURI, covid, influenza sinusitis vs other. Discussed viral vs bacterial sinusitis, risks with abx, etc. She opted for trial nasal saline, nasal decongestant, tessalon for cough, delayed abx if  needed. She requested diflucan as well if uses abx as reports requires this for vaginitis if uses abx.  Work/School slipped offered: provided in patient instructions   Advised to seek prompt in person care if worsening, new symptoms arise, or if is not improving with treatment. Discussed options for inperson care if PCP office not available. Did let this patient know that I only do telemedicine on Tuesdays and Thursdays for [facility]. Advised to schedule follow up visit with PCP or [facility] if any further questions or concerns to avoid delays in care.   I discussed the assessment and treatment plan with the patient. The patient was provided an opportunity to ask questions and all were answered. The patient agreed with the plan and demonstrated an understanding of the instructions.     Lucretia Kern, DO

## 2021-09-28 DIAGNOSIS — N9089 Other specified noninflammatory disorders of vulva and perineum: Secondary | ICD-10-CM | POA: Diagnosis not present

## 2021-09-28 DIAGNOSIS — N900 Mild vulvar dysplasia: Secondary | ICD-10-CM | POA: Diagnosis not present

## 2021-10-07 DIAGNOSIS — F332 Major depressive disorder, recurrent severe without psychotic features: Secondary | ICD-10-CM | POA: Diagnosis not present

## 2021-10-07 DIAGNOSIS — F900 Attention-deficit hyperactivity disorder, predominantly inattentive type: Secondary | ICD-10-CM | POA: Diagnosis not present

## 2021-10-07 DIAGNOSIS — F4310 Post-traumatic stress disorder, unspecified: Secondary | ICD-10-CM | POA: Diagnosis not present

## 2021-10-07 DIAGNOSIS — F411 Generalized anxiety disorder: Secondary | ICD-10-CM | POA: Diagnosis not present

## 2021-10-19 DIAGNOSIS — F900 Attention-deficit hyperactivity disorder, predominantly inattentive type: Secondary | ICD-10-CM | POA: Diagnosis not present

## 2021-10-19 DIAGNOSIS — F4310 Post-traumatic stress disorder, unspecified: Secondary | ICD-10-CM | POA: Diagnosis not present

## 2021-10-19 DIAGNOSIS — F411 Generalized anxiety disorder: Secondary | ICD-10-CM | POA: Diagnosis not present

## 2021-10-27 DIAGNOSIS — A630 Anogenital (venereal) warts: Secondary | ICD-10-CM | POA: Diagnosis not present

## 2021-11-08 DIAGNOSIS — J101 Influenza due to other identified influenza virus with other respiratory manifestations: Secondary | ICD-10-CM | POA: Diagnosis not present

## 2021-11-08 DIAGNOSIS — Z03818 Encounter for observation for suspected exposure to other biological agents ruled out: Secondary | ICD-10-CM | POA: Diagnosis not present

## 2021-11-08 DIAGNOSIS — U071 COVID-19: Secondary | ICD-10-CM | POA: Diagnosis not present

## 2021-11-09 ENCOUNTER — Encounter: Payer: Federal, State, Local not specified - PPO | Admitting: Physician Assistant

## 2021-11-16 DIAGNOSIS — N898 Other specified noninflammatory disorders of vagina: Secondary | ICD-10-CM | POA: Diagnosis not present

## 2021-11-17 ENCOUNTER — Telehealth: Payer: Self-pay | Admitting: Gastroenterology

## 2021-11-17 NOTE — Telephone Encounter (Signed)
Patient called regarding the procedure that was done back in May where she had an EGD and a Bravo Monitor.  The insurance rejected it and said it would need to be resubmitted.  She spoke with [facility] and they told her to contact our office.  Can you please call and let her know how she should proceed?  Thank you.

## 2021-11-18 NOTE — Telephone Encounter (Signed)
Inquired with pre-certification coordinator.  Looks like was $1500 not covered by insurance.  ?? Deductible her responsibility?

## 2021-11-22 NOTE — Telephone Encounter (Signed)
I left a detailed message for the patient with the response below.  She is asked to call back with any additional questions or concerns.     Good Morning Here is what our claim specialist report back to me with:   The denial reason was missing or invalid modifier. After reviewing the charge further, the claim went out as [id number] however 91035 hit a charge scrubber that said that if it was performed in POS 24 then modifier 26 was needed. At some point modifier 26 was added but the corrected claim was never sent to [facility]. I sent the corrected claim to [facility] with both modifiers today.   Let me know if you have any more questions.  Have a great weekend!  [place]    SW-Professional Billing SW Professional Billing Manager Office: [phone] Website: [email or web address]

## 2021-11-23 DIAGNOSIS — M791 Myalgia, unspecified site: Secondary | ICD-10-CM | POA: Diagnosis not present

## 2021-11-23 DIAGNOSIS — M79671 Pain in right foot: Secondary | ICD-10-CM | POA: Diagnosis not present

## 2021-11-23 DIAGNOSIS — M79642 Pain in left hand: Secondary | ICD-10-CM | POA: Diagnosis not present

## 2021-11-23 DIAGNOSIS — M79672 Pain in left foot: Secondary | ICD-10-CM | POA: Diagnosis not present

## 2021-11-23 DIAGNOSIS — M1990 Unspecified osteoarthritis, unspecified site: Secondary | ICD-10-CM | POA: Diagnosis not present

## 2021-11-23 DIAGNOSIS — M2550 Pain in unspecified joint: Secondary | ICD-10-CM | POA: Diagnosis not present

## 2021-11-23 DIAGNOSIS — M79641 Pain in right hand: Secondary | ICD-10-CM | POA: Diagnosis not present

## 2021-11-30 ENCOUNTER — Encounter: Payer: Self-pay | Admitting: Internal Medicine

## 2021-11-30 NOTE — Telephone Encounter (Signed)
Patient called said she received a letter from her insurance stating we need to send the claim with modifiers and requested a call back to discuss further.

## 2021-11-30 NOTE — Telephone Encounter (Signed)
Patient notified of the response from [facility].

## 2021-12-02 ENCOUNTER — Encounter: Payer: Self-pay | Admitting: Internal Medicine

## 2021-12-02 DIAGNOSIS — F411 Generalized anxiety disorder: Secondary | ICD-10-CM | POA: Diagnosis not present

## 2021-12-02 DIAGNOSIS — F332 Major depressive disorder, recurrent severe without psychotic features: Secondary | ICD-10-CM | POA: Diagnosis not present

## 2021-12-02 DIAGNOSIS — F4310 Post-traumatic stress disorder, unspecified: Secondary | ICD-10-CM | POA: Diagnosis not present

## 2021-12-02 DIAGNOSIS — F900 Attention-deficit hyperactivity disorder, predominantly inattentive type: Secondary | ICD-10-CM | POA: Diagnosis not present

## 2021-12-02 NOTE — Progress Notes (Signed)
Subjective:    Patient ID: Pamela Novak, female    DOB: 1970/11/10, 51 y.o.   MRN: [id number]  This visit occurred during the SARS-CoV-2 public health emergency.  Safety protocols were in place, including screening questions prior to the visit, additional usage of staff PPE, and extensive cleaning of exam room while observing appropriate contact time as indicated for disinfecting solutions.     HPI The patient is here for follow up of their chronic medical problems, including prediabetes, hld, migraines, chronic neck pain  After TAH surgery stopped lipitor - started again 6 weeks ago and having increased pain - muscle and joint.  She wants to try a different medication.    She is not compliant with a  low sugar/carb diet.  She is not exercising.    Medications and allergies reviewed with patient and updated if appropriate.  Patient Active Problem List   Diagnosis Date Noted   Prolapse of female pelvic organs 06/29/2021   S/P laparoscopic assisted vaginal hysterectomy (LAVH) 06/29/2021   Throat irritation 11/26/2020   S/P Nissen fundoplication (without gastrostomy tube) procedure 09/05/2019   Chronic constipation 11/16/2018   Neck pain 09/21/2018   Migraine without aura and with status migrainosus, not intractable 04/12/2018   Bilateral low back pain 03/21/2018   Lumbar disc herniation with chronic lower back pain 03/21/2018   Prediabetes 03/23/2017   Seborrheic dermatitis of scalp 03/23/2017   Hyperlipidemia 01/21/2016   Depression 01/18/2016   PTSD (post-traumatic stress disorder) 01/18/2016   GERD (gastroesophageal reflux disease) 01/18/2016   Cough 11/11/2015   Varicose veins 05/21/2013   GAD (generalized anxiety disorder) 11/02/2012    Current Outpatient Medications on File Prior to Visit  Medication Sig Dispense Refill   Acetaminophen (TYLENOL 8 HOUR ARTHRITIS PAIN PO)      amphetamine-dextroamphetamine (ADDERALL) 30 MG tablet Take 1 tablet by mouth 2 (two)  times daily.     cloNIDine (CATAPRES) 0.2 MG tablet Take 0.2 mg by mouth at bedtime.     cyclobenzaprine (FLEXERIL) 5 MG tablet SMARTSIG:1-2 Tablet(s) By Mouth Every 8-12 Hours PRN     EPINEPHrine 0.3 mg/0.3 mL IJ SOAJ injection Inject 0.3 mg into the muscle as needed for anaphylaxis.     estradiol (ESTRACE) 0.1 MG/GM vaginal cream SMARTSIG:0.5 Gram(s) Vaginal 2-3 Times Weekly     gabapentin (NEURONTIN) 100 MG capsule 1 capsule     hydrocortisone (ANUSOL-HC) 2.5 % rectal cream PLACE 1 APPLICATION RECTALLY 2 (TWO) TIMES DAILY. 30 g 1   hydrocortisone (ANUSOL-HC) 25 MG suppository PLACE 1 SUPPOSITORY (25 MG TOTAL) RECTALLY DAILY AS NEEDED FOR HEMORRHOIDS. 12 suppository 5   LINZESS 72 MCG capsule Take 72 mcg by mouth daily.     LORazepam (ATIVAN) 1 MG tablet SMARTSIG:0.5-1 Tablet(s) By Mouth 1-3 Times Daily PRN     meloxicam (MOBIC) 7.5 MG tablet Take 7.5 mg by mouth daily.     ondansetron (ZOFRAN) 4 MG tablet Take 1 tablet (4 mg total) by mouth every 8 (eight) hours as needed for nausea or vomiting. 30 tablet 0   ondansetron (ZOFRAN-ODT) 4 MG disintegrating tablet 1 tablet on the tongue and allow to dissolve     polyethylene glycol powder (GLYCOLAX/MIRALAX) 17 GM/SCOOP powder Take half capful daily and titrate based on response to have 1-2 soft bowel movements daily. 255 g 1   predniSONE (DELTASONE) 5 MG tablet Take by mouth.     traZODone (DESYREL) 50 MG tablet Take 50-100 mg by mouth at bedtime.  triamcinolone cream (KENALOG) 0.1 % Apply 1 application topically 2 (two) times daily as needed.     Vitamin D, Ergocalciferol, (DRISDOL) 1.25 MG (50000 UNIT) CAPS capsule Take 50,000 Units by mouth once a week.     zolmitriptan (ZOMIG) 5 MG nasal solution Place 1 spray into the nose as needed for migraine. 6 each 0   No current facility-administered medications on file prior to visit.    Past Medical History:  Diagnosis Date   ADHD    Allergy    Anxiety    severe   Arthritis    Bruxism  (teeth grinding) 06/23/2021   WEARS  NIGHT GUARD WILL BRING DOS   Complication of anesthesia    "woke up during colonoscopy" age 16 years old   Constipation    Depression    Diverticulosis    Folliculitis 62/83/6629   ACNE PER PT   GERD (gastroesophageal reflux disease)    occ hiccup   Hemorrhoids    History of colon polyps    Hyperlipidemia    IBS (irritable bowel syndrome)    Migraines    Pre-diabetes    PTSD (post-traumatic stress disorder)    severe per psychiatry note dr Pearson Grippe 06-18-2021   Restless legs syndrome (RLS)    Sciatica    Right side   Varicose veins of lower extremity    in vagina also and both legs   Wears glasses 06/23/2021    Past Surgical History:  Procedure Laterality Date   24 HOUR [place] STUDY N/A 04/01/2019   Procedure: 24 HOUR PH STUDY;  Surgeon: Mauri Pole, MD;  Location: [facility];  Service: Endoscopy;  Laterality: N/A;   ABLATION ON ENDOMETRIOSIS  10/04/2012   Procedure: ABLATION ON ENDOMETRIOSIS;  Surgeon: Linda Hedges, DO;  Location: [facility];  Service: Gynecology;  Laterality: N/A;   ANTERIOR AND POSTERIOR REPAIR WITH SACROSPINOUS FIXATION N/A 06/29/2021   Procedure: ANTERIOR AND POSTERIOR REPAIR;  Surgeon: Linda Hedges, DO;  Location: [facility];  Service: Gynecology;  Laterality: N/A;   bravo test  03/11/2021   COLONOSCOPY  2019   69yr f/u   CONDYLOMA EXCISION/FULGURATION  10/04/2012   Procedure: CONDYLOMA REMOVAL;  Surgeon: Linda Hedges, DO;  Location: [facility];  Service: Gynecology;  Laterality: N/A;   CYSTOSCOPY N/A 06/29/2021   Procedure: CYSTOSCOPY;  Surgeon: Linda Hedges, DO;  Location: [facility];  Service: Gynecology;  Laterality: N/A;   DILITATION & CURRETTAGE/HYSTROSCOPY WITH NOVASURE ABLATION  10/04/2012   Procedure: DILATATION & CURETTAGE/HYSTEROSCOPY WITH NOVASURE ABLATION;  Surgeon: Linda Hedges, DO;  Location: [facility];  Service: Gynecology;  Laterality: N/A;   ESOPHAGEAL MANOMETRY N/A  04/01/2019   Procedure: ESOPHAGEAL MANOMETRY (EM);  Surgeon: Mauri Pole, MD;  Location: [facility];  Service: Endoscopy;  Laterality: N/A;   HEMORRHOID BANDING     multiple times yrs ago   LAPAROSCOPIC NISSEN FUNDOPLICATION N/A 47/65/4650   Procedure: LAPAROSCOPIC HIATAL HERNIA REPAIR AND NISSEN FUNDOPLICATION;  Surgeon: [name], MD;  Location: [facility];  Service: General;  Laterality: N/A;   LAPAROSCOPIC VAGINAL HYSTERECTOMY WITH SALPINGECTOMY Bilateral 06/29/2021   Procedure: LAPAROSCOPIC ASSISTED VAGINAL HYSTERECTOMY WITH BIILATERAL SALPINGECTOMY; CAUTERY OF VULVAR HEMANGIOMAS AND EXCISION OF LESION AT INTROITUS;  Surgeon: Linda Hedges, DO;  Location: [facility];  Service: Gynecology;  Laterality: Bilateral;   LAPAROSCOPY  10/04/2012   Procedure: LAPAROSCOPY OPERATIVE;  Surgeon: Linda Hedges, DO;  Location: [facility];  Service: Gynecology;  Laterality: N/A;   MASS EXCISION Bilateral  09/05/2019   Procedure: EXCISION BILATERAL SOFT TISSUE MASSES, THIGH;  Surgeon: [name], MD;  Location: [facility];  Service: General;  Laterality: Bilateral;   RADIOFREQUENCY ABLATION NERVES     sciatic nerve last 3 to 4 vertebrate lower and spine and sciatic nerve both legs   UPPER GI ENDOSCOPY  04/01/2019    Social History   Socioeconomic History   Marital status: Married    Spouse name: [name]   Number of children: 2   Years of education: 12+   Highest education level: Not on file  Occupational History   Occupation: adm Radiation protection practitioner: [employer]  Tobacco Use   Smoking status: Never   Smokeless tobacco: Never  Vaping Use   Vaping Use: Never used  Substance and Sexual Activity   Alcohol use: Not Currently   Drug use: No   Sexual activity: Yes  Other Topics Concern   Not on file  Social History Narrative   Regular exercise-no   Caffeine Use-yes   Social Determinants of Health   Financial Resource Strain: Not on file  Food Insecurity: Not on file   Transportation Needs: Not on file  Physical Activity: Not on file  Stress: Not on file  Social Connections: Not on file    Family History  Problem Relation Age of Onset   Heart disease Mother    Arthritis Mother    Hyperlipidemia Mother    Hypertension Mother    Heart attack Mother    Colon polyps Father    Hashimoto's thyroiditis Sister    Diabetes Sister    Thyroid disease Sister    Colon cancer Paternal Grandfather    Thyroid disease Sister     Review of Systems  Constitutional:  Negative for fever.  Respiratory:  Negative for cough, shortness of breath and wheezing.   Cardiovascular:  Positive for chest pain (occ with anxiety) and leg swelling (mild above socks). Negative for palpitations.  Gastrointestinal:        No gerd  Neurological:  Positive for headaches (migraines). Negative for light-headedness.      Objective:   Vitals:   12/03/21 0800  BP: 126/74  Pulse: 78  Temp: 98.1 F (36.7 C)  SpO2: 98%   BP Readings from Last 3 Encounters:  12/03/21 126/74  06/30/21 111/68  06/25/21 (!) 144/90   Wt Readings from Last 3 Encounters:  12/03/21 177 lb 12.8 oz (80.6 kg)  09/09/21 173 lb (78.5 kg)  06/29/21 167 lb 14.4 oz (76.2 kg)   Body mass index is 27.85 kg/m.   Depression screen [facility] 2/9 12/03/2021 11/26/2020 11/16/2018 04/12/2018 10/01/2012  Decreased Interest 1 0 1 2 0  Down, Depressed, Hopeless 1 0 - 1 0  PHQ - 2 Score 2 0 1 3 0  Altered sleeping 1 - 1 3 -  Tired, decreased energy 1 - 1 3 -  Change in appetite 1 - 1 2 -  Feeling bad or failure about yourself  1 - 1 0 -  Trouble concentrating 1 - 1 2 -  Moving slowly or fidgety/restless 0 - 1 1 -  Suicidal thoughts 1 - 0 0 -  PHQ-9 Score 8 - 7 14 -  Difficult doing work/chores Somewhat difficult - - Somewhat difficult -   GAD 7 : Generalized Anxiety Score 12/03/2021 11/16/2018  Nervous, Anxious, on Edge 2 2  Control/stop worrying 0 1  Worry too much - different things 1 1  Trouble relaxing 0 1  Restless 2 1  Easily annoyed or irritable 1 1  Afraid - awful might happen 0 1  Total GAD 7 Score 6 8  Anxiety Difficulty Somewhat difficult -      Physical Exam    Constitutional: Appears well-developed and well-nourished. No distress.  HENT:  Head: Normocephalic and atraumatic.  Neck: Neck supple. No tracheal deviation present. No thyromegaly present.  No cervical lymphadenopathy Cardiovascular: Normal rate, regular rhythm and normal heart sounds.   No murmur heard. No carotid bruit .  No edema Pulmonary/Chest: Effort normal and breath sounds normal. No respiratory distress. No has no wheezes. No rales.  Skin: Skin is warm and dry. Not diaphoretic.  Psychiatric: Normal mood and affect. Behavior is normal.      Assessment & Plan:    Flu immunization administered today.    See Problem List for Assessment and Plan of chronic medical problems.

## 2021-12-02 NOTE — Patient Instructions (Addendum)
°\ ° °  Flu immunization administered today.     Blood work was ordered.     Medications changes include :   crestor 5 mg three times a week  Your prescription(s) have been submitted to your pharmacy. Please take as directed and contact our office if you believe you are having problem(s) with the medication(s).   Please followup in 6 months

## 2021-12-03 ENCOUNTER — Ambulatory Visit (INDEPENDENT_AMBULATORY_CARE_PROVIDER_SITE_OTHER): Payer: Federal, State, Local not specified - PPO | Admitting: Internal Medicine

## 2021-12-03 ENCOUNTER — Other Ambulatory Visit: Payer: Self-pay

## 2021-12-03 VITALS — BP 126/74 | HR 78 | Temp 98.1°F | Ht 67.0 in | Wt 177.8 lb

## 2021-12-03 DIAGNOSIS — Z23 Encounter for immunization: Secondary | ICD-10-CM | POA: Diagnosis not present

## 2021-12-03 DIAGNOSIS — R7303 Prediabetes: Secondary | ICD-10-CM

## 2021-12-03 DIAGNOSIS — K219 Gastro-esophageal reflux disease without esophagitis: Secondary | ICD-10-CM

## 2021-12-03 DIAGNOSIS — G43001 Migraine without aura, not intractable, with status migrainosus: Secondary | ICD-10-CM

## 2021-12-03 DIAGNOSIS — F909 Attention-deficit hyperactivity disorder, unspecified type: Secondary | ICD-10-CM

## 2021-12-03 DIAGNOSIS — E7849 Other hyperlipidemia: Secondary | ICD-10-CM | POA: Diagnosis not present

## 2021-12-03 DIAGNOSIS — M542 Cervicalgia: Secondary | ICD-10-CM

## 2021-12-03 MED ORDER — ROSUVASTATIN CALCIUM 5 MG PO TABS: 5.0000 mg | ORAL_TABLET | ORAL | 3 refills | Status: DC

## 2021-12-03 NOTE — Assessment & Plan Note (Signed)
Denies GERD

## 2021-12-03 NOTE — Assessment & Plan Note (Signed)
Chronic Taking gabapentin as needed

## 2021-12-03 NOTE — Assessment & Plan Note (Signed)
Chronic Check a1c Low sugar / carb diet Stressed regular exercise  

## 2021-12-03 NOTE — Assessment & Plan Note (Addendum)
Chronic Atorvastatin is causing muscle/joint/bone pain-we will discontinue Discussed options Start Crestor 5 mg 3 times a week Check lipid panel, CMP in about 6 weeks Discussed if she does have side effects we can consider a different medication

## 2021-12-03 NOTE — Assessment & Plan Note (Addendum)
Chronic Continue Zofran 4 mg daily as needed, Zomig 5 mg nasal spray as needed Above regimen is effective

## 2021-12-03 NOTE — Assessment & Plan Note (Signed)
More recent diagnosis Following with psychiatry On Adderall 30 mg twice daily-this has been helpful Many of her positive scores for depression and anxiety are related to her ADHD

## 2021-12-21 DIAGNOSIS — M1990 Unspecified osteoarthritis, unspecified site: Secondary | ICD-10-CM | POA: Diagnosis not present

## 2021-12-21 DIAGNOSIS — E559 Vitamin D deficiency, unspecified: Secondary | ICD-10-CM | POA: Diagnosis not present

## 2021-12-21 DIAGNOSIS — Z79899 Other long term (current) drug therapy: Secondary | ICD-10-CM | POA: Diagnosis not present

## 2021-12-21 DIAGNOSIS — M791 Myalgia, unspecified site: Secondary | ICD-10-CM | POA: Diagnosis not present

## 2021-12-23 DIAGNOSIS — F411 Generalized anxiety disorder: Secondary | ICD-10-CM | POA: Diagnosis not present

## 2021-12-23 DIAGNOSIS — F900 Attention-deficit hyperactivity disorder, predominantly inattentive type: Secondary | ICD-10-CM | POA: Diagnosis not present

## 2021-12-23 DIAGNOSIS — F332 Major depressive disorder, recurrent severe without psychotic features: Secondary | ICD-10-CM | POA: Diagnosis not present

## 2021-12-23 DIAGNOSIS — F4310 Post-traumatic stress disorder, unspecified: Secondary | ICD-10-CM | POA: Diagnosis not present

## 2021-12-27 ENCOUNTER — Ambulatory Visit (INDEPENDENT_AMBULATORY_CARE_PROVIDER_SITE_OTHER)
Admit: 2021-12-27 | Discharge: 2021-12-27 | Disposition: A | Discharge status: Home / Self Care | Payer: Federal, State, Local not specified - PPO | Attending: Internal Medicine | Admitting: Internal Medicine

## 2021-12-27 ENCOUNTER — Encounter: Payer: Self-pay | Admitting: Emergency Medicine

## 2021-12-27 ENCOUNTER — Ambulatory Visit
Admission: EM | Admit: 2021-12-27 | Discharge: 2021-12-27 | Disposition: A | Discharge status: Home / Self Care | Payer: Federal, State, Local not specified - PPO

## 2021-12-27 ENCOUNTER — Other Ambulatory Visit: Payer: Self-pay

## 2021-12-27 DIAGNOSIS — R051 Acute cough: Secondary | ICD-10-CM

## 2021-12-27 DIAGNOSIS — Z209 Contact with and (suspected) exposure to unspecified communicable disease: Secondary | ICD-10-CM | POA: Diagnosis not present

## 2021-12-27 DIAGNOSIS — J209 Acute bronchitis, unspecified: Secondary | ICD-10-CM

## 2021-12-27 DIAGNOSIS — R0689 Other abnormalities of breathing: Secondary | ICD-10-CM

## 2021-12-27 DIAGNOSIS — J410 Simple chronic bronchitis: Secondary | ICD-10-CM | POA: Diagnosis not present

## 2021-12-27 DIAGNOSIS — R079 Chest pain, unspecified: Secondary | ICD-10-CM | POA: Diagnosis not present

## 2021-12-27 DIAGNOSIS — R059 Cough, unspecified: Secondary | ICD-10-CM | POA: Diagnosis not present

## 2021-12-27 MED ORDER — ALBUTEROL SULFATE 1.25 MG/3ML IN NEBU
1.0000 | INHALATION_SOLUTION | RESPIRATORY_TRACT | 0 refills | Status: DC | PRN
Start: 2021-12-27 — End: 2022-05-05

## 2021-12-27 MED ORDER — AEROCHAMBER PLUS FLO-VU LARGE MISC: 1.0000 | Freq: Once | 0 refills | Status: AC

## 2021-12-27 MED ORDER — FLUCONAZOLE 150 MG PO TABS: ORAL_TABLET | ORAL | 0 refills | Status: DC

## 2021-12-27 MED ORDER — OSELTAMIVIR PHOSPHATE 75 MG PO CAPS: 75.0000 mg | ORAL_CAPSULE | Freq: Two times a day (BID) | ORAL | 0 refills | Status: AC

## 2021-12-27 MED ORDER — PROMETHAZINE-DM 6.25-15 MG/5ML PO SYRP: 5.0000 mL | ORAL_SOLUTION | Freq: Four times a day (QID) | ORAL | 0 refills | Status: DC | PRN

## 2021-12-27 MED ORDER — ALBUTEROL SULFATE HFA 108 (90 BASE) MCG/ACT IN AERS: 2.0000 | INHALATION_SPRAY | Freq: Four times a day (QID) | RESPIRATORY_TRACT | 0 refills | Status: DC | PRN

## 2021-12-27 MED ORDER — GUAIFENESIN 400 MG PO TABS: ORAL_TABLET | ORAL | 0 refills | Status: DC

## 2021-12-27 MED ORDER — AMOXICILLIN-POT CLAVULANATE 875-125 MG PO TABS: 1.0000 | ORAL_TABLET | Freq: Two times a day (BID) | ORAL | 0 refills | Status: AC

## 2021-12-27 MED ORDER — ALBUTEROL SULFATE (2.5 MG/3ML) 0.083% IN NEBU
2.5000 mg | INHALATION_SOLUTION | Freq: Once | RESPIRATORY_TRACT | Status: AC
  Administered 2021-12-27: 2.5 mg via RESPIRATORY_TRACT

## 2021-12-27 NOTE — Discharge Instructions (Addendum)
Your symptoms and physical exam findings are concerning for a viral respiratory infection.  Based on my physical exam findings, I believe you are suffering from bronchitis.  Per my personal read of your chest x-ray, I am concerned that you do have an evolving pneumonia at the right and left lung bases, the radiologist report is still pending.   You were tested for both COVID-19 and influenza today, the result of your viral testing will be posted to your MyChart once it is complete, this typically takes 24 to 48 hours.  If there is a positive result, you will be contacted by phone with further recommendations, if any.    I recommend that you begin taking Tamiflu now for empiric treatment of presumed influenza based on the history provided to me today along with my physical exam findings. Tamiflu is an antiviral medication that decreases the severity, duration and transmissibility of influenza virus by preventing the virus from reproducing itself in your body.     If the influenza result is positive, please continue the full 5-day course of Tamiflu.  If the result is negative, please feel free to discontinue Tamiflu if you prefer but do keep in mind that Tamiflu can also be taken preventatively.  Finishing the full 5-day course will decrease the chances of catching influenza from anyone else and will not cause harm otherwise.  If your COVID-19 test is positive, you will be provided with a prescription for Paxlovid instead, this is the antiviral medication used to treat COVID-19.    Please see the list below for recommended medications, dosages and frequencies to provide relief of your current symptoms:     Amoxicillin-clavulanate (Augmentin): To treat you for possible bacterial pneumonia, please take 1 tablet twice daily for 5 days, you can take it with or without food.  This antibiotic can cause upset stomach, this will resolve once antibiotics are complete.  You are welcome to use a probiotic, eat yogurt,  take Imodium while taking this medication.  Please avoid other systemic medications such as Maalox, Pepto-Bismol or milk of magnesia as they can interfere with your body's ability to absorb the antibiotics.  I provided you with a prescription for Diflucan as well in the event that you develop a vaginal yeast infection caused by taking antibiotics.   Albuterol HFA: This is a bronchodilator, it relaxes the smooth muscles that constrict your airway in your lungs when you are feeling sick or having inflammation secondary to allergies or upper respiratory infection.  Please inhale 2 puffs twice daily every day using the spacer provided.  You can also inhale 2 more puffs as often as needed throughout the day for aggravating cough, chest tightness, feeling short of breath, wheezing.   We have also provided you with a nebulizer machine and albuterol Respules to use in the machine, the inhaler and the nebulizer treatments are interchangeable.   Guaifenesin (Robitussin, Mucinex): This is an expectorant.  This helps break up chest congestion and loosen up thick nasal drainage making phlegm and drainage more liquid and therefore easier to remove.  I recommend being 400 mg three times daily as needed.      Promethazine DM: Promethazine is both a nasal decongestant and an antinausea medication that makes most patients feel fairly sleepy.  The DM is dextromethorphan, a cough suppressant found in many over-the-counter cough medications.  Please take 5 mL before bedtime to minimize your cough which will help you sleep better.  I have provided you with a prescription  for this medication.      Please remain home from work, school, public places until you have been symptom-free for 24 hours without the use of medications.  Please follow-up within the next 3 to 5 days either with your primary care provider or urgent care if your symptoms do not resolve.  If you do not have a primary care provider, we will assist you in finding  one.   Thank you for visiting urgent care today.  We appreciate the opportunity to participate in your care.

## 2021-12-27 NOTE — ED Provider Notes (Signed)
[facility]    CSN: [id number] Arrival date & time: 12/27/21  1142    HISTORY   Chief Complaint  Patient presents with   Cough   HPI Pamela Novak is a 51 y.o. female. Patient presents to urgent care today with a chief complaint of cough and fever.  Patient's heart rate is elevated on arrival with a temperature of 100.8.  Patient's oxygen saturation is 95%.  Patient reports a history of seronegative rheumatoid arthritis recently started on prednisone and methotrexate, has only had 1 dose of methotrexate.  Patient states that her son has been sick for the past week, developed pneumonia and ended up in the hospital onto IV antibiotics.  Patient states she called her RA doctor, states that her doctor advised her to stop methotrexate and to be checked for infection.  Patient states the cough is productive of thick green sputum, has been taking Mucinex with some relief but feels that she cannot cough enough to get "everything out".  States she has not been this sick since she was a child.  Patient reports decreased appetite, myalgia, ache and burning pain in her chest when she coughs.  Patient states she also feels little bit short of breath when she coughs.  The history is provided by the patient.  Past Medical History:  Diagnosis Date   ADHD    Allergy    Anxiety    severe   Arthritis    Bruxism (teeth grinding) 06/23/2021   WEARS  NIGHT GUARD WILL BRING DOS   Complication of anesthesia    "woke up during colonoscopy" age 11 years old   Constipation    Depression    Diverticulosis    Folliculitis 64/40/3474   ACNE PER PT   GERD (gastroesophageal reflux disease)    occ hiccup   Hemorrhoids    History of colon polyps    Hyperlipidemia    IBS (irritable bowel syndrome)    Migraines    Pre-diabetes    PTSD (post-traumatic stress disorder)    severe per psychiatry note dr Pearson Grippe 06-18-2021   Restless legs syndrome (RLS)    Sciatica    Right side   Varicose  veins of lower extremity    in vagina also and both legs   Wears glasses 06/23/2021   Patient Active Problem List   Diagnosis Date Noted   ADHD 12/03/2021   Prolapse of female pelvic organs 06/29/2021   S/P laparoscopic assisted vaginal hysterectomy (LAVH) 06/29/2021   S/P Nissen fundoplication (without gastrostomy tube) procedure 09/05/2019   Chronic constipation 11/16/2018   Neck pain 09/21/2018   Migraine without aura and with status migrainosus, not intractable 04/12/2018   Bilateral low back pain 03/21/2018   Lumbar disc herniation with chronic lower back pain 03/21/2018   Prediabetes 03/23/2017   Seborrheic dermatitis of scalp 03/23/2017   Hyperlipidemia 01/21/2016   Depression 01/18/2016   PTSD (post-traumatic stress disorder) 01/18/2016   GERD (gastroesophageal reflux disease) 01/18/2016   Cough 11/11/2015   Varicose veins 05/21/2013   GAD (generalized anxiety disorder) 11/02/2012   Past Surgical History:  Procedure Laterality Date   24 HOUR [place] STUDY N/A 04/01/2019   Procedure: 24 HOUR PH STUDY;  Surgeon: Mauri Pole, MD;  Location: [facility];  Service: Endoscopy;  Laterality: N/A;   ABLATION ON ENDOMETRIOSIS  10/04/2012   Procedure: ABLATION ON ENDOMETRIOSIS;  Surgeon: Linda Hedges, DO;  Location: [name];  Service: Gynecology;  Laterality: N/A;   ANTERIOR AND POSTERIOR  REPAIR WITH SACROSPINOUS FIXATION N/A 06/29/2021   Procedure: ANTERIOR AND POSTERIOR REPAIR;  Surgeon: Linda Hedges, DO;  Location: [place];  Service: Gynecology;  Laterality: N/A;   bravo test  03/11/2021   COLONOSCOPY  2019   15yr f/u   CONDYLOMA EXCISION/FULGURATION  10/04/2012   Procedure: CONDYLOMA REMOVAL;  Surgeon: Linda Hedges, DO;  Location: [facility];  Service: Gynecology;  Laterality: N/A;   CYSTOSCOPY N/A 06/29/2021   Procedure: CYSTOSCOPY;  Surgeon: Linda Hedges, DO;  Location: [place];  Service: Gynecology;  Laterality: N/A;   DILITATION &  CURRETTAGE/HYSTROSCOPY WITH NOVASURE ABLATION  10/04/2012   Procedure: DILATATION & CURETTAGE/HYSTEROSCOPY WITH NOVASURE ABLATION;  Surgeon: Linda Hedges, DO;  Location: [facility];  Service: Gynecology;  Laterality: N/A;   ESOPHAGEAL MANOMETRY N/A 04/01/2019   Procedure: ESOPHAGEAL MANOMETRY (EM);  Surgeon: Mauri Pole, MD;  Location: [facility];  Service: Endoscopy;  Laterality: N/A;   HEMORRHOID BANDING     multiple times yrs ago   LAPAROSCOPIC NISSEN FUNDOPLICATION N/A 42/70/6237   Procedure: LAPAROSCOPIC HIATAL HERNIA REPAIR AND NISSEN FUNDOPLICATION;  Surgeon: [name], MD;  Location: [facility];  Service: General;  Laterality: N/A;   LAPAROSCOPIC VAGINAL HYSTERECTOMY WITH SALPINGECTOMY Bilateral 06/29/2021   Procedure: LAPAROSCOPIC ASSISTED VAGINAL HYSTERECTOMY WITH BIILATERAL SALPINGECTOMY; CAUTERY OF VULVAR HEMANGIOMAS AND EXCISION OF LESION AT INTROITUS;  Surgeon: Linda Hedges, DO;  Location: [place];  Service: Gynecology;  Laterality: Bilateral;   LAPAROSCOPY  10/04/2012   Procedure: LAPAROSCOPY OPERATIVE;  Surgeon: Linda Hedges, DO;  Location: [facility];  Service: Gynecology;  Laterality: N/A;   MASS EXCISION Bilateral 09/05/2019   Procedure: EXCISION BILATERAL SOFT TISSUE MASSES, THIGH;  Surgeon: [name], MD;  Location: [facility];  Service: General;  Laterality: Bilateral;   RADIOFREQUENCY ABLATION NERVES     sciatic nerve last 3 to 4 vertebrate lower and spine and sciatic nerve both legs   UPPER GI ENDOSCOPY  04/01/2019   OB History   No obstetric history on file.    Home Medications    Prior to Admission medications   Medication Sig Start Date End Date Taking? Authorizing Provider  Acetaminophen (TYLENOL 8 HOUR ARTHRITIS PAIN PO)  08/18/21   [provider]  amphetamine-dextroamphetamine (ADDERALL) 30 MG tablet Take 1 tablet by mouth 2 (two) times daily. 10/26/20   [provider]  cloNIDine (CATAPRES) 0.2 MG tablet Take 0.2 mg by  mouth at bedtime. 11/06/21   [provider]  cyclobenzaprine (FLEXERIL) 5 MG tablet SMARTSIG:1-2 Tablet(s) By Mouth Every 8-12 Hours PRN 11/21/21   [provider]  EPINEPHrine 0.3 mg/0.3 mL IJ SOAJ injection Inject 0.3 mg into the muscle as needed for anaphylaxis.    [provider]  estradiol (ESTRACE) 0.1 MG/GM vaginal cream SMARTSIG:0.5 Gram(s) Vaginal 2-3 Times Weekly 10/27/21   [provider]  gabapentin (NEURONTIN) 100 MG capsule 1 capsule    [provider]  hydrocortisone (ANUSOL-HC) 2.5 % rectal cream PLACE 1 APPLICATION RECTALLY 2 (TWO) TIMES DAILY. 01/04/21   Mauri Pole, MD  hydrocortisone (ANUSOL-HC) 25 MG suppository PLACE 1 SUPPOSITORY (25 MG TOTAL) RECTALLY DAILY AS NEEDED FOR HEMORRHOIDS. 02/22/21   [name], MD  LINZESS 72 MCG capsule Take 72 mcg by mouth daily. 08/25/21   [provider]  LORazepam (ATIVAN) 1 MG tablet SMARTSIG:0.5-1 Tablet(s) By Mouth 1-3 Times Daily PRN 10/20/21   [provider]  meloxicam (MOBIC) 7.5 MG tablet Take 7.5 mg by mouth daily. 09/01/21  [provider]  methotrexate (RHEUMATREX) 2.5 MG tablet Take 10 mg by mouth once a week. 12/21/21   [provider]  ondansetron (ZOFRAN) 4 MG tablet Take 1 tablet (4 mg total) by mouth every 8 (eight) hours as needed for nausea or vomiting. 11/06/19   Mauri Pole, MD  ondansetron (ZOFRAN-ODT) 4 MG disintegrating tablet 1 tablet on the tongue and allow to dissolve    [provider]  polyethylene glycol powder (GLYCOLAX/MIRALAX) 17 GM/SCOOP powder Take half capful daily and titrate based on response to have 1-2 soft bowel movements daily. 03/08/19   Mauri Pole, MD  predniSONE (DELTASONE) 5 MG tablet Take by mouth. 11/24/21   [provider]  rosuvastatin (CRESTOR) 5 MG tablet Take 1 tablet (5 mg total) by mouth 3 (three) times a week. 12/03/21   [name], MD  traZODone (DESYREL) 50 MG tablet  Take 50-100 mg by mouth at bedtime. 11/23/21   [provider]  triamcinolone cream (KENALOG) 0.1 % Apply 1 application topically 2 (two) times daily as needed. 11/21/21   [provider]  Vitamin D, Ergocalciferol, (DRISDOL) 1.25 MG (50000 UNIT) CAPS capsule Take 50,000 Units by mouth once a week. 11/24/21   [provider]  zolmitriptan (ZOMIG) 5 MG nasal solution Place 1 spray into the nose as needed for migraine. 05/31/21   [name], MD   Family History Family History  Problem Relation Age of Onset   Heart disease Mother    Arthritis Mother    Hyperlipidemia Mother    Hypertension Mother    Heart attack Mother    Colon polyps Father    Hashimoto's thyroiditis Sister    Diabetes Sister    Thyroid disease Sister    Colon cancer Paternal Grandfather    Thyroid disease Sister    Social History Social History   Tobacco Use   Smoking status: Never   Smokeless tobacco: Never  Vaping Use   Vaping Use: Never used  Substance Use Topics   Alcohol use: Not Currently   Drug use: No   Allergies   Capsaicin, Black pepper-turmeric, Nsaids, Other, Atorvastatin, and Flagyl [metronidazole]  Review of Systems Review of Systems Pertinent findings noted in history of present illness.   Physical Exam Triage Vital Signs ED Triage Vitals  Enc Vitals Group     BP 08/27/21 0827 (!) 147/82     Pulse Rate 08/27/21 0827 72     Resp 08/27/21 0827 18     Temp 08/27/21 0827 98.3 F (36.8 C)     Temp Source 08/27/21 0827 Oral     SpO2 08/27/21 0827 98 %     Weight --      Height --      Head Circumference --      Peak Flow --      Pain Score 08/27/21 0826 5     Pain Loc --      Pain Edu? --      Excl. in [place]? --   No data found.  Updated Vital Signs BP 104/72 (BP Location: Left Arm)    Pulse (!) 108    Temp (!) 100.8 F (38.2 C)    Resp 17    LMP 01/19/2020    SpO2 95%   Physical Exam Vitals and nursing note reviewed.  Constitutional:      General: She  is not in acute distress.    Appearance: Normal appearance. She is not ill-appearing.  HENT:  Head: Normocephalic and atraumatic.     Salivary Glands: Right salivary gland is not diffusely enlarged or tender. Left salivary gland is not diffusely enlarged or tender.     Right Ear: Tympanic membrane, ear canal and external ear normal. No drainage. No middle ear effusion. There is no impacted cerumen. Tympanic membrane is not erythematous or bulging.     Left Ear: Tympanic membrane, ear canal and external ear normal. No drainage.  No middle ear effusion. There is no impacted cerumen. Tympanic membrane is not erythematous or bulging.     Nose: Nose normal. No nasal deformity, septal deviation, mucosal edema, congestion or rhinorrhea.     Right Turbinates: Not enlarged, swollen or pale.     Left Turbinates: Not enlarged, swollen or pale.     Right Sinus: No maxillary sinus tenderness or frontal sinus tenderness.     Left Sinus: No maxillary sinus tenderness or frontal sinus tenderness.     Mouth/Throat:     Lips: Pink. No lesions.     Mouth: Mucous membranes are moist. No oral lesions.     Pharynx: Oropharynx is clear. Uvula midline. No posterior oropharyngeal erythema or uvula swelling.     Tonsils: No tonsillar exudate. 0 on the right. 0 on the left.  Eyes:     General: Lids are normal.        Right eye: No discharge.        Left eye: No discharge.     Extraocular Movements: Extraocular movements intact.     Conjunctiva/sclera: Conjunctivae normal.     Right eye: Right conjunctiva is not injected.     Left eye: Left conjunctiva is not injected.  Neck:     Trachea: Trachea and phonation normal.  Cardiovascular:     Rate and Rhythm: Normal rate and regular rhythm.     Pulses: Normal pulses.     Heart sounds: Normal heart sounds. No murmur heard.   No friction rub. No gallop.  Pulmonary:     Effort: Pulmonary effort is normal. No accessory muscle usage, prolonged expiration or  respiratory distress.     Breath sounds: Decreased air movement present. No stridor or transmitted upper airway sounds. Examination of the right-upper field reveals decreased breath sounds. Examination of the left-upper field reveals decreased breath sounds. Examination of the right-middle field reveals decreased breath sounds. Examination of the left-middle field reveals decreased breath sounds. Examination of the right-lower field reveals decreased breath sounds and rales. Examination of the left-lower field reveals decreased breath sounds and rales. Decreased breath sounds and rales present. No wheezing or rhonchi.  Chest:     Chest wall: No tenderness.  Musculoskeletal:        General: Normal range of motion.     Cervical back: Normal range of motion and neck supple. Normal range of motion.  Lymphadenopathy:     Cervical: No cervical adenopathy.  Skin:    General: Skin is warm and dry.     Findings: No erythema or rash.  Neurological:     General: No focal deficit present.     Mental Status: She is alert and oriented to person, place, and time.  Psychiatric:        Mood and Affect: Mood normal.        Behavior: Behavior normal.    Visual Acuity Right Eye Distance:   Left Eye Distance:   Bilateral Distance:    Right Eye Near:   Left Eye Near:    Bilateral Near:  [facility] Couse / Diagnostics / Procedures:    EKG  Radiology No results found.  Procedures Procedures (including critical care time)  UC Diagnoses / Final Clinical Impressions(s)   I have reviewed the triage vital signs and the nursing notes.  Pertinent labs & imaging results that were available during my care of the patient were reviewed by me and considered in my medical decision making (see chart for details).   Final diagnoses:  Acute cough  Acute bronchitis, unspecified organism  Bronchospasm with bronchitis, acute  Decreased breath sounds at left lung base  Decreased breath sounds at right lung base   Exposure to communicable disease   Patient had a robust response to albuterol neb, breath sounds in upper airways significantly improved however fine rales remain in both lower lung fields.  Checks x-ray today shows  COVID/flu test pending.  Recommend patient begin Tamiflu now given acute onset of symptoms and being inside the 48-hour window for benefit.  Patient advised that if her COVID test is positive, she will be provided with Paxlovid instead.  Given that her son had a significant bacterial pneumonia and she is coughing up thick green sputum, recommend that she also begin Augmentin now.  Patient states she is currently taking prednisone 10 mg daily for her RA, recommend she continue taking that.  Patient will be provided with albuterol inhaler at home, recommend use 4 times a day.  Continue Mucinex, Promethazine DM added for nighttime cough so patient can sleep.  Conservative care recommended.  ED Prescriptions     Medication Sig Dispense Auth. Provider   albuterol (VENTOLIN HFA) 108 (90 Base) MCG/ACT inhaler Inhale 2 puffs into the lungs every 6 (six) hours as needed for wheezing or shortness of breath (Cough). 18 g Lynden Oxford Scales, PA-C   amoxicillin-clavulanate (AUGMENTIN) 875-125 MG tablet Take 1 tablet by mouth 2 (two) times daily for 7 days. 14 tablet Lynden Oxford Scales, PA-C   Spacer/Aero-Holding Chambers (AEROCHAMBER PLUS FLO-VU LARGE) MISC 1 each by Other route once for 1 dose. 1 each Lynden Oxford Scales, PA-C   oseltamivir (TAMIFLU) 75 MG capsule Take 1 capsule (75 mg total) by mouth every 12 (twelve) hours for 5 days. 10 capsule Lynden Oxford Scales, PA-C   guaifenesin (HUMIBID E) 400 MG TABS tablet Take 1 tablet 3 times daily as needed for chest congestion and cough 21 tablet Lynden Oxford Scales, PA-C   promethazine-dextromethorphan (PROMETHAZINE-DM) 6.25-15 MG/5ML syrup Take 5 mLs by mouth 4 (four) times daily as needed for cough. 180 mL Lynden Oxford  Scales, PA-C   fluconazole (DIFLUCAN) 150 MG tablet Take 1 tablet on day 4 of antibiotics.  Take second tablet 3 days later. 2 tablet Lynden Oxford Scales, PA-C      PDMP not reviewed this encounter.  Pending results:  Labs Reviewed  COVID-19, FLU A+B NAA    Medications Ordered in UC: Medications  albuterol (PROVENTIL) (2.5 MG/3ML) 0.083% nebulizer solution 2.5 mg (2.5 mg Nebulization Given 12/27/21 1300)    Disposition Upon Discharge:  Condition: stable for discharge home Home: take medications as prescribed; routine discharge instructions as discussed; follow up as advised.  Patient presented with an acute illness with associated systemic symptoms and significant discomfort requiring urgent management. In my opinion, this is a condition that a prudent lay person (someone who possesses an average knowledge of health and medicine) may potentially expect to result in complications if not addressed urgently such as respiratory distress, impairment of bodily function or dysfunction of bodily organs.  Routine symptom specific, illness specific and/or disease specific instructions were discussed with the patient and/or caregiver at length.   As such, the patient has been evaluated and assessed, work-up was performed and treatment was provided in alignment with urgent care protocols and evidence based medicine.  Patient/parent/caregiver has been advised that the patient may require follow up for further testing and treatment if the symptoms continue in spite of treatment, as clinically indicated and appropriate.  If the patient was tested for COVID-19, Influenza and/or RSV, then the patient/parent/guardian was advised to isolate at home pending the results of his/her diagnostic coronavirus test and potentially longer if theyre positive. I have also advised pt that if his/her COVID-19 test returns positive, it's recommended to self-isolate for at least 10 days after symptoms first appeared AND  until fever-free for 24 hours without fever reducer AND other symptoms have improved or resolved. Discussed self-isolation recommendations as well as instructions for household member/close contacts as per the [facility] and [place] [facility], and also gave patient the [place] packet with this information.  Patient/parent/caregiver has been advised to return to the [facility] or PCP in 3-5 days if no better; to PCP or the Emergency Department if new signs and symptoms develop, or if the current signs or symptoms continue to change or worsen for further workup, evaluation and treatment as clinically indicated and appropriate  The patient will follow up with their current PCP if and as advised. If the patient does not currently have a PCP we will assist them in obtaining one.   The patient may need specialty follow up if the symptoms continue, in spite of conservative treatment and management, for further workup, evaluation, consultation and treatment as clinically indicated and appropriate.  Patient/parent/caregiver verbalized understanding and agreement of plan as discussed.  All questions were addressed during visit.  Please see discharge instructions below for further details of plan.  Discharge Instructions:   Discharge Instructions      Your symptoms and physical exam findings are concerning for a viral respiratory infection.  Based on my physical exam findings, I believe you are suffering from bronchitis.  Per my personal read of your chest x-ray, I am concerned that you do have an evolving pneumonia at the right and left lung bases, the radiologist report is still pending.   You were tested for both COVID-19 and influenza today, the result of your viral testing will be posted to your MyChart once it is complete, this typically takes 24 to 48 hours.  If there is a positive result, you will be contacted by phone with further recommendations, if any.    I recommend that you begin taking Tamiflu now for empiric  treatment of presumed influenza based on the history provided to me today along with my physical exam findings. Tamiflu is an antiviral medication that decreases the severity, duration and transmissibility of influenza virus by preventing the virus from reproducing itself in your body.     If the influenza result is positive, please continue the full 5-day course of Tamiflu.  If the result is negative, please feel free to discontinue Tamiflu if you prefer but do keep in mind that Tamiflu can also be taken preventatively.  Finishing the full 5-day course will decrease the chances of catching influenza from anyone else and will not cause harm otherwise.  If your COVID-19 test is positive, you will be provided with a prescription for Paxlovid instead, this is the antiviral medication used to treat COVID-19.    Please  see the list below for recommended medications, dosages and frequencies to provide relief of your current symptoms:     Amoxicillin-clavulanate (Augmentin): To treat you for possible bacterial pneumonia, please take 1 tablet twice daily for 5 days, you can take it with or without food.  This antibiotic can cause upset stomach, this will resolve once antibiotics are complete.  You are welcome to use a probiotic, eat yogurt, take Imodium while taking this medication.  Please avoid other systemic medications such as Maalox, Pepto-Bismol or milk of magnesia as they can interfere with your body's ability to absorb the antibiotics.  I provided you with a prescription for Diflucan as well in the event that you develop a vaginal yeast infection caused by taking antibiotics.   Albuterol HFA: This is a bronchodilator, it relaxes the smooth muscles that constrict your airway in your lungs when you are feeling sick or having inflammation secondary to allergies or upper respiratory infection.  Please inhale 2 puffs twice daily every day using the spacer provided.  You can also inhale 2 more puffs as often as  needed throughout the day for aggravating cough, chest tightness, feeling short of breath, wheezing.      Guaifenesin (Robitussin, Mucinex): This is an expectorant.  This helps break up chest congestion and loosen up thick nasal drainage making phlegm and drainage more liquid and therefore easier to remove.  I recommend being 400 mg three times daily as needed.      Promethazine DM: Promethazine is both a nasal decongestant and an antinausea medication that makes most patients feel fairly sleepy.  The DM is dextromethorphan, a cough suppressant found in many over-the-counter cough medications.  Please take 5 mL before bedtime to minimize your cough which will help you sleep better.  I have provided you with a prescription for this medication.      Please remain home from work, school, public places until you have been symptom-free for 24 hours without the use of medications.  Please follow-up within the next 3 to 5 days either with your primary care provider or urgent care if your symptoms do not resolve.  If you do not have a primary care provider, we will assist you in finding one.   Thank you for visiting urgent care today.  We appreciate the opportunity to participate in your care.     This office note has been dictated using Museum/gallery curator.  Unfortunately, and despite my best efforts, this method of dictation can sometimes lead to occasional typographical or grammatical errors.  I apologize in advance if this occurs.     Lynden Oxford Scales, PA-C 12/27/21 1347

## 2021-12-27 NOTE — ED Triage Notes (Signed)
Pt reports that she been caring for her son who has PNA. Reports for couple days had cough, fatigue, chest pains with coughing and feeling little SOB.  RA doctor told patient to stop her new medication and go be checked for an infection.

## 2021-12-28 ENCOUNTER — Telehealth: Payer: Federal, State, Local not specified - PPO | Admitting: Family Medicine

## 2021-12-28 LAB — COVID-19, FLU A+B NAA
Influenza A, NAA: NOT DETECTED
Influenza B, NAA: NOT DETECTED
SARS-CoV-2, NAA: NOT DETECTED

## 2021-12-31 ENCOUNTER — Encounter: Payer: Self-pay | Admitting: Internal Medicine

## 2022-01-06 ENCOUNTER — Encounter: Payer: Self-pay | Admitting: Internal Medicine

## 2022-01-11 ENCOUNTER — Encounter: Payer: Federal, State, Local not specified - PPO | Admitting: Physician Assistant

## 2022-01-11 DIAGNOSIS — F900 Attention-deficit hyperactivity disorder, predominantly inattentive type: Secondary | ICD-10-CM | POA: Diagnosis not present

## 2022-01-11 DIAGNOSIS — F4310 Post-traumatic stress disorder, unspecified: Secondary | ICD-10-CM | POA: Diagnosis not present

## 2022-01-11 DIAGNOSIS — F332 Major depressive disorder, recurrent severe without psychotic features: Secondary | ICD-10-CM | POA: Diagnosis not present

## 2022-01-11 DIAGNOSIS — F411 Generalized anxiety disorder: Secondary | ICD-10-CM | POA: Diagnosis not present

## 2022-01-17 ENCOUNTER — Encounter: Payer: Self-pay | Admitting: Internal Medicine

## 2022-01-17 DIAGNOSIS — M1990 Unspecified osteoarthritis, unspecified site: Secondary | ICD-10-CM | POA: Diagnosis not present

## 2022-01-17 DIAGNOSIS — E559 Vitamin D deficiency, unspecified: Secondary | ICD-10-CM | POA: Diagnosis not present

## 2022-01-17 DIAGNOSIS — Z79899 Other long term (current) drug therapy: Secondary | ICD-10-CM | POA: Diagnosis not present

## 2022-01-17 DIAGNOSIS — R059 Cough, unspecified: Secondary | ICD-10-CM | POA: Diagnosis not present

## 2022-01-17 DIAGNOSIS — M791 Myalgia, unspecified site: Secondary | ICD-10-CM | POA: Diagnosis not present

## 2022-01-17 NOTE — Progress Notes (Signed)
? ? ?Subjective:  ? ? Patient ID: Pamela Novak, female    DOB: [birth year], 51 y.o.   MRN: [id number] ? ?This visit occurred during the SARS-CoV-2 public health emergency.  Safety protocols were in place, including screening questions prior to the visit, additional usage of staff PPE, and extensive cleaning of exam room while observing appropriate contact time as indicated for disinfecting solutions. ? ? ? ?HPI ?Pamela Novak is here for  ?Chief Complaint  ?Patient presents with  ? Ear Pain  ?  Left ear pain  ? ? ? ? ?Left ear pain - she had bad bronchitis in Feb and was treated int he ED.  The left ear pain started in Feb with her URI.  She sneezed, blew her nose and she heard whistling and felt air come out of her ear.  It started to ache after she got water in it after showering.  Recently having wind blow into her ear caused pain.  She has occasional aching in her ear.  She has felt air  come out of her ear recently.   ? ? ?She would like a pneumonia vaccine.   ? ? ?She is worried about taking the methotrexate - after starting it she got sick and is concerned about taking it.  She also has joint pain and wonders if it is the crestor or the RA. ? ? ?Medications and allergies reviewed with patient and updated if appropriate. ? ?Current Outpatient Medications on File Prior to Visit  ?Medication Sig Dispense Refill  ? Acetaminophen (TYLENOL 8 HOUR ARTHRITIS PAIN PO)     ? albuterol (VENTOLIN HFA) 108 (90 Base) MCG/ACT inhaler Inhale 2 puffs into the lungs every 6 (six) hours as needed for wheezing or shortness of breath (Cough). 18 g 0  ? amphetamine-dextroamphetamine (ADDERALL) 10 MG tablet Take 5-10 mg by mouth 3 (three) times daily.    ? cloNIDine (CATAPRES) 0.2 MG tablet Take 0.2 mg by mouth at bedtime.    ? cyclobenzaprine (FLEXERIL) 5 MG tablet SMARTSIG:1-2 Tablet(s) By Mouth Every 8-12 Hours PRN    ? DULoxetine (CYMBALTA) 30 MG capsule Take by mouth.    ? EPINEPHrine 0.3 mg/0.3 mL IJ SOAJ injection Inject 0.3 mg  into the muscle as needed for anaphylaxis.    ? estradiol (ESTRACE) 0.1 MG/GM vaginal cream SMARTSIG:0.5 Gram(s) Vaginal 2-3 Times Weekly    ? fluconazole (DIFLUCAN) 150 MG tablet Take 1 tablet on day 4 of antibiotics.  Take second tablet 3 days later. 2 tablet 0  ? folic acid (FOLVITE) 1 MG tablet Take 1 mg by mouth daily.    ? gabapentin (NEURONTIN) 100 MG capsule 1 capsule    ? guaifenesin (HUMIBID E) 400 MG TABS tablet Take 1 tablet 3 times daily as needed for chest congestion and cough 21 tablet 0  ? hydrocortisone (ANUSOL-HC) 2.5 % rectal cream PLACE 1 APPLICATION RECTALLY 2 (TWO) TIMES DAILY. 30 g 1  ? hydrocortisone (ANUSOL-HC) 25 MG suppository PLACE 1 SUPPOSITORY (25 MG TOTAL) RECTALLY DAILY AS NEEDED FOR HEMORRHOIDS. 12 suppository 5  ? LINZESS 72 MCG capsule Take 72 mcg by mouth daily.    ? LORazepam (ATIVAN) 1 MG tablet SMARTSIG:0.5-1 Tablet(s) By Mouth 1-3 Times Daily PRN    ? meloxicam (MOBIC) 7.5 MG tablet Take 7.5 mg by mouth daily.    ? methotrexate (RHEUMATREX) 2.5 MG tablet Take 10 mg by mouth once a week.    ? ondansetron (ZOFRAN) 4 MG tablet Take 1 tablet (4 mg total)  by mouth every 8 (eight) hours as needed for nausea or vomiting. 30 tablet 0  ? ondansetron (ZOFRAN-ODT) 4 MG disintegrating tablet 1 tablet on the tongue and allow to dissolve    ? predniSONE (DELTASONE) 5 MG tablet Take by mouth.    ? promethazine-dextromethorphan (PROMETHAZINE-DM) 6.25-15 MG/5ML syrup Take 5 mLs by mouth 4 (four) times daily as needed for cough. 180 mL 0  ? rosuvastatin (CRESTOR) 5 MG tablet Take 1 tablet (5 mg total) by mouth 3 (three) times a week. 13 tablet 3  ? traZODone (DESYREL) 50 MG tablet Take 50-100 mg by mouth at bedtime.    ? triamcinolone cream (KENALOG) 0.1 % Apply 1 application topically 2 (two) times daily as needed.    ? Vitamin D, Ergocalciferol, (DRISDOL) 1.25 MG (50000 UNIT) CAPS capsule Take 50,000 Units by mouth once a week.    ? zolmitriptan (ZOMIG) 5 MG nasal solution Place 1 spray into  the nose as needed for migraine. 6 each 0  ? albuterol (ACCUNEB) 1.25 MG/3ML nebulizer solution Take 3 mLs (1.25 mg total) by nebulization every 4 (four) hours as needed for up to 15 days for wheezing or shortness of breath (cough). 270 mL 0  ? amphetamine-dextroamphetamine (ADDERALL) 30 MG tablet Take 1 tablet by mouth 2 (two) times daily. (Patient not taking: Reported on 01/18/2022)    ? ?No current facility-administered medications on file prior to visit.  ? ? ?Review of Systems  ?Constitutional:  Negative for fever.  ?HENT:  Positive for ear pain (ache - left ear). Negative for congestion, ear discharge, hearing loss, postnasal drip, sinus pain and sore throat.   ?Respiratory:  Cough: residual from bronchitis - coughs up mucus a couple of times.   ? ?   ?Objective:  ? ?Vitals:  ? 01/18/22 0815  ?BP: 110/72  ?Pulse: 79  ?Temp: 98 ?F (36.7 ?C)  ?SpO2: 98%  ? ?BP Readings from Last 3 Encounters:  ?01/18/22 110/72  ?12/27/21 104/72  ?12/03/21 126/74  ? ?Wt Readings from Last 3 Encounters:  ?01/18/22 174 lb (78.9 kg)  ?12/03/21 177 lb 12.8 oz (80.6 kg)  ?09/09/21 173 lb (78.5 kg)  ? ?Body mass index is 27.25 kg/m?. ? ?  ?Physical Exam ?Constitutional:   ?   General: She is not in acute distress. ?   Appearance: Normal appearance. She is not ill-appearing.  ?HENT:  ?   Head: Normocephalic and atraumatic.  ?   Right Ear: Tympanic membrane, ear canal and external ear normal.  ?   Left Ear: Tympanic membrane, ear canal and external ear normal.  ?   Mouth/Throat:  ?   Mouth: Mucous membranes are moist.  ?   Pharynx: No oropharyngeal exudate or posterior oropharyngeal erythema.  ?Eyes:  ?   Conjunctiva/sclera: Conjunctivae normal.  ?Cardiovascular:  ?   Rate and Rhythm: Normal rate and regular rhythm.  ?Pulmonary:  ?   Effort: Pulmonary effort is normal. No respiratory distress.  ?   Breath sounds: Normal breath sounds. No wheezing or rales.  ?Musculoskeletal:  ?   Cervical back: Neck supple. No tenderness.   ?Lymphadenopathy:  ?   Cervical: No cervical adenopathy.  ?Skin: ?   General: Skin is warm and dry.  ?Neurological:  ?   Mental Status: She is alert.  ? ?   ? ? ? ? ? ?Assessment & Plan:  ? ? ?Prevnar 20 today ? ? ?See Problem List for Assessment and Plan of chronic medical problems.  ? ? ? ? ?

## 2022-01-18 ENCOUNTER — Other Ambulatory Visit (INDEPENDENT_AMBULATORY_CARE_PROVIDER_SITE_OTHER): Payer: Federal, State, Local not specified - PPO

## 2022-01-18 ENCOUNTER — Ambulatory Visit: Payer: Federal, State, Local not specified - PPO | Admitting: Internal Medicine

## 2022-01-18 ENCOUNTER — Other Ambulatory Visit: Payer: Self-pay

## 2022-01-18 VITALS — BP 110/72 | HR 79 | Temp 98.0°F | Ht 67.0 in | Wt 174.0 lb

## 2022-01-18 DIAGNOSIS — H9202 Otalgia, left ear: Secondary | ICD-10-CM | POA: Insufficient documentation

## 2022-01-18 DIAGNOSIS — Z23 Encounter for immunization: Secondary | ICD-10-CM | POA: Diagnosis not present

## 2022-01-18 DIAGNOSIS — R7303 Prediabetes: Secondary | ICD-10-CM | POA: Diagnosis not present

## 2022-01-18 DIAGNOSIS — E7849 Other hyperlipidemia: Secondary | ICD-10-CM

## 2022-01-18 LAB — CBC WITH DIFFERENTIAL/PLATELET
Basophils Absolute: 0 10*3/uL (ref 0.0–0.1)
Basophils Relative: 0.7 % (ref 0.0–3.0)
Eosinophils Absolute: 0.1 10*3/uL (ref 0.0–0.7)
Eosinophils Relative: 3.1 % (ref 0.0–5.0)
HCT: 37.6 % (ref 36.0–46.0)
Hemoglobin: 12.7 g/dL (ref 12.0–15.0)
Lymphocytes Relative: 23.9 % (ref 12.0–46.0)
Lymphs Abs: 1.1 10*3/uL (ref 0.7–4.0)
MCHC: 33.7 g/dL (ref 30.0–36.0)
MCV: 84.4 fl (ref 78.0–100.0)
Monocytes Absolute: 0.4 10*3/uL (ref 0.1–1.0)
Monocytes Relative: 9.6 % (ref 3.0–12.0)
Neutro Abs: 2.9 10*3/uL (ref 1.4–7.7)
Neutrophils Relative %: 62.7 % (ref 43.0–77.0)
Platelets: 198 10*3/uL (ref 150.0–400.0)
RBC: 4.46 Mil/uL (ref 3.87–5.11)
RDW: 13.7 % (ref 11.5–15.5)
WBC: 4.6 10*3/uL (ref 4.0–10.5)

## 2022-01-18 LAB — COMPREHENSIVE METABOLIC PANEL
ALT: 12 U/L (ref 0–35)
AST: 16 U/L (ref 0–37)
Albumin: 4.3 g/dL (ref 3.5–5.2)
Alkaline Phosphatase: 57 U/L (ref 39–117)
BUN: 13 mg/dL (ref 6–23)
CO2: 29 mEq/L (ref 19–32)
Calcium: 9.6 mg/dL (ref 8.4–10.5)
Chloride: 105 mEq/L (ref 96–112)
Creatinine, Ser: 1.06 mg/dL (ref 0.40–1.20)
GFR: 60.46 mL/min (ref >60.00)
Glucose, Bld: 100 mg/dL — ABNORMAL HIGH (ref 70–99)
Potassium: 3.8 mEq/L (ref 3.5–5.1)
Sodium: 142 mEq/L (ref 135–145)
Total Bilirubin: 0.3 mg/dL (ref 0.2–1.2)
Total Protein: 6.4 g/dL (ref 6.0–8.3)

## 2022-01-18 LAB — HEMOGLOBIN A1C: Hgb A1c MFr Bld: 6.3 % (ref 4.6–6.5)

## 2022-01-18 LAB — LIPID PANEL
Cholesterol: 197 mg/dL (ref 0–200)
HDL: 38.1 mg/dL — ABNORMAL LOW (ref >39.00)
NonHDL: 158.58
Total CHOL/HDL Ratio: 5
Triglycerides: 236 mg/dL — ABNORMAL HIGH (ref 0.0–149.0)
VLDL: 47.2 mg/dL — ABNORMAL HIGH (ref 0.0–40.0)

## 2022-01-18 LAB — LDL CHOLESTEROL, DIRECT: Direct LDL: 114 mg/dL

## 2022-01-18 NOTE — Assessment & Plan Note (Signed)
Acute ?Ear exam including TM is normal ?? ETD - no evidence of perforation ?Advised nasonex or nasocort daily ? ?

## 2022-01-18 NOTE — Assessment & Plan Note (Signed)
Chronic ?Will hold crestor for now - ? Cause of muscle pain ?If crestor is not tolerated will look into different options ? ?

## 2022-01-18 NOTE — Patient Instructions (Addendum)
? ? ? ?  Prevnar pneumonia vaccine. ? ? ?Medications changes include :   you can try a otc steroid nasal spray - Nasonex or Nasacort daily for a week or so. ? ? ? ? ?Return if symptoms worsen or fail to improve. ? ?

## 2022-01-31 DIAGNOSIS — Z1231 Encounter for screening mammogram for malignant neoplasm of breast: Secondary | ICD-10-CM | POA: Diagnosis not present

## 2022-01-31 DIAGNOSIS — Z01419 Encounter for gynecological examination (general) (routine) without abnormal findings: Secondary | ICD-10-CM | POA: Diagnosis not present

## 2022-02-08 DIAGNOSIS — F900 Attention-deficit hyperactivity disorder, predominantly inattentive type: Secondary | ICD-10-CM | POA: Diagnosis not present

## 2022-02-08 DIAGNOSIS — F4310 Post-traumatic stress disorder, unspecified: Secondary | ICD-10-CM | POA: Diagnosis not present

## 2022-02-08 DIAGNOSIS — F411 Generalized anxiety disorder: Secondary | ICD-10-CM | POA: Diagnosis not present

## 2022-02-08 DIAGNOSIS — F332 Major depressive disorder, recurrent severe without psychotic features: Secondary | ICD-10-CM | POA: Diagnosis not present

## 2022-02-15 DIAGNOSIS — F411 Generalized anxiety disorder: Secondary | ICD-10-CM | POA: Diagnosis not present

## 2022-02-15 DIAGNOSIS — F4310 Post-traumatic stress disorder, unspecified: Secondary | ICD-10-CM | POA: Diagnosis not present

## 2022-02-15 DIAGNOSIS — F332 Major depressive disorder, recurrent severe without psychotic features: Secondary | ICD-10-CM | POA: Diagnosis not present

## 2022-02-15 DIAGNOSIS — F900 Attention-deficit hyperactivity disorder, predominantly inattentive type: Secondary | ICD-10-CM | POA: Diagnosis not present

## 2022-03-02 DIAGNOSIS — K219 Gastro-esophageal reflux disease without esophagitis: Secondary | ICD-10-CM | POA: Diagnosis not present

## 2022-03-02 DIAGNOSIS — J383 Other diseases of vocal cords: Secondary | ICD-10-CM | POA: Diagnosis not present

## 2022-03-02 DIAGNOSIS — J4520 Mild intermittent asthma, uncomplicated: Secondary | ICD-10-CM | POA: Diagnosis not present

## 2022-03-02 DIAGNOSIS — L509 Urticaria, unspecified: Secondary | ICD-10-CM | POA: Diagnosis not present

## 2022-03-02 DIAGNOSIS — J3089 Other allergic rhinitis: Secondary | ICD-10-CM | POA: Diagnosis not present

## 2022-03-03 DIAGNOSIS — M791 Myalgia, unspecified site: Secondary | ICD-10-CM | POA: Diagnosis not present

## 2022-03-03 DIAGNOSIS — M1990 Unspecified osteoarthritis, unspecified site: Secondary | ICD-10-CM | POA: Diagnosis not present

## 2022-03-03 DIAGNOSIS — Z79899 Other long term (current) drug therapy: Secondary | ICD-10-CM | POA: Diagnosis not present

## 2022-03-03 DIAGNOSIS — E559 Vitamin D deficiency, unspecified: Secondary | ICD-10-CM | POA: Diagnosis not present

## 2022-03-17 DIAGNOSIS — F4310 Post-traumatic stress disorder, unspecified: Secondary | ICD-10-CM | POA: Diagnosis not present

## 2022-03-17 DIAGNOSIS — F900 Attention-deficit hyperactivity disorder, predominantly inattentive type: Secondary | ICD-10-CM | POA: Diagnosis not present

## 2022-03-17 DIAGNOSIS — F332 Major depressive disorder, recurrent severe without psychotic features: Secondary | ICD-10-CM | POA: Diagnosis not present

## 2022-03-17 DIAGNOSIS — F411 Generalized anxiety disorder: Secondary | ICD-10-CM | POA: Diagnosis not present

## 2022-03-24 DIAGNOSIS — F411 Generalized anxiety disorder: Secondary | ICD-10-CM | POA: Diagnosis not present

## 2022-03-24 DIAGNOSIS — F900 Attention-deficit hyperactivity disorder, predominantly inattentive type: Secondary | ICD-10-CM | POA: Diagnosis not present

## 2022-03-24 DIAGNOSIS — F4310 Post-traumatic stress disorder, unspecified: Secondary | ICD-10-CM | POA: Diagnosis not present

## 2022-03-24 DIAGNOSIS — F332 Major depressive disorder, recurrent severe without psychotic features: Secondary | ICD-10-CM | POA: Diagnosis not present

## 2022-04-02 ENCOUNTER — Ambulatory Visit
Admission: RE | Admit: 2022-04-02 | Discharge: 2022-04-02 | Disposition: A | Discharge status: Home / Self Care | Payer: Federal, State, Local not specified - PPO | Source: Ambulatory Visit

## 2022-04-02 VITALS — BP 134/82 | HR 107 | Temp 98.3°F | Resp 16

## 2022-04-02 DIAGNOSIS — G518 Other disorders of facial nerve: Secondary | ICD-10-CM | POA: Diagnosis not present

## 2022-04-02 MED ORDER — BACLOFEN 10 MG PO TABS: 10.0000 mg | ORAL_TABLET | Freq: Three times a day (TID) | ORAL | 0 refills | Status: AC

## 2022-04-02 MED ORDER — ACETAMINOPHEN 500 MG PO TABS: 1000.0000 mg | ORAL_TABLET | Freq: Three times a day (TID) | ORAL | 0 refills | Status: AC

## 2022-04-02 MED ORDER — GABAPENTIN 400 MG PO CAPS: 800.0000 mg | ORAL_CAPSULE | Freq: Three times a day (TID) | ORAL | 0 refills | Status: DC

## 2022-04-02 MED ORDER — DEXAMETHASONE SODIUM PHOSPHATE 10 MG/ML IJ SOLN
20.0000 mg | Freq: Once | INTRAMUSCULAR | Status: AC
  Administered 2022-04-02: 20 mg via INTRAMUSCULAR

## 2022-04-02 NOTE — ED Triage Notes (Signed)
Pt c/o left sided facial pain that starts in her temple and ear and radiates behind her eye to the teeth on her left side. She states nothing was abnormal from x-rays at dental office. She reports she does have a referral to a specialist for TMJ.   Started: 03/29/22  Home interventions: Tylenol, flexeril, gabapentin, mucinex DM- not helpful

## 2022-04-02 NOTE — ED Provider Notes (Signed)
[facility]    CSN: [id number] Arrival date & time: 04/02/22  0900    HISTORY   Chief Complaint  Patient presents with   Facial Pain    Left side radiating pain:1. between temple & ear2. behind left eye3. in left cheekbone4. toothache-like pain in teeth/4 top+4 bottomReferred June 1 by Dentist Barth Kirks to [facility].Request help w/pain management until appt. - Entered by patient   HPI Pamela Novak is a 51 y.o. female.   Pt c/o left sided facial pain that originates at her left temple just above her tragus, radiates behind her left eye, radiates down to her teeth left, radiates to the left side of her her jaw and also radiates down the left side of her neck.  Patient states this began 2 days ago.  Patient states she is unable to chew anything at this time secondary to pain.  Patient demonstrates full range of motion of her jaw by opening her mouth widely.  Patient states she was recently seen by her dentist and her dental x-rays were normal.  Patient states she was referred to a TMJ specialist but does not know when that appointment will be.  Patient states she has been provided with dental appliances to prevent her from grinding her teeth at night, states her most recent appliance cracked after a few months of use.  Patient states she used to have a dental appliance she wear the daytime as well that fit across her bottom teeth and had an extra bulk of material added to it which prevented her from clenching her teeth.    Patient states she has multiple medications for pain at home due to a history of rheumatoid arthritis.  Patient states she has separately taken Tylenol, Flexeril, gabapentin 400 mg and Mucinex DM with no relief.  Patient states she took them separately so that she could specifically know which one helped the most but unfortunately none have.  Of note, patient states she has had a similar episode in the past, states hydrocodone 10 mg is the only  medication that worked at to relieve her pain.  PDMP reviewed, patient has also had prescriptions for Adderall, Vyvanse, oxycodone, lorazepam and Xanax over the past year.  Also of note, she has not had a prescription for hydrocodone for the past 2 years.  Patient has a mildly elevated heart rate on arrival with otherwise normal vital signs.  Patient appears to be irritable but is otherwise in no acute distress.  Patient states that she recently had a rheumatoid arthritis flare and her provider put her on a prednisone taper but after 3 days, her psychiatrist advised her to discontinue it because it was affecting her emotionally.         Past Medical History:  Diagnosis Date   ADHD    Allergy    Anxiety    severe   Arthritis    Bruxism (teeth grinding) 06/23/2021   WEARS  NIGHT GUARD WILL BRING DOS   Complication of anesthesia    "woke up during colonoscopy" age 67 years old   Constipation    Depression    Diverticulosis    Folliculitis 99/24/2683   ACNE PER PT   GERD (gastroesophageal reflux disease)    occ hiccup   Hemorrhoids    History of colon polyps    Hyperlipidemia    IBS (irritable bowel syndrome)    Migraines    Pre-diabetes    PTSD (post-traumatic stress disorder)  severe per psychiatry note dr Pearson Grippe 06-18-2021   Restless legs syndrome (RLS)    Sciatica    Right side   Varicose veins of lower extremity    in vagina also and both legs   Wears glasses 06/23/2021   Patient Active Problem List   Diagnosis Date Noted   Left ear pain 01/18/2022   ADHD 12/03/2021   Prolapse of female pelvic organs 06/29/2021   S/P laparoscopic assisted vaginal hysterectomy (LAVH) 06/29/2021   S/P Nissen fundoplication (without gastrostomy tube) procedure 09/05/2019   Chronic constipation 11/16/2018   Neck pain 09/21/2018   Migraine without aura and with status migrainosus, not intractable 04/12/2018   Bilateral low back pain 03/21/2018   Lumbar disc herniation with  chronic lower back pain 03/21/2018   Prediabetes 03/23/2017   Seborrheic dermatitis of scalp 03/23/2017   Hyperlipidemia 01/21/2016   Depression 01/18/2016   PTSD (post-traumatic stress disorder) 01/18/2016   GERD (gastroesophageal reflux disease) 01/18/2016   Cough 11/11/2015   Varicose veins 05/21/2013   GAD (generalized anxiety disorder) 11/02/2012   Past Surgical History:  Procedure Laterality Date   24 HOUR [place] STUDY N/A 04/01/2019   Procedure: 24 HOUR PH STUDY;  Surgeon: Mauri Pole, MD;  Location: [facility];  Service: Endoscopy;  Laterality: N/A;   ABLATION ON ENDOMETRIOSIS  10/04/2012   Procedure: ABLATION ON ENDOMETRIOSIS;  Surgeon: Linda Hedges, DO;  Location: [facility];  Service: Gynecology;  Laterality: N/A;   ANTERIOR AND POSTERIOR REPAIR WITH SACROSPINOUS FIXATION N/A 06/29/2021   Procedure: ANTERIOR AND POSTERIOR REPAIR;  Surgeon: Linda Hedges, DO;  Location: [place];  Service: Gynecology;  Laterality: N/A;   bravo test  03/11/2021   COLONOSCOPY  2019   [id number]   CONDYLOMA EXCISION/FULGURATION  10/04/2012   Procedure: CONDYLOMA REMOVAL;  Surgeon: MLinda Hedges DO;  [facility];  Service: Gynecology;  Laterality: N/A;   CYSTOSCOPY N/A 06/29/2021   Procedure: CYSTOSCOPY;  Surgeon: MLinda Hedges DO;  [facility]  Service: Gynecology;  Laterality: N/A;   DILITATION & CURRETTAGE/HYSTROSCOPY WITH NOVASURE ABLATION  10/04/2012   Procedure: DILATATION & CURETTAGE/HYSTEROSCOPY WITH NOVASURE ABLATION;  Surgeon: MLinda Hedges DO;  [facility];  Service: Gynecology;  Laterality: N/A;   ESOPHAGEAL MANOMETRY N/A 04/01/2019   Procedure: ESOPHAGEAL MANOMETRY (EM);  Surgeon: NMauri Pole MD;  [facility];  Service: Endoscopy;  Laterality: N/A;   HEMORRHOID BANDING     multiple times yrs ago   LAPAROSCOPIC NISSEN FUNDOPLICATION N/A 105/39/7673  Procedure: LAPAROSCOPIC HIATAL HERNIA REPAIR AND NISSEN  FUNDOPLICATION;  Surgeon: WGreer Pickerel MD;  [facility];  Service: General;  Laterality: N/A;   LAPAROSCOPIC VAGINAL HYSTERECTOMY WITH SALPINGECTOMY Bilateral 06/29/2021   Procedure: LAPAROSCOPIC ASSISTED VAGINAL HYSTERECTOMY WITH BIILATERAL SALPINGECTOMY; CAUTERY OF VULVAR HEMANGIOMAS AND EXCISION OF LESION AT INTROITUS;  Surgeon: MLinda Hedges DO;  [place] [facility]  Service: Gynecology;  Laterality: Bilateral;   LAPAROSCOPY  10/04/2012   Procedure: LAPAROSCOPY OPERATIVE;  Surgeon: MLinda Hedges DO;  [facility];  Service: Gynecology;  Laterality: N/A;   MASS EXCISION Bilateral 09/05/2019   Procedure: EXCISION BILATERAL SOFT TISSUE MASSES, THIGH;  Surgeon: WGreer Pickerel MD;  [facility];  Service: General;  Laterality: Bilateral;   RADIOFREQUENCY ABLATION NERVES     sciatic nerve last 3 to 4 vertebrate lower and spine and sciatic nerve both legs   UPPER GI ENDOSCOPY  04/01/2019   OB History   No obstetric history  on file.    Home Medications    Prior to Admission medications   Medication Sig Start Date End Date Taking? Authorizing Provider  acetaminophen (TYLENOL) 500 MG tablet Take 2 tablets (1,000 mg total) by mouth every 8 (eight) hours. 04/02/22 05/02/22 Yes Lynden Oxford Scales, PA-C  baclofen (LIORESAL) 10 MG tablet Take 1 tablet (10 mg total) by mouth 3 (three) times daily for 7 days. 04/02/22 04/09/22 Yes Lynden Oxford Scales, PA-C  gabapentin (NEURONTIN) 400 MG capsule Take 2 capsules (800 mg total) by mouth 3 (three) times daily for 14 days. 04/02/22 04/16/22 Yes Lynden Oxford Scales, PA-C  albuterol (ACCUNEB) 1.25 MG/3ML nebulizer solution Take 3 mLs (1.25 mg total) by nebulization every 4 (four) hours as needed for up to 15 days for wheezing or shortness of breath (cough). 12/27/21 01/11/22  Lynden Oxford Scales, PA-C  albuterol (VENTOLIN HFA) 108 (90 Base) MCG/ACT inhaler Inhale 2 puffs into the lungs every 6 (six) hours as needed for wheezing or  shortness of breath (Cough). 12/27/21   Lynden Oxford Scales, PA-C  amphetamine-dextroamphetamine (ADDERALL) 10 MG tablet Take 5-10 mg by mouth 3 (three) times daily. 12/27/21   [provider]  cloNIDine (CATAPRES) 0.2 MG tablet Take 0.2 mg by mouth at bedtime. 11/06/21   [provider]  DULoxetine (CYMBALTA) 30 MG capsule Take by mouth. 12/24/21   [provider]  EPINEPHrine 0.3 mg/0.3 mL IJ SOAJ injection Inject 0.3 mg into the muscle as needed for anaphylaxis.    [provider]  estradiol (ESTRACE) 0.1 MG/GM vaginal cream SMARTSIG:0.5 Gram(s) Vaginal 2-3 Times Weekly 10/27/21   [provider]  folic acid (FOLVITE) 1 MG tablet Take 1 mg by mouth daily. 01/15/22   [provider]  gabapentin (NEURONTIN) 100 MG capsule 1 capsule    [provider]  leflunomide (ARAVA) 20 MG tablet Take 20 mg by mouth daily. 03/30/22   [provider]  LINZESS 72 MCG capsule Take 72 mcg by mouth daily. 08/25/21   [provider]  meloxicam (MOBIC) 7.5 MG tablet Take 7.5 mg by mouth daily. 09/01/21   [provider]  rosuvastatin (CRESTOR) 5 MG tablet Take 1 tablet (5 mg total) by mouth 3 (three) times a week. 12/03/21   [name], MD  traZODone (DESYREL) 50 MG tablet Take 50-100 mg by mouth at bedtime. 11/23/21   [provider]  triamcinolone cream (KENALOG) 0.1 % Apply 1 application topically 2 (two) times daily as needed. 11/21/21   [provider]  Vitamin D, Ergocalciferol, (DRISDOL) 1.25 MG (50000 UNIT) CAPS capsule Take 50,000 Units by mouth once a week. 11/24/21   [provider]  zolmitriptan (ZOMIG) 5 MG nasal solution Place 1 spray into the nose as needed for migraine. 05/31/21   [name], MD    Family History Family History  Problem Relation Age of Onset   Heart disease Mother    Arthritis Mother    Hyperlipidemia Mother    Hypertension Mother    Heart attack Mother    Colon  polyps Father    Hashimoto's thyroiditis Sister    Diabetes Sister    Thyroid disease Sister    Colon cancer Paternal Grandfather    Thyroid disease Sister    Social History Social History   Tobacco Use   Smoking status: Never   Smokeless tobacco: Never  Vaping Use   Vaping Use: Never used  Substance Use Topics   Alcohol use: Not Currently  Drug use: No   Allergies   Capsaicin, Black pepper-turmeric, Nsaids, Other, Atorvastatin, and Flagyl [metronidazole]  Review of Systems Review of Systems Pertinent findings noted in history of present illness.   Physical Exam Triage Vital Signs ED Triage Vitals  Enc Vitals Group     BP 08/27/21 0827 (!) 147/82     Pulse Rate 08/27/21 0827 72     Resp 08/27/21 0827 18     Temp 08/27/21 0827 98.3 F (36.8 C)     Temp Source 08/27/21 0827 Oral     SpO2 08/27/21 0827 98 %     Weight --      Height --      Head Circumference --      Peak Flow --      Pain Score 08/27/21 0826 5     Pain Loc --      Pain Edu? --      Excl. in [place]? --   No data found.  Updated Vital Signs BP 134/82 (BP Location: Right Arm)   Pulse (!) 107   Temp 98.3 F (36.8 C) (Oral)   Resp 16   LMP 01/19/2020   SpO2 98%   Physical Exam Vitals and nursing note reviewed.  Constitutional:      General: She is not in acute distress.    Appearance: Normal appearance. She is not ill-appearing.  HENT:     Head: Normocephalic and atraumatic. No raccoon eyes, Battle's sign, abrasion, masses, right periorbital erythema, left periorbital erythema or laceration. Hair is normal.     Jaw: There is normal jaw occlusion. Tenderness and pain on movement present. No trismus, swelling or malocclusion.     Salivary Glands: Right salivary gland is not diffusely enlarged or tender. Left salivary gland is not diffusely enlarged or tender.     Right Ear: Tympanic membrane, ear canal and external ear normal. No drainage. No middle ear effusion. There is no impacted cerumen.  Tympanic membrane is not erythematous or bulging.     Left Ear: Tympanic membrane, ear canal and external ear normal. No drainage.  No middle ear effusion. There is no impacted cerumen. Tympanic membrane is not erythematous or bulging.     Nose: Nose normal. No nasal deformity, septal deviation, mucosal edema, congestion or rhinorrhea.     Right Turbinates: Not enlarged, swollen or pale.     Left Turbinates: Not enlarged, swollen or pale.     Right Sinus: No maxillary sinus tenderness or frontal sinus tenderness.     Left Sinus: No maxillary sinus tenderness or frontal sinus tenderness.     Mouth/Throat:     Lips: Pink. No lesions.     Mouth: Mucous membranes are moist. No oral lesions.     Pharynx: Oropharynx is clear. Uvula midline. No posterior oropharyngeal erythema or uvula swelling.     Tonsils: No tonsillar exudate. 0 on the right. 0 on the left.  Eyes:     General: Lids are normal.        Right eye: No discharge.        Left eye: No discharge.     Extraocular Movements: Extraocular movements intact.     Conjunctiva/sclera: Conjunctivae normal.     Right eye: Right conjunctiva is not injected.     Left eye: Left conjunctiva is not injected.     Pupils: Pupils are equal, round, and reactive to light.  Neck:     Thyroid: No thyroid mass, thyromegaly or thyroid tenderness.  Vascular: Normal carotid pulses. No carotid bruit, hepatojugular reflux or JVD.     Trachea: Trachea and phonation normal. No tracheal tenderness.  Cardiovascular:     Rate and Rhythm: Normal rate and regular rhythm.     Pulses: Normal pulses.     Heart sounds: Normal heart sounds. No murmur heard.   No friction rub. No gallop.  Pulmonary:     Effort: Pulmonary effort is normal. No accessory muscle usage, prolonged expiration or respiratory distress.     Breath sounds: Normal breath sounds. No stridor, decreased air movement or transmitted upper airway sounds. No decreased breath sounds, wheezing, rhonchi or  rales.  Chest:     Chest wall: No tenderness.  Musculoskeletal:        General: Normal range of motion.     Cervical back: Full passive range of motion without pain, normal range of motion and neck supple. No edema, erythema, signs of trauma, rigidity, torticollis or crepitus. No pain with movement, spinous process tenderness or muscular tenderness. Normal range of motion.  Lymphadenopathy:     Head:     Right side of head: No submental, submandibular, tonsillar, preauricular, posterior auricular or occipital adenopathy.     Left side of head: No submental, submandibular, tonsillar, preauricular, posterior auricular or occipital adenopathy.     Cervical: No cervical adenopathy.     Right cervical: No superficial, deep or posterior cervical adenopathy.    Left cervical: No superficial, deep or posterior cervical adenopathy.     Upper Body:     Right upper body: No supraclavicular adenopathy.     Left upper body: No supraclavicular adenopathy.  Skin:    General: Skin is warm and dry.     Findings: No erythema or rash.  Neurological:     General: No focal deficit present.     Mental Status: She is alert and oriented to person, place, and time.     Cranial Nerves: Cranial nerves 2-12 are intact.     Motor: Motor function is intact.     Coordination: Coordination is intact.     Deep Tendon Reflexes: Reflexes are normal and symmetric.  Psychiatric:        Attention and Perception: Attention and perception normal.        Mood and Affect: Mood and affect normal.        Speech: Speech normal.        Behavior: Behavior normal. Behavior is cooperative.        Thought Content: Thought content normal.        Cognition and Memory: Cognition and memory normal.        Judgment: Judgment normal.    Visual Acuity Right Eye Distance:   Left Eye Distance:   Bilateral Distance:    Right Eye Near:   Left Eye Near:    Bilateral Near:     [facility] Couse / Diagnostics / Procedures:     EKG  Radiology No results found.  Procedures Procedures (including critical care time)  UC Diagnoses / Final Clinical Impressions(s)   I have reviewed the triage vital signs and the nursing notes.  Pertinent labs & imaging results that were available during my care of the patient were reviewed by me and considered in my medical decision making (see chart for details).    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  Patient declined offer for glucocorticoid p.o., was agreeable  to dexamethasone injection.  Patient denies visual changes but endorses polymyalgia and overall feeling unwell.  Patient provided with information regarding TMJ as well as temporal arteritis.  Patient advised that if she does not have improvement of her pain in the next 3 to 4 hours or has initial improvement or pain returns, recommend that she go to the emergency room for further evaluation.  Patient was also provided with prescription for baclofen, Tylenol and gabapentin and that she should continue Mobic as prescribed for pain relief while she is waiting to be seen by the TMJ specialist.  Patient advised that I do not prescribe controlled substances at this clinic but perhaps she could reach out to her primary care provider to see if they would be willing to prescribe this for her if my recommendations are ineffective.  ED Prescriptions     Medication Sig Dispense Auth. Provider   baclofen (LIORESAL) 10 MG tablet Take 1 tablet (10 mg total) by mouth 3 (three) times daily for 7 days. 21 tablet Lynden Oxford Scales, PA-C   acetaminophen (TYLENOL) 500 MG tablet Take 2 tablets (1,000 mg total) by mouth every 8 (eight) hours. 180 tablet Lynden Oxford Scales, PA-C   gabapentin (NEURONTIN) 400 MG capsule Take 2 capsules (800 mg total) by mouth 3 (three) times daily for 14 days. 84 capsule Lynden Oxford Scales, [place]      PDMP not reviewed  this encounter.  Pending results:  Labs Reviewed - No data to display  Medications Ordered in UC: Medications  dexamethasone (DECADRON) injection 20 mg (has no administration in time range)    Disposition Upon Discharge:  Condition: stable for discharge home Home: take medications as prescribed; routine discharge instructions as discussed; follow up as advised.  Patient presented with an acute illness with associated systemic symptoms and significant discomfort requiring urgent management. In my opinion, this is a condition that a prudent lay person (someone who possesses an average knowledge of health and medicine) may potentially expect to result in complications if not addressed urgently such as respiratory distress, impairment of bodily function or dysfunction of bodily organs.   Routine symptom specific, illness specific and/or disease specific instructions were discussed with the patient and/or caregiver at length.   As such, the patient has been evaluated and assessed, work-up was performed and treatment was provided in alignment with urgent care protocols and evidence based medicine.  Patient/parent/caregiver has been advised that the patient may require follow up for further testing and treatment if the symptoms continue in spite of treatment, as clinically indicated and appropriate.  Patient/parent/caregiver has been advised to return to the [facility] or PCP if no better; to PCP or the Emergency Department if new signs and symptoms develop, or if the current signs or symptoms continue to change or worsen for further workup, evaluation and treatment as clinically indicated and appropriate  The patient will follow up with their current PCP if and as advised. If the patient does not currently have a PCP we will assist them in obtaining one.   The patient may need specialty follow up if the symptoms continue, in spite of conservative treatment and management, for further workup, evaluation,  consultation and treatment as clinically indicated and appropriate.   Patient/parent/caregiver verbalized understanding and agreement of plan as discussed.  All questions were addressed during visit.  Please see discharge instructions below for further details of plan.  Discharge Instructions:   Discharge Instructions      I have enclosed information about both  TMJ and temporal arteritis for you so that you could compare the 2 syndromes, see which one might sound more like what you are experiencing right now.  You received an injection of dexamethasone during your visit today.  I would anticipate that this would significantly reduce your inflammatory pain within the next hour or so.  If you do not have relief of your symptoms or your pain returns within 2 to 3 hours after this injection, I think is important that you go to the emergency room for them to rule out temporal arteritis.  If you do have relief of your symptoms, please begin taking baclofen 3 times a day which is safe to take with your leflunomide.  Would also like for you to begin taking Tylenol 1 g 3 times daily, gabapentin 400 to 800 mg 3 times daily as well.  I think when you combine all 3 of these medications together you should have significant relief of your symptoms if this is TMJ.  I have sent prescriptions for all 3 of these to your pharmacy.  Thank you for visiting urgent care today.  I appreciate the opportunity to participate in your care.      This office note has been dictated using Museum/gallery curator.  Unfortunately, and despite my best efforts, this method of dictation can sometimes lead to occasional typographical or grammatical errors.  I apologize in advance if this occurs.     Lynden Oxford Scales, PA-C 04/02/22 1144

## 2022-04-02 NOTE — Discharge Instructions (Addendum)
I have enclosed information about both TMJ and temporal arteritis for you so that you could compare the 2 syndromes, see which one might sound more like what you are experiencing right now.  You received an injection of dexamethasone during your visit today.  I would anticipate that this would significantly reduce your inflammatory pain within the next hour or so.  If you do not have relief of your symptoms or your pain returns within 2 to 3 hours after this injection, I think is important that you go to the emergency room for them to rule out temporal arteritis.  If you do have relief of your symptoms, please begin taking baclofen 3 times a day which is safe to take with your leflunomide.  Would also like for you to begin taking Tylenol 1 g 3 times daily, gabapentin 400 to 800 mg 3 times daily as well.  I think when you combine all 3 of these medications together you should have significant relief of your symptoms if this is TMJ.  I have sent prescriptions for all 3 of these to your pharmacy.  Thank you for visiting urgent care today.  I appreciate the opportunity to participate in your care.

## 2022-04-04 DIAGNOSIS — M791 Myalgia, unspecified site: Secondary | ICD-10-CM | POA: Diagnosis not present

## 2022-04-04 DIAGNOSIS — Z79899 Other long term (current) drug therapy: Secondary | ICD-10-CM | POA: Diagnosis not present

## 2022-04-04 DIAGNOSIS — M1990 Unspecified osteoarthritis, unspecified site: Secondary | ICD-10-CM | POA: Diagnosis not present

## 2022-04-04 DIAGNOSIS — E559 Vitamin D deficiency, unspecified: Secondary | ICD-10-CM | POA: Diagnosis not present

## 2022-04-06 DIAGNOSIS — M792 Neuralgia and neuritis, unspecified: Secondary | ICD-10-CM | POA: Diagnosis not present

## 2022-04-06 DIAGNOSIS — M05771 Rheumatoid arthritis with rheumatoid factor of right ankle and foot without organ or systems involvement: Secondary | ICD-10-CM | POA: Diagnosis not present

## 2022-04-07 DIAGNOSIS — R519 Headache, unspecified: Secondary | ICD-10-CM | POA: Diagnosis not present

## 2022-04-15 ENCOUNTER — Other Ambulatory Visit: Payer: Self-pay | Admitting: Otolaryngology

## 2022-04-15 DIAGNOSIS — R519 Headache, unspecified: Secondary | ICD-10-CM | POA: Diagnosis not present

## 2022-04-15 DIAGNOSIS — M316 Other giant cell arteritis: Secondary | ICD-10-CM | POA: Diagnosis not present

## 2022-04-16 ENCOUNTER — Encounter: Payer: Self-pay | Admitting: Gastroenterology

## 2022-04-20 DIAGNOSIS — M7711 Lateral epicondylitis, right elbow: Secondary | ICD-10-CM | POA: Diagnosis not present

## 2022-04-20 DIAGNOSIS — M25521 Pain in right elbow: Secondary | ICD-10-CM | POA: Diagnosis not present

## 2022-04-24 ENCOUNTER — Other Ambulatory Visit: Payer: Self-pay | Admitting: Internal Medicine

## 2022-04-25 DIAGNOSIS — M25521 Pain in right elbow: Secondary | ICD-10-CM | POA: Diagnosis not present

## 2022-04-28 DIAGNOSIS — F900 Attention-deficit hyperactivity disorder, predominantly inattentive type: Secondary | ICD-10-CM | POA: Diagnosis not present

## 2022-04-28 DIAGNOSIS — F411 Generalized anxiety disorder: Secondary | ICD-10-CM | POA: Diagnosis not present

## 2022-04-28 DIAGNOSIS — F332 Major depressive disorder, recurrent severe without psychotic features: Secondary | ICD-10-CM | POA: Diagnosis not present

## 2022-04-28 DIAGNOSIS — F4310 Post-traumatic stress disorder, unspecified: Secondary | ICD-10-CM | POA: Diagnosis not present

## 2022-04-29 DIAGNOSIS — K080 Exfoliation of teeth due to systemic causes: Secondary | ICD-10-CM | POA: Diagnosis not present

## 2022-05-05 ENCOUNTER — Ambulatory Visit: Payer: Federal, State, Local not specified - PPO | Admitting: Family Medicine

## 2022-05-05 ENCOUNTER — Ambulatory Visit: Payer: Federal, State, Local not specified - PPO

## 2022-05-05 ENCOUNTER — Encounter: Payer: Self-pay | Admitting: Family Medicine

## 2022-05-05 VITALS — BP 124/62 | HR 116 | Temp 97.8°F | Ht 67.0 in | Wt 162.0 lb

## 2022-05-05 DIAGNOSIS — F411 Generalized anxiety disorder: Secondary | ICD-10-CM

## 2022-05-05 DIAGNOSIS — G43001 Migraine without aura, not intractable, with status migrainosus: Secondary | ICD-10-CM

## 2022-05-05 DIAGNOSIS — F909 Attention-deficit hyperactivity disorder, unspecified type: Secondary | ICD-10-CM | POA: Diagnosis not present

## 2022-05-05 DIAGNOSIS — Z9889 Other specified postprocedural states: Secondary | ICD-10-CM | POA: Diagnosis not present

## 2022-05-05 DIAGNOSIS — R519 Headache, unspecified: Secondary | ICD-10-CM

## 2022-05-05 NOTE — Progress Notes (Signed)
Subjective:     Patient ID: Pamela Novak, female    DOB: [birth year], 51 y.o.   MRN: [id number]  Chief Complaint  Patient presents with   Migraine    Starting to have 3-4 migraines a week when she used to have 1-2 a month. Would like to discuss short term migraine preventive medication.     HPI Patient is in today for increased frequency of headaches that are not her typical migraines.  Denies headache today.   Normally has 1-2 migraines per month. Repots having 3-4 headaches per week. Has been on Amoxil and Tylenol for multiple dental conditions. Tylenol was not helping.   Cannot take NSAIDs usually but she was "given permission" to take meloxicam yesterday.   Started Sudafed yesterday. She has not taken her ADHD medications this morning.   Feeling anxious. Tearful at times during the visit.   Seeing TMJ specialist and having dental procedures. States she also had procedure for ?temporal arteritis but her issue ended up being related to oral abscess. Bone graft last week.   Denies fever, chills, dizziness, chest pain, palpitations, shortness of breath, abdominal pain, N/V/D, urinary symptoms, LE edema.   Reports having a lot of medical issues recently and wanted to share these with me.  Diagnosed with RA but is not currently taking treatment due to surgery.    Recent hysterectomy.    Health Maintenance Due  Topic Date Due   Zoster Vaccines- Shingrix (2 of 2) 07/21/2020    Past Medical History:  Diagnosis Date   ADHD    Allergy    Anxiety    severe   Arthritis    Bruxism (teeth grinding) 06/23/2021   WEARS  NIGHT GUARD WILL BRING DOS   Complication of anesthesia    "woke up during colonoscopy" age 79 years old   Constipation    Depression    Diverticulosis    Folliculitis 57/84/6962   ACNE PER PT   GERD (gastroesophageal reflux disease)    occ hiccup   Hemorrhoids    History of colon polyps    Hyperlipidemia    IBS (irritable bowel syndrome)     Migraines    Pre-diabetes    PTSD (post-traumatic stress disorder)    severe per psychiatry note dr Pearson Grippe 06-18-2021   Restless legs syndrome (RLS)    Sciatica    Right side   Varicose veins of lower extremity    in vagina also and both legs   Wears glasses 06/23/2021    Past Surgical History:  Procedure Laterality Date   24 HOUR [place] STUDY N/A 04/01/2019   Procedure: 24 HOUR PH STUDY;  Surgeon: Mauri Pole, MD;  Location: [facility];  Service: Endoscopy;  Laterality: N/A;   ABLATION ON ENDOMETRIOSIS  10/04/2012   Procedure: ABLATION ON ENDOMETRIOSIS;  Surgeon: Linda Hedges, DO;  Location: [facility];  Service: Gynecology;  Laterality: N/A;   ANTERIOR AND POSTERIOR REPAIR WITH SACROSPINOUS FIXATION N/A 06/29/2021   Procedure: ANTERIOR AND POSTERIOR REPAIR;  Surgeon: Linda Hedges, DO;  Location: [facility];  Service: Gynecology;  Laterality: N/A;   bravo test  03/11/2021   COLONOSCOPY  2019   [id number]   CONDYLOMA EXCISION/FULGURATION  10/04/2012   Procedure: CONDYLOMA REMOVAL;  Surgeon: MLinda Hedges DO;  [facility];  Service: Gynecology;  Laterality: N/A;   CYSTOSCOPY N/A 06/29/2021   Procedure: CYSTOSCOPY;  Surgeon: MLinda Hedges DO;  Location[place] [facility]  Service: Gynecology;  Laterality:  N/A;   DILITATION & CURRETTAGE/HYSTROSCOPY WITH NOVASURE ABLATION  10/04/2012   Procedure: DILATATION & CURETTAGE/HYSTEROSCOPY WITH NOVASURE ABLATION;  Surgeon: Linda Hedges, DO;  Location: [facility];  Service: Gynecology;  Laterality: N/A;   ESOPHAGEAL MANOMETRY N/A 04/01/2019   Procedure: ESOPHAGEAL MANOMETRY (EM);  Surgeon: Mauri Pole, MD;  Location: [facility];  Service: Endoscopy;  Laterality: N/A;   HEMORRHOID BANDING     multiple times yrs ago   LAPAROSCOPIC NISSEN FUNDOPLICATION N/A 86/76/7209   Procedure: LAPAROSCOPIC HIATAL HERNIA REPAIR AND NISSEN FUNDOPLICATION;  Surgeon: [name], MD;  Location: [facility];  Service: General;   Laterality: N/A;   LAPAROSCOPIC VAGINAL HYSTERECTOMY WITH SALPINGECTOMY Bilateral 06/29/2021   Procedure: LAPAROSCOPIC ASSISTED VAGINAL HYSTERECTOMY WITH BIILATERAL SALPINGECTOMY; CAUTERY OF VULVAR HEMANGIOMAS AND EXCISION OF LESION AT INTROITUS;  Surgeon: Linda Hedges, DO;  Location: [facility];  Service: Gynecology;  Laterality: Bilateral;   LAPAROSCOPY  10/04/2012   Procedure: LAPAROSCOPY OPERATIVE;  Surgeon: Linda Hedges, DO;  Location: [facility];  Service: Gynecology;  Laterality: N/A;   MASS EXCISION Bilateral 09/05/2019   Procedure: EXCISION BILATERAL SOFT TISSUE MASSES, THIGH;  Surgeon: [name], MD;  Location: [facility];  Service: General;  Laterality: Bilateral;   RADIOFREQUENCY ABLATION NERVES     sciatic nerve last 3 to 4 vertebrate lower and spine and sciatic nerve both legs   UPPER GI ENDOSCOPY  04/01/2019    Family History  Problem Relation Age of Onset   Heart disease Mother    Arthritis Mother    Hyperlipidemia Mother    Hypertension Mother    Heart attack Mother    Colon polyps Father    Hashimoto's thyroiditis Sister    Diabetes Sister    Thyroid disease Sister    Colon cancer Paternal Grandfather    Thyroid disease Sister     Social History   Socioeconomic History   Marital status: Married    Spouse name: [name]   Number of children: 2   Years of education: 12+   Highest education level: Not on file  Occupational History   Occupation: adm Radiation protection practitioner: [employer]  Tobacco Use   Smoking status: Never   Smokeless tobacco: Never  Vaping Use   Vaping Use: Never used  Substance and Sexual Activity   Alcohol use: Not Currently   Drug use: No   Sexual activity: Yes  Other Topics Concern   Not on file  Social History Narrative   Regular exercise-no   Caffeine Use-yes   Social Determinants of Health   Financial Resource Strain: Not on file  Food Insecurity: Not on file  Transportation Needs: Not on file  Physical  Activity: Not on file  Stress: Not on file  Social Connections: Not on file  Intimate Partner Violence: Not on file    Outpatient Medications Prior to Visit  Medication Sig Dispense Refill   Acetaminophen 500 MG capsule      amphetamine-dextroamphetamine (ADDERALL) 20 MG tablet TAKE 1 TABLET BY MOUTH EVERY AM AND ONE TABLET IN AFTERNOON TO EXTEND EFFECT OF THE VYVANSE     baclofen (LIORESAL) 10 MG tablet      cloNIDine (CATAPRES - DOSED IN MG/24 HR) 0.2 mg/24hr patch      clotrimazole (MYCELEX) 10 MG troche      cyclobenzaprine (FLEXERIL) 5 MG tablet      dexamethasone (DECADRON) 2 MG tablet      DULoxetine (CYMBALTA) 60 MG capsule  EPINEPHrine 0.3 mg/0.3 mL IJ SOAJ injection Inject 0.3 mg into the muscle as needed for anaphylaxis.     estradiol (ESTRACE) 0.1 MG/GM vaginal cream SMARTSIG:0.5 Gram(s) Vaginal 2-3 Times Weekly     hydrocortisone (ANUSOL-HC) 25 MG suppository      leflunomide (ARAVA) 20 MG tablet Take 20 mg by mouth daily.     LINZESS 72 MCG capsule Take 72 mcg by mouth daily.     LORazepam (ATIVAN) 1 MG tablet      meloxicam (MOBIC) 15 MG tablet      ondansetron (ZOFRAN-ODT) 4 MG disintegrating tablet      traZODone (DESYREL) 50 MG tablet Take 50-100 mg by mouth at bedtime.     triamcinolone cream (KENALOG) 0.1 % Apply 1 application topically 2 (two) times daily as needed.     Vitamin D, Ergocalciferol, (DRISDOL) 1.25 MG (50000 UNIT) CAPS capsule Take 50,000 Units by mouth once a week.     zolmitriptan (ZOMIG) 5 MG nasal solution PLACE 1 SPRAY INTO THE NOSE AS NEEDED FOR MIGRAINE. 6 each 5   albuterol (ACCUNEB) 1.25 MG/3ML nebulizer solution Take 3 mLs (1.25 mg total) by nebulization every 4 (four) hours as needed for up to 15 days for wheezing or shortness of breath (cough). 270 mL 0   albuterol (VENTOLIN HFA) 108 (90 Base) MCG/ACT inhaler Inhale 2 puffs into the lungs every 6 (six) hours as needed for wheezing or shortness of breath (Cough). 18 g 0    amphetamine-dextroamphetamine (ADDERALL) 10 MG tablet Take 5-10 mg by mouth 3 (three) times daily.     cloNIDine (CATAPRES) 0.2 MG tablet Take 0.2 mg by mouth at bedtime.     DULoxetine (CYMBALTA) 30 MG capsule Take by mouth.     folic acid (FOLVITE) 1 MG tablet Take 1 mg by mouth daily.     gabapentin (NEURONTIN) 100 MG capsule 1 capsule     gabapentin (NEURONTIN) 400 MG capsule Take 2 capsules (800 mg total) by mouth 3 (three) times daily for 14 days. 84 capsule 0   meloxicam (MOBIC) 7.5 MG tablet Take 7.5 mg by mouth daily.     rosuvastatin (CRESTOR) 5 MG tablet Take 1 tablet (5 mg total) by mouth 3 (three) times a week. 13 tablet 3   No facility-administered medications prior to visit.    Allergies  Allergen Reactions   Capsaicin Cough, Other (See Comments), Diarrhea and Nausea Only        Piper Other (See Comments)   Black Pepper-Turmeric Other (See Comments)   Methotrexate    Nsaids     Stomach pain   Other Other (See Comments)    ANY PEPPER - flu like symptoms     Rosuvastatin Other (See Comments)   Atorvastatin Other (See Comments)    Muscle, joint pain   Flagyl [Metronidazole] Rash   Prednisone Anxiety and Other (See Comments)    ROS Pertinent positives and negatives in the history of present illness.     Objective:    Physical Exam Constitutional:      General: She is not in acute distress.    Appearance: She is not ill-appearing.  HENT:     Right Ear: Tympanic membrane and ear canal normal.     Left Ear: Tympanic membrane and ear canal normal.     Mouth/Throat:     Mouth: Mucous membranes are moist.  Eyes:     Extraocular Movements: Extraocular movements intact.     Conjunctiva/sclera: Conjunctivae normal.  Pupils: Pupils are equal, round, and reactive to light.  Cardiovascular:     Rate and Rhythm: Regular rhythm. Tachycardia present.     Pulses: Normal pulses.  Pulmonary:     Effort: Pulmonary effort is normal.     Breath sounds: Normal breath  sounds.  Musculoskeletal:        General: Normal range of motion.     Cervical back: Normal range of motion and neck supple. No tenderness.  Lymphadenopathy:     Cervical: No cervical adenopathy.  Skin:    General: Skin is warm and dry.  Neurological:     Mental Status: She is alert and oriented to person, place, and time.     Cranial Nerves: No facial asymmetry.     Motor: Motor function is intact.  Psychiatric:        Mood and Affect: Mood is anxious.        Speech: Speech normal.     BP 124/62 (BP Location: Left Arm, Patient Position: Sitting, Cuff Size: Large)   Pulse (!) 116   Temp 97.8 F (36.6 C) (Temporal)   Ht '5\' 7"'$  (1.702 m)   Wt 162 lb (73.5 kg)   LMP 01/19/2020   SpO2 98%   BMI 25.37 kg/m  Wt Readings from Last 3 Encounters:  05/05/22 162 lb (73.5 kg)  01/18/22 174 lb (78.9 kg)  12/03/21 177 lb 12.8 oz (80.6 kg)       Assessment & Plan:   Problem List Items Addressed This Visit       Cardiovascular and Mediastinum   Migraine without aura and with status migrainosus, not intractable (Chronic)    Continue usual regimen with Zomig. She has refills. Follow up with PCP as needed.       Relevant Medications   cloNIDine (CATAPRES - DOSED IN MG/24 HR) 0.2 mg/24hr patch   DULoxetine (CYMBALTA) 60 MG capsule   baclofen (LIORESAL) 10 MG tablet   meloxicam (MOBIC) 15 MG tablet   cyclobenzaprine (FLEXERIL) 5 MG tablet   Acetaminophen 500 MG capsule     Other   ADHD   Frequent headaches - Primary    She is having what appears to be headaches related to recent dental procedures and not her usual migraine headaches. Started taking NSAID yesterday. No headache today. Discussed option of steroid injection in office today. She declined initially then returned to have the injection only to decline again. She did not receive an injection today.       Relevant Medications   DULoxetine (CYMBALTA) 60 MG capsule   baclofen (LIORESAL) 10 MG tablet   meloxicam (MOBIC)  15 MG tablet   cyclobenzaprine (FLEXERIL) 5 MG tablet   Acetaminophen 500 MG capsule   GAD (generalized anxiety disorder)    She is quite anxious today. Has not taken usual medication for ADHD. Follow up with psychiatrist as needed.       Relevant Medications   DULoxetine (CYMBALTA) 60 MG capsule   LORazepam (ATIVAN) 1 MG tablet   History of oral surgery    Undergoing intense dental treatments. Pain is being managed by oral surgeon. Continue NSAID as tolerated.        I have discontinued Rajvi L. Tal's gabapentin, rosuvastatin, albuterol, albuterol, folic acid, and gabapentin. I am also having her maintain her EPINEPHrine, traZODone, triamcinolone cream, Linzess, Vitamin D (Ergocalciferol), estradiol, leflunomide, zolmitriptan, cloNIDine, DULoxetine, amphetamine-dextroamphetamine, baclofen, hydrocortisone, ondansetron, meloxicam, LORazepam, dexamethasone, cyclobenzaprine, clotrimazole, and Acetaminophen.  No orders of the defined types were  placed in this encounter.

## 2022-05-05 NOTE — Patient Instructions (Addendum)
Continue the treatment plan recommended by your specialists.  Take the meloxicam as recommended. You have refills on your migraine medication.  Follow-up with Dr. Quay Burow

## 2022-05-08 DIAGNOSIS — Z9889 Other specified postprocedural states: Secondary | ICD-10-CM | POA: Insufficient documentation

## 2022-05-08 DIAGNOSIS — R519 Headache, unspecified: Secondary | ICD-10-CM | POA: Insufficient documentation

## 2022-05-08 NOTE — Assessment & Plan Note (Signed)
She is having what appears to be headaches related to recent dental procedures and not her usual migraine headaches. Started taking NSAID yesterday. No headache today. Discussed option of steroid injection in office today. She declined initially then returned to have the injection only to decline again. She did not receive an injection today.

## 2022-05-08 NOTE — Assessment & Plan Note (Signed)
She is quite anxious today. Has not taken usual medication for ADHD. Follow up with psychiatrist as needed.

## 2022-05-08 NOTE — Assessment & Plan Note (Signed)
Undergoing intense dental treatments. Pain is being managed by oral surgeon. Continue NSAID as tolerated.

## 2022-05-08 NOTE — Assessment & Plan Note (Signed)
Continue usual regimen with Zomig. She has refills. Follow up with PCP as needed.

## 2022-05-26 DIAGNOSIS — F4310 Post-traumatic stress disorder, unspecified: Secondary | ICD-10-CM | POA: Diagnosis not present

## 2022-05-26 DIAGNOSIS — F411 Generalized anxiety disorder: Secondary | ICD-10-CM | POA: Diagnosis not present

## 2022-05-26 DIAGNOSIS — F332 Major depressive disorder, recurrent severe without psychotic features: Secondary | ICD-10-CM | POA: Diagnosis not present

## 2022-05-26 DIAGNOSIS — F900 Attention-deficit hyperactivity disorder, predominantly inattentive type: Secondary | ICD-10-CM | POA: Diagnosis not present

## 2022-06-07 ENCOUNTER — Encounter: Payer: Federal, State, Local not specified - PPO | Admitting: Internal Medicine

## 2022-06-07 DIAGNOSIS — M1990 Unspecified osteoarthritis, unspecified site: Secondary | ICD-10-CM | POA: Diagnosis not present

## 2022-06-07 DIAGNOSIS — Z79899 Other long term (current) drug therapy: Secondary | ICD-10-CM | POA: Diagnosis not present

## 2022-06-07 DIAGNOSIS — E559 Vitamin D deficiency, unspecified: Secondary | ICD-10-CM | POA: Diagnosis not present

## 2022-06-07 DIAGNOSIS — M791 Myalgia, unspecified site: Secondary | ICD-10-CM | POA: Diagnosis not present

## 2022-06-15 ENCOUNTER — Encounter: Payer: Self-pay | Admitting: Internal Medicine

## 2022-06-15 NOTE — Patient Instructions (Addendum)
Blood work was ordered.     Medications changes include :   None    Return in about 1 year (around 06/22/2023) for Physical Exam.   Health Maintenance, Female Adopting a healthy lifestyle and getting preventive care are important in promoting health and wellness. Ask your health care provider about: The right schedule for you to have regular tests and exams. Things you can do on your own to prevent diseases and keep yourself healthy. What should I know about diet, weight, and exercise? Eat a healthy diet  Eat a diet that includes plenty of vegetables, fruits, low-fat dairy products, and lean protein. Do not eat a lot of foods that are high in solid fats, added sugars, or sodium. Maintain a healthy weight Body mass index (BMI) is used to identify weight problems. It estimates body fat based on height and weight. Your health care provider can help determine your BMI and help you achieve or maintain a healthy weight. Get regular exercise Get regular exercise. This is one of the most important things you can do for your health. Most adults should: Exercise for at least 150 minutes each week. The exercise should increase your heart rate and make you sweat (moderate-intensity exercise). Do strengthening exercises at least twice a week. This is in addition to the moderate-intensity exercise. Spend less time sitting. Even light physical activity can be beneficial. Watch cholesterol and blood lipids Have your blood tested for lipids and cholesterol at 53 years of age, then have this test every 5 years. Have your cholesterol levels checked more often if: Your lipid or cholesterol levels are high. You are older than 53 years of age. You are at high risk for heart disease. What should I know about cancer screening? Depending on your health history and family history, you may need to have cancer screening at various ages. This may include screening for: Breast cancer. Cervical  cancer. Colorectal cancer. Skin cancer. Lung cancer. What should I know about heart disease, diabetes, and high blood pressure? Blood pressure and heart disease High blood pressure causes heart disease and increases the risk of stroke. This is more likely to develop in people who have high blood pressure readings or are overweight. Have your blood pressure checked: Every 3-5 years if you are 8-95 years of age. Every year if you are 49 years old or older. Diabetes Have regular diabetes screenings. This checks your fasting blood sugar level. Have the screening done: Once every three years after age 69 if you are at a normal weight and have a low risk for diabetes. More often and at a younger age if you are overweight or have a high risk for diabetes. What should I know about preventing infection? Hepatitis B If you have a higher risk for hepatitis B, you should be screened for this virus. Talk with your health care provider to find out if you are at risk for hepatitis B infection. Hepatitis C Testing is recommended for: Everyone born from [birth year] through 1967. Anyone with known risk factors for hepatitis C. Sexually transmitted infections (STIs) Get screened for STIs, including gonorrhea and chlamydia, if: You are sexually active and are younger than 53 years of age. You are older than 53 years of age and your health care provider tells you that you are at risk for this type of infection. Your sexual activity has changed since you were last screened, and you are at increased risk for chlamydia or gonorrhea. Ask your health care  provider if you are at risk. Ask your health care provider about whether you are at high risk for HIV. Your health care provider may recommend a prescription medicine to help prevent HIV infection. If you choose to take medicine to prevent HIV, you should first get tested for HIV. You should then be tested every 3 months for as long as you are taking the  medicine. Pregnancy If you are about to stop having your period (premenopausal) and you may become pregnant, seek counseling before you get pregnant. Take 400 to 800 micrograms (mcg) of folic acid every day if you become pregnant. Ask for birth control (contraception) if you want to prevent pregnancy. Osteoporosis and menopause Osteoporosis is a disease in which the bones lose minerals and strength with aging. This can result in bone fractures. If you are 26 years old or older, or if you are at risk for osteoporosis and fractures, ask your health care provider if you should: Be screened for bone loss. Take a calcium or vitamin D supplement to lower your risk of fractures. Be given hormone replacement therapy (HRT) to treat symptoms of menopause. Follow these instructions at home: Alcohol use Do not drink alcohol if: Your health care provider tells you not to drink. You are pregnant, may be pregnant, or are planning to become pregnant. If you drink alcohol: Limit how much you have to: 0-1 drink a day. Know how much alcohol is in your drink. In the [place] one drink equals one 12 oz bottle of beer (355 mL), one 5 oz glass of wine (148 mL), or one 1 oz glass of hard liquor (44 mL). Lifestyle Do not use any products that contain nicotine or tobacco. These products include cigarettes, chewing tobacco, and vaping devices, such as e-cigarettes. If you need help quitting, ask your health care provider. Do not use street drugs. Do not share needles. Ask your health care provider for help if you need support or information about quitting drugs. General instructions Schedule regular health, dental, and eye exams. Stay current with your vaccines. Tell your health care provider if: You often feel depressed. You have ever been abused or do not feel safe at home. Summary Adopting a healthy lifestyle and getting preventive care are important in promoting health and wellness. Follow your health care  provider's instructions about healthy diet, exercising, and getting tested or screened for diseases. Follow your health care provider's instructions on monitoring your cholesterol and blood pressure. This information is not intended to replace advice given to you by your health care provider. Make sure you discuss any questions you have with your health care provider. Document Revised: 03/08/2021 Document Reviewed: 03/08/2021 Elsevier Patient Education  [place].

## 2022-06-15 NOTE — Progress Notes (Signed)
Subjective:    Patient ID: Pamela Novak, female    DOB: 05-15-1971, 51 y.o.   MRN: [id number]      HPI Pamela Novak is here for a Physical exam.   Had dental abscess and needed bone graft.  This caused increased migraines.  She was able to get her zomig refilled and that helped.    She injured her right neck with radiating pain into the right arm and chest/axilla area - the radiating pain only occurs with certain head movements.  It has gotten better.    Medications and allergies reviewed with patient and updated if appropriate.  Current Outpatient Medications on File Prior to Visit  Medication Sig Dispense Refill   Acetaminophen 500 MG capsule      amphetamine-dextroamphetamine (ADDERALL) 20 MG tablet TAKE 1 TABLET BY MOUTH EVERY AM AND ONE TABLET IN AFTERNOON TO EXTEND EFFECT OF THE VYVANSE     baclofen (LIORESAL) 10 MG tablet      cloNIDine (CATAPRES - DOSED IN MG/24 HR) 0.2 mg/24hr patch      clotrimazole (MYCELEX) 10 MG troche      cyclobenzaprine (FLEXERIL) 5 MG tablet      dexamethasone (DECADRON) 2 MG tablet      DULoxetine (CYMBALTA) 60 MG capsule      EPINEPHrine 0.3 mg/0.3 mL IJ SOAJ injection Inject 0.3 mg into the muscle as needed for anaphylaxis.     estradiol (ESTRACE) 0.1 MG/GM vaginal cream SMARTSIG:0.5 Gram(s) Vaginal 2-3 Times Weekly     hydrocortisone (ANUSOL-HC) 25 MG suppository      leflunomide (ARAVA) 20 MG tablet Take 20 mg by mouth daily.     LINZESS 72 MCG capsule Take 72 mcg by mouth daily.     LORazepam (ATIVAN) 1 MG tablet      meloxicam (MOBIC) 15 MG tablet      ondansetron (ZOFRAN-ODT) 4 MG disintegrating tablet      traZODone (DESYREL) 50 MG tablet Take 50-100 mg by mouth at bedtime.     triamcinolone cream (KENALOG) 0.1 % Apply 1 application topically 2 (two) times daily as needed.     Vitamin D, Ergocalciferol, (DRISDOL) 1.25 MG (50000 UNIT) CAPS capsule Take 50,000 Units by mouth once a week.     zolmitriptan (ZOMIG) 5 MG nasal solution PLACE  1 SPRAY INTO THE NOSE AS NEEDED FOR MIGRAINE. 6 each 5   No current facility-administered medications on file prior to visit.    Review of Systems  Constitutional:  Negative for fever.  Eyes:  Negative for visual disturbance.  Respiratory:  Negative for cough, shortness of breath and wheezing.   Cardiovascular:  Negative for chest pain, palpitations and leg swelling.  Gastrointestinal:  Positive for anal bleeding (assoc with constipation) and constipation. Negative for abdominal pain, blood in stool, diarrhea and nausea.       No gerd since surgery  Genitourinary:  Negative for dysuria.  Musculoskeletal:  Positive for arthralgias, back pain (acute right neck and uppper back - muscular) and neck pain (acute right neck and uppper back - muscular).  Skin:  Negative for rash.  Neurological:  Positive for headaches (migraines). Negative for light-headedness.  Psychiatric/Behavioral:  Positive for dysphoric mood. The patient is nervous/anxious and is hyperactive.        Objective:   Vitals:   06/21/22 0752  BP: 110/80  Pulse: 90  Temp: 97.9 F (36.6 C)  SpO2: 97%   Filed Weights   06/21/22 0752  Weight: 167 lb (75.8  kg)   Body mass index is 26.16 kg/m.  BP Readings from Last 3 Encounters:  06/21/22 110/80  05/05/22 124/62  04/02/22 134/82    Wt Readings from Last 3 Encounters:  06/21/22 167 lb (75.8 kg)  05/05/22 162 lb (73.5 kg)  01/18/22 174 lb (78.9 kg)       Physical Exam Constitutional: She appears well-developed and well-nourished. No distress.  HENT:  Head: Normocephalic and atraumatic.  Right Ear: External ear normal. Normal ear canal and TM Left Ear: External ear normal.  Normal ear canal and TM Mouth/Throat: Oropharynx is clear and moist.  Eyes: Conjunctivae normal.  Neck: Neck supple. No tracheal deviation present. No thyromegaly present.  No carotid bruit  Cardiovascular: Normal rate, regular rhythm and normal heart sounds.   No murmur heard.  No  edema. Pulmonary/Chest: Effort normal and breath sounds normal. No respiratory distress. She has no wheezes. She has no rales.  Breast: deferred   Abdominal: Soft. She exhibits no distension. There is no tenderness.  Lymphadenopathy: She has no cervical adenopathy.  Skin: Skin is warm and dry. She is not diaphoretic.  Psychiatric: She has a normal mood and affect. Her behavior is normal.     Lab Results  Component Value Date   WBC 4.6 01/18/2022   HGB 12.7 01/18/2022   HCT 37.6 01/18/2022   PLT 198.0 01/18/2022   GLUCOSE 100 (H) 01/18/2022   CHOL 197 01/18/2022   TRIG 236.0 (H) 01/18/2022   HDL 38.10 (L) 01/18/2022   LDLDIRECT 114.0 01/18/2022   LDLCALC 123 (H) 05/28/2021   ALT 12 01/18/2022   AST 16 01/18/2022   NA 142 01/18/2022   K 3.8 01/18/2022   CL 105 01/18/2022   CREATININE 1.06 01/18/2022   BUN 13 01/18/2022   CO2 29 01/18/2022   TSH 2.11 05/28/2021   HGBA1C 6.3 01/18/2022    The 10-year ASCVD risk score (Arnett DK, et al., 2019) is: 1.6%   Values used to calculate the score:     Age: 52 years     Sex: Female     Is Non-Hispanic African American: No     Diabetic: No     Tobacco smoker: No     Systolic Blood Pressure: 193 mmHg     Is BP treated: No     HDL Cholesterol: 38.1 mg/dL     Total Cholesterol: 197 mg/dL      Assessment & Plan:   Physical exam: Screening blood work  ordered Exercise  walking the dog irregularly, active - no regimented exercise Weight  is good Substance abuse  none   Reviewed recommended immunizations.   Health Maintenance  Topic Date Due   INFLUENZA VACCINE  05/31/2022   COVID-19 Vaccine (6 - Mixed Product risk series) 07/07/2022 (Originally 04/24/2020)   MAMMOGRAM  11/26/2022   COLONOSCOPY (Pts 45-12yr Insurance coverage will need to be confirmed)  07/18/2023   PAP SMEAR-Modifier  11/27/2023   TETANUS/TDAP  01/17/2026   Hepatitis C Screening  Completed   HIV Screening  Completed   HPV VACCINES  Aged Out   Zoster  Vaccines- Shingrix  Discontinued          See Problem List for Assessment and Plan of chronic medical problems.

## 2022-06-17 ENCOUNTER — Encounter: Payer: Self-pay | Admitting: Internal Medicine

## 2022-06-21 ENCOUNTER — Ambulatory Visit (INDEPENDENT_AMBULATORY_CARE_PROVIDER_SITE_OTHER): Payer: Federal, State, Local not specified - PPO | Admitting: Internal Medicine

## 2022-06-21 VITALS — BP 110/80 | HR 90 | Temp 97.9°F | Ht 67.0 in | Wt 167.0 lb

## 2022-06-21 DIAGNOSIS — G43001 Migraine without aura, not intractable, with status migrainosus: Secondary | ICD-10-CM

## 2022-06-21 DIAGNOSIS — F3289 Other specified depressive episodes: Secondary | ICD-10-CM

## 2022-06-21 DIAGNOSIS — K5909 Other constipation: Secondary | ICD-10-CM

## 2022-06-21 DIAGNOSIS — F411 Generalized anxiety disorder: Secondary | ICD-10-CM | POA: Diagnosis not present

## 2022-06-21 DIAGNOSIS — E7849 Other hyperlipidemia: Secondary | ICD-10-CM

## 2022-06-21 DIAGNOSIS — Z0000 Encounter for general adult medical examination without abnormal findings: Secondary | ICD-10-CM

## 2022-06-21 DIAGNOSIS — M069 Rheumatoid arthritis, unspecified: Secondary | ICD-10-CM | POA: Insufficient documentation

## 2022-06-21 DIAGNOSIS — R7303 Prediabetes: Secondary | ICD-10-CM | POA: Diagnosis not present

## 2022-06-21 DIAGNOSIS — E559 Vitamin D deficiency, unspecified: Secondary | ICD-10-CM | POA: Insufficient documentation

## 2022-06-21 DIAGNOSIS — M0609 Rheumatoid arthritis without rheumatoid factor, multiple sites: Secondary | ICD-10-CM

## 2022-06-21 LAB — COMPREHENSIVE METABOLIC PANEL
ALT: 14 U/L (ref 0–35)
AST: 14 U/L (ref 0–37)
Albumin: 4.1 g/dL (ref 3.5–5.2)
Alkaline Phosphatase: 59 U/L (ref 39–117)
BUN: 16 mg/dL (ref 6–23)
CO2: 29 mEq/L (ref 19–32)
Calcium: 9.2 mg/dL (ref 8.4–10.5)
Chloride: 102 mEq/L (ref 96–112)
Creatinine, Ser: 0.84 mg/dL (ref 0.40–1.20)
GFR: 79.7 mL/min (ref >60.00)
Glucose, Bld: 113 mg/dL — ABNORMAL HIGH (ref 70–99)
Potassium: 4.4 mEq/L (ref 3.5–5.1)
Sodium: 139 mEq/L (ref 135–145)
Total Bilirubin: 0.4 mg/dL (ref 0.2–1.2)
Total Protein: 6.3 g/dL (ref 6.0–8.3)

## 2022-06-21 LAB — CBC WITH DIFFERENTIAL/PLATELET
Basophils Absolute: 0 10*3/uL (ref 0.0–0.1)
Basophils Relative: 0.4 % (ref 0.0–3.0)
Eosinophils Absolute: 0 10*3/uL (ref 0.0–0.7)
Eosinophils Relative: 0.2 % (ref 0.0–5.0)
HCT: 39.5 % (ref 36.0–46.0)
Hemoglobin: 13.3 g/dL (ref 12.0–15.0)
Lymphocytes Relative: 7.3 % — ABNORMAL LOW (ref 12.0–46.0)
Lymphs Abs: 0.6 10*3/uL — ABNORMAL LOW (ref 0.7–4.0)
MCHC: 33.5 g/dL (ref 30.0–36.0)
MCV: 86.9 fl (ref 78.0–100.0)
Monocytes Absolute: 0.4 10*3/uL (ref 0.1–1.0)
Monocytes Relative: 5.1 % (ref 3.0–12.0)
Neutro Abs: 6.8 10*3/uL (ref 1.4–7.7)
Neutrophils Relative %: 87 % — ABNORMAL HIGH (ref 43.0–77.0)
Platelets: 238 10*3/uL (ref 150.0–400.0)
RBC: 4.55 Mil/uL (ref 3.87–5.11)
RDW: 14.2 % (ref 11.5–15.5)
WBC: 7.9 10*3/uL (ref 4.0–10.5)

## 2022-06-21 LAB — LIPID PANEL
Cholesterol: 285 mg/dL — ABNORMAL HIGH (ref 0–200)
HDL: 64.2 mg/dL (ref >39.00)
LDL Cholesterol: 189 mg/dL — ABNORMAL HIGH (ref 0–99)
NonHDL: 220.45
Total CHOL/HDL Ratio: 4
Triglycerides: 157 mg/dL — ABNORMAL HIGH (ref 0.0–149.0)
VLDL: 31.4 mg/dL (ref 0.0–40.0)

## 2022-06-21 LAB — TSH: TSH: 0.38 u[IU]/mL (ref 0.35–5.50)

## 2022-06-21 LAB — HEMOGLOBIN A1C: Hgb A1c MFr Bld: 6.1 % (ref 4.6–6.5)

## 2022-06-21 NOTE — Assessment & Plan Note (Signed)
Following rheum On arava

## 2022-06-21 NOTE — Assessment & Plan Note (Signed)
Chronic Management per psychiatry On duloxetine

## 2022-06-21 NOTE — Assessment & Plan Note (Signed)
Chronic Check a1c Low sugar / carb diet Stressed regular exercise  

## 2022-06-21 NOTE — Assessment & Plan Note (Signed)
Chronic Migraines controlled Continue Zomig 5 mg nasal spray as needed, Zofran 4 mg ODT as needed

## 2022-06-21 NOTE — Assessment & Plan Note (Signed)
Chronic Regular exercise and healthy diet encouraged Check lipid panel, CMP Did not tolerate Crestor-caused muscle pain

## 2022-06-21 NOTE — Assessment & Plan Note (Signed)
Chronic Management per psychiatry On duloxetine, lorazepam

## 2022-06-21 NOTE — Assessment & Plan Note (Signed)
On linzess

## 2022-06-23 ENCOUNTER — Encounter: Payer: Self-pay | Admitting: Internal Medicine

## 2022-06-24 ENCOUNTER — Encounter: Payer: Self-pay | Admitting: Gastroenterology

## 2022-06-24 ENCOUNTER — Ambulatory Visit: Payer: Federal, State, Local not specified - PPO | Admitting: Gastroenterology

## 2022-06-24 VITALS — BP 110/80 | HR 86 | Ht 67.0 in | Wt 171.0 lb

## 2022-06-24 DIAGNOSIS — K581 Irritable bowel syndrome with constipation: Secondary | ICD-10-CM

## 2022-06-24 DIAGNOSIS — K641 Second degree hemorrhoids: Secondary | ICD-10-CM

## 2022-06-24 DIAGNOSIS — M542 Cervicalgia: Secondary | ICD-10-CM | POA: Diagnosis not present

## 2022-06-24 MED ORDER — PRAMOXINE-HC 1-1 % EX CREA: TOPICAL_CREAM | Freq: Every day | CUTANEOUS | 1 refills | Status: DC | PRN

## 2022-06-24 MED ORDER — LINZESS 72 MCG PO CAPS: 72.0000 ug | ORAL_CAPSULE | Freq: Every day | ORAL | 3 refills | Status: DC

## 2022-06-24 NOTE — Patient Instructions (Signed)
We have sent the following medications to your pharmacy for you to pick up at your convenience: Linzess and analpram.  _______________________________________________________  If you are age 53 or older, your body mass index should be between 23-30. Your Body mass index is 26.78 kg/m. If this is out of the aforementioned range listed, please consider follow up with your Primary Care Provider.  If you are age 55 or younger, your body mass index should be between 19-25. Your Body mass index is 26.78 kg/m. If this is out of the aformentioned range listed, please consider follow up with your Primary Care Provider.   ________________________________________________________  The [facility] providers would like to encourage you to use [facility] to communicate with providers for non-urgent requests or questions.  Due to long hold times on the telephone, sending your provider a message by [facility] may be a faster and more efficient way to get a response.  Please allow 48 business hours for a response.  Please remember that this is for non-urgent requests.  _______________________________________________________

## 2022-06-24 NOTE — Progress Notes (Signed)
Pamela Novak    [id number]    Mar 12, 1971  Primary Care Physician:Burns, Claudina Lick, MD  Referring Physician: [name], MD [place],  [place] [id number]   Chief complaint: Constipation, irritable bowel syndrome  HPI:  51 year old pleasant female with history of chronic GERD s/p Nissen fundoplication here for follow-up visit for irritable bowel syndrome and chronic constipation.  Overall she feels her bowel habits have improved s/p anterior and posterior pelvic floor repair, TAH and BSO She did pelvic floor physical therapy  48-hour pH Bravo 10/19/2021:Normal DeMeester score 3.7 and percentage pH less than 4 of 1.4%    She feels the fundoplication has helped her GERD symptoms to a large extent.  She does not have severe heartburn like she was having before.   Upper GI series showed flow of barium through EG junction and no gastroesophageal reflux   Denies any dysphagia, odynophagia, vomiting, abdominal pain, melena or blood per rectum.   EGD May 10, 2017 LA grade D reflux esophagitis, gastritis and gastric nodule biopsied negative for dysplasia, intestinal metaplasia or malignancy.  H. pylori negative. Colonoscopy July 04, 2018: 2 sessile serrated adenomatous polyps removed, diverticulosis and hemorrhoids   Outpatient Encounter Medications as of 06/24/2022  Medication Sig   Acetaminophen 500 MG capsule    cloNIDine (CATAPRES - DOSED IN MG/24 HR) 0.2 mg/24hr patch    clotrimazole (MYCELEX) 10 MG troche    cyclobenzaprine (FLEXERIL) 5 MG tablet    dexamethasone (DECADRON) 2 MG tablet    DULoxetine (CYMBALTA) 60 MG capsule    EPINEPHrine 0.3 mg/0.3 mL IJ SOAJ injection Inject 0.3 mg into the muscle as needed for anaphylaxis.   estradiol (ESTRACE) 0.1 MG/GM vaginal cream SMARTSIG:0.5 Gram(s) Vaginal 2-3 Times Weekly   hydrocortisone (ANUSOL-HC) 25 MG suppository    ibuprofen (ADVIL) 600 MG tablet Take 600 mg by mouth every 6 (six) hours as  needed.   leflunomide (ARAVA) 20 MG tablet Take 20 mg by mouth daily.   LINZESS 72 MCG capsule Take 72 mcg by mouth daily.   LORazepam (ATIVAN) 1 MG tablet    meloxicam (MOBIC) 15 MG tablet    ondansetron (ZOFRAN-ODT) 4 MG disintegrating tablet    traZODone (DESYREL) 50 MG tablet Take 50-100 mg by mouth at bedtime.   triamcinolone cream (KENALOG) 0.1 % Apply 1 application topically 2 (two) times daily as needed.   Vitamin D, Ergocalciferol, (DRISDOL) 1.25 MG (50000 UNIT) CAPS capsule Take 50,000 Units by mouth once a week.   zolmitriptan (ZOMIG) 5 MG nasal solution PLACE 1 SPRAY INTO THE NOSE AS NEEDED FOR MIGRAINE.   amphetamine-dextroamphetamine (ADDERALL) 20 MG tablet TAKE 1 TABLET BY MOUTH EVERY AM AND ONE TABLET IN AFTERNOON TO EXTEND EFFECT OF THE VYVANSE (Patient not taking: Reported on 06/24/2022)   baclofen (LIORESAL) 10 MG tablet  (Patient not taking: Reported on 06/24/2022)   No facility-administered encounter medications on file as of 06/24/2022.    Allergies as of 06/24/2022 - Review Complete 06/24/2022  Allergen Reaction Noted   Capsaicin Cough, Other (See Comments), Diarrhea, and Nausea Only 03/31/2006   Piper Other (See Comments) 05/05/2022   Black pepper-turmeric Other (See Comments) 11/23/2021   Methotrexate  05/05/2022   Nsaids  08/30/2019   Other Other (See Comments) 09/19/2018   Rosuvastatin Other (See Comments) 12/29/2021   Atorvastatin Other (See Comments) 12/03/2021   Flagyl [metronidazole] Rash 09/11/2012   Prednisone Anxiety and Other (See Comments) 03/17/2022  Past Medical History:  Diagnosis Date   ADHD    Allergy    Anxiety    severe   Arthritis    Bruxism (teeth grinding) 06/23/2021   WEARS  NIGHT GUARD WILL BRING DOS   Complication of anesthesia    "woke up during colonoscopy" age 7 years old   Constipation    Depression    Diverticulosis    Folliculitis 65/46/5035   ACNE PER PT   GERD (gastroesophageal reflux disease)    occ hiccup    Hemorrhoids    History of colon polyps    Hyperlipidemia    IBS (irritable bowel syndrome)    Migraines    Pre-diabetes    PTSD (post-traumatic stress disorder)    severe per psychiatry note dr Pearson Grippe 06-18-2021   Restless legs syndrome (RLS)    Sciatica    Right side   Varicose veins of lower extremity    in vagina also and both legs   Wears glasses 06/23/2021    Past Surgical History:  Procedure Laterality Date   24 HOUR [place] STUDY N/A 04/01/2019   Procedure: 24 HOUR PH STUDY;  Surgeon: Mauri Pole, MD;  Location: [facility];  Service: Endoscopy;  Laterality: N/A;   ABLATION ON ENDOMETRIOSIS  10/04/2012   Procedure: ABLATION ON ENDOMETRIOSIS;  Surgeon: Linda Hedges, DO;  Location: [facility];  Service: Gynecology;  Laterality: N/A;   ANTERIOR AND POSTERIOR REPAIR WITH SACROSPINOUS FIXATION N/A 06/29/2021   Procedure: ANTERIOR AND POSTERIOR REPAIR;  Surgeon: Linda Hedges, DO;  Location: [facility];  Service: Gynecology;  Laterality: N/A;   bravo test  03/11/2021   COLONOSCOPY  2019   [id number]   CONDYLOMA EXCISION/FULGURATION  10/04/2012   Procedure: CONDYLOMA REMOVAL;  Surgeon: MLinda Hedges DO;  [facility];  Service: Gynecology;  Laterality: N/A;   CYSTOSCOPY N/A 06/29/2021   Procedure: CYSTOSCOPY;  Surgeon: MLinda Hedges DO;  [place] [facility]  Service: Gynecology;  Laterality: N/A;   DILITATION & CURRETTAGE/HYSTROSCOPY WITH NOVASURE ABLATION  10/04/2012   Procedure: DILATATION & CURETTAGE/HYSTEROSCOPY WITH NOVASURE ABLATION;  Surgeon: MLinda Hedges DO;  [facility];  Service: Gynecology;  Laterality: N/A;   ESOPHAGEAL MANOMETRY N/A 04/01/2019   Procedure: ESOPHAGEAL MANOMETRY (EM);  Surgeon: NMauri Pole MD;  [facility];  Service: Endoscopy;  Laterality: N/A;   HEMORRHOID BANDING     multiple times yrs ago   LAPAROSCOPIC NISSEN FUNDOPLICATION N/A 146/[id number]/8127  Procedure: LAPAROSCOPIC HIATAL HERNIA  REPAIR AND NISSEN FUNDOPLICATION;  Surgeon: WGreer Pickerel MD;  Location: [facility];  Service: General;  Laterality: N/A;   LAPAROSCOPIC VAGINAL HYSTERECTOMY WITH SALPINGECTOMY Bilateral 06/29/2021   Procedure: LAPAROSCOPIC ASSISTED VAGINAL HYSTERECTOMY WITH BIILATERAL SALPINGECTOMY; CAUTERY OF VULVAR HEMANGIOMAS AND EXCISION OF LESION AT INTROITUS;  Surgeon: MLinda Hedges DO;  [facility]  Service: Gynecology;  Laterality: Bilateral;   LAPAROSCOPY  10/04/2012   Procedure: LAPAROSCOPY OPERATIVE;  Surgeon: MLinda Hedges DO;  [facility];  Service: Gynecology;  Laterality: N/A;   MASS EXCISION Bilateral 09/05/2019   Procedure: EXCISION BILATERAL SOFT TISSUE MASSES, THIGH;  Surgeon: WGreer Pickerel MD;  Location: [facility];  Service: General;  Laterality: Bilateral;   RADIOFREQUENCY ABLATION NERVES     sciatic nerve last 3 to 4 vertebrate lower and spine and sciatic nerve both legs   UPPER GI ENDOSCOPY  04/01/2019    Family History  Problem Relation Age of Onset   Heart disease Mother  Arthritis Mother    Hyperlipidemia Mother    Hypertension Mother    Heart attack Mother    Colon polyps Father    Hashimoto's thyroiditis Sister    Diabetes Sister    Thyroid disease Sister    Colon cancer Paternal Grandfather    Thyroid disease Sister     Social History   Socioeconomic History   Marital status: Married    Spouse name: [name]   Number of children: 2   Years of education: 12+   Highest education level: Not on file  Occupational History   Occupation: adm Radiation protection practitioner: [employer]  Tobacco Use   Smoking status: Never   Smokeless tobacco: Never  Vaping Use   Vaping Use: Never used  Substance and Sexual Activity   Alcohol use: Not Currently   Drug use: No   Sexual activity: Yes  Other Topics Concern   Not on file  Social History Narrative   Regular exercise-no   Caffeine Use-yes   Social Determinants of Health   Financial Resource  Strain: Not on file  Food Insecurity: Not on file  Transportation Needs: Not on file  Physical Activity: Not on file  Stress: Not on file  Social Connections: Not on file  Intimate Partner Violence: Not on file      Review of systems: All other review of systems negative except as mentioned in the HPI.   Physical Exam: Vitals:   06/24/22 0832  BP: 110/80  Pulse: 86   Body mass index is 26.78 kg/m. Gen:      No acute distress HEENT:  sclera anicteric Abd:      soft, non-tender; no palpable masses, no distension Ext:    No edema Neuro: alert and oriented x 3 Psych: normal mood and affect  Data Reviewed:  Reviewed labs, radiology imaging, old records and pertinent past GI work up   Assessment and Plan/Recommendations:  51 year old very pleasant female with PTSD, chronic depression and anxiety disorder, history of GERD s/p Nissen fundoplication, s/p TAH, BSO and anterior posterior pelvic floor repair here for follow-up of chronic irritable bowel syndrome constipation   IBS constipation: Overall symptoms are better  Continue Linzess 72 mcg daily Continue with high-fiber diet and increase water intake   Symptomatic hemorrhoids: Use Analpram HC cream up to twice daily per rectum as needed.  Avoid using it continuously for greater than 5 to 7 days at a time.  Return as needed  This visit required >40 minutes of patient care (this includes precharting, chart review, review of results, face-to-face time used for counseling as well as treatment plan and follow-up. The patient was provided an opportunity to ask questions and all were answered. The patient agreed with the plan and demonstrated an understanding of the instructions.  Damaris Hippo , MD    CC: [name], MD

## 2022-06-28 DIAGNOSIS — F411 Generalized anxiety disorder: Secondary | ICD-10-CM | POA: Diagnosis not present

## 2022-06-28 DIAGNOSIS — F900 Attention-deficit hyperactivity disorder, predominantly inattentive type: Secondary | ICD-10-CM | POA: Diagnosis not present

## 2022-06-28 DIAGNOSIS — F4310 Post-traumatic stress disorder, unspecified: Secondary | ICD-10-CM | POA: Diagnosis not present

## 2022-06-28 DIAGNOSIS — F332 Major depressive disorder, recurrent severe without psychotic features: Secondary | ICD-10-CM | POA: Diagnosis not present

## 2022-08-01 ENCOUNTER — Other Ambulatory Visit: Payer: Self-pay | Admitting: Gastroenterology

## 2022-08-01 DIAGNOSIS — F4310 Post-traumatic stress disorder, unspecified: Secondary | ICD-10-CM | POA: Diagnosis not present

## 2022-08-08 DIAGNOSIS — F4310 Post-traumatic stress disorder, unspecified: Secondary | ICD-10-CM | POA: Diagnosis not present

## 2022-08-10 DIAGNOSIS — F900 Attention-deficit hyperactivity disorder, predominantly inattentive type: Secondary | ICD-10-CM | POA: Diagnosis not present

## 2022-08-10 DIAGNOSIS — F4310 Post-traumatic stress disorder, unspecified: Secondary | ICD-10-CM | POA: Diagnosis not present

## 2022-08-10 DIAGNOSIS — F332 Major depressive disorder, recurrent severe without psychotic features: Secondary | ICD-10-CM | POA: Diagnosis not present

## 2022-08-10 DIAGNOSIS — F411 Generalized anxiety disorder: Secondary | ICD-10-CM | POA: Diagnosis not present

## 2022-08-11 ENCOUNTER — Ambulatory Visit (INDEPENDENT_AMBULATORY_CARE_PROVIDER_SITE_OTHER): Payer: Federal, State, Local not specified - PPO | Admitting: *Deleted

## 2022-08-11 DIAGNOSIS — Z23 Encounter for immunization: Secondary | ICD-10-CM | POA: Diagnosis not present

## 2022-08-11 NOTE — Progress Notes (Signed)
Patient here for her flu shot. Given in right deltoid. Patient tolerated well.   Please co sign

## 2022-08-15 DIAGNOSIS — K080 Exfoliation of teeth due to systemic causes: Secondary | ICD-10-CM | POA: Diagnosis not present

## 2022-08-17 DIAGNOSIS — F4310 Post-traumatic stress disorder, unspecified: Secondary | ICD-10-CM | POA: Diagnosis not present

## 2022-08-23 ENCOUNTER — Encounter: Payer: Self-pay | Admitting: Gastroenterology

## 2022-08-23 DIAGNOSIS — F4323 Adjustment disorder with mixed anxiety and depressed mood: Secondary | ICD-10-CM | POA: Diagnosis not present

## 2022-08-25 ENCOUNTER — Encounter: Payer: Self-pay | Admitting: Internal Medicine

## 2022-08-29 DIAGNOSIS — G4719 Other hypersomnia: Secondary | ICD-10-CM | POA: Diagnosis not present

## 2022-08-29 DIAGNOSIS — F5104 Psychophysiologic insomnia: Secondary | ICD-10-CM | POA: Diagnosis not present

## 2022-08-31 DIAGNOSIS — R070 Pain in throat: Secondary | ICD-10-CM | POA: Diagnosis not present

## 2022-09-07 DIAGNOSIS — E559 Vitamin D deficiency, unspecified: Secondary | ICD-10-CM | POA: Diagnosis not present

## 2022-09-07 DIAGNOSIS — M0609 Rheumatoid arthritis without rheumatoid factor, multiple sites: Secondary | ICD-10-CM | POA: Diagnosis not present

## 2022-09-07 DIAGNOSIS — Z79899 Other long term (current) drug therapy: Secondary | ICD-10-CM | POA: Diagnosis not present

## 2022-09-07 DIAGNOSIS — M1990 Unspecified osteoarthritis, unspecified site: Secondary | ICD-10-CM | POA: Diagnosis not present

## 2022-09-09 DIAGNOSIS — M6289 Other specified disorders of muscle: Secondary | ICD-10-CM | POA: Diagnosis not present

## 2022-09-09 DIAGNOSIS — R6884 Jaw pain: Secondary | ICD-10-CM | POA: Diagnosis not present

## 2022-09-09 DIAGNOSIS — M542 Cervicalgia: Secondary | ICD-10-CM | POA: Diagnosis not present

## 2022-09-09 DIAGNOSIS — F4310 Post-traumatic stress disorder, unspecified: Secondary | ICD-10-CM | POA: Diagnosis not present

## 2022-09-12 DIAGNOSIS — M6289 Other specified disorders of muscle: Secondary | ICD-10-CM | POA: Diagnosis not present

## 2022-09-12 DIAGNOSIS — R6884 Jaw pain: Secondary | ICD-10-CM | POA: Diagnosis not present

## 2022-09-12 DIAGNOSIS — M542 Cervicalgia: Secondary | ICD-10-CM | POA: Diagnosis not present

## 2022-09-13 DIAGNOSIS — H532 Diplopia: Secondary | ICD-10-CM | POA: Diagnosis not present

## 2022-09-13 DIAGNOSIS — H04129 Dry eye syndrome of unspecified lacrimal gland: Secondary | ICD-10-CM | POA: Diagnosis not present

## 2022-09-14 DIAGNOSIS — G4733 Obstructive sleep apnea (adult) (pediatric): Secondary | ICD-10-CM | POA: Diagnosis not present

## 2022-09-15 DIAGNOSIS — R030 Elevated blood-pressure reading, without diagnosis of hypertension: Secondary | ICD-10-CM | POA: Diagnosis not present

## 2022-09-15 DIAGNOSIS — G4733 Obstructive sleep apnea (adult) (pediatric): Secondary | ICD-10-CM | POA: Diagnosis not present

## 2022-09-15 DIAGNOSIS — F5104 Psychophysiologic insomnia: Secondary | ICD-10-CM | POA: Diagnosis not present

## 2022-09-16 ENCOUNTER — Other Ambulatory Visit: Payer: Self-pay

## 2022-09-16 DIAGNOSIS — M542 Cervicalgia: Secondary | ICD-10-CM | POA: Diagnosis not present

## 2022-09-16 DIAGNOSIS — M6289 Other specified disorders of muscle: Secondary | ICD-10-CM | POA: Diagnosis not present

## 2022-09-16 DIAGNOSIS — R6884 Jaw pain: Secondary | ICD-10-CM | POA: Diagnosis not present

## 2022-09-16 MED ORDER — CLOTRIMAZOLE 10 MG MT TROC: 10.0000 mg | Freq: Four times a day (QID) | OROMUCOSAL | 0 refills | Status: DC | PRN

## 2022-09-19 DIAGNOSIS — M542 Cervicalgia: Secondary | ICD-10-CM | POA: Diagnosis not present

## 2022-09-19 DIAGNOSIS — R6884 Jaw pain: Secondary | ICD-10-CM | POA: Diagnosis not present

## 2022-09-19 DIAGNOSIS — M6289 Other specified disorders of muscle: Secondary | ICD-10-CM | POA: Diagnosis not present

## 2022-09-20 DIAGNOSIS — F4310 Post-traumatic stress disorder, unspecified: Secondary | ICD-10-CM | POA: Diagnosis not present

## 2022-09-21 DIAGNOSIS — Z79899 Other long term (current) drug therapy: Secondary | ICD-10-CM | POA: Diagnosis not present

## 2022-09-21 DIAGNOSIS — M6289 Other specified disorders of muscle: Secondary | ICD-10-CM | POA: Diagnosis not present

## 2022-09-21 DIAGNOSIS — R6884 Jaw pain: Secondary | ICD-10-CM | POA: Diagnosis not present

## 2022-09-21 DIAGNOSIS — M542 Cervicalgia: Secondary | ICD-10-CM | POA: Diagnosis not present

## 2022-09-28 DIAGNOSIS — F4310 Post-traumatic stress disorder, unspecified: Secondary | ICD-10-CM | POA: Diagnosis not present

## 2022-10-05 DIAGNOSIS — G4733 Obstructive sleep apnea (adult) (pediatric): Secondary | ICD-10-CM | POA: Diagnosis not present

## 2022-10-06 DIAGNOSIS — M0609 Rheumatoid arthritis without rheumatoid factor, multiple sites: Secondary | ICD-10-CM | POA: Diagnosis not present

## 2022-10-07 DIAGNOSIS — F4310 Post-traumatic stress disorder, unspecified: Secondary | ICD-10-CM | POA: Diagnosis not present

## 2022-10-14 DIAGNOSIS — F4310 Post-traumatic stress disorder, unspecified: Secondary | ICD-10-CM | POA: Diagnosis not present

## 2022-10-19 ENCOUNTER — Encounter: Payer: Self-pay | Admitting: Gastroenterology

## 2022-10-19 ENCOUNTER — Other Ambulatory Visit: Payer: Self-pay

## 2022-10-19 ENCOUNTER — Telehealth: Payer: Self-pay

## 2022-10-19 DIAGNOSIS — R140 Abdominal distension (gaseous): Secondary | ICD-10-CM

## 2022-10-19 DIAGNOSIS — R195 Other fecal abnormalities: Secondary | ICD-10-CM

## 2022-10-19 NOTE — Telephone Encounter (Signed)
It is hard to tell based on the pictures, it is fine to test stool O&P.

## 2022-10-19 NOTE — Telephone Encounter (Signed)
Spoke with the patient. She is concerned by what she has seen in her stools twice. She has concerns about the possibility of parasites. The patient has sent pictures through My Chart. She would like to know if she should be tested, if this that she has found is an intestinal worm, and if not, what could it be?

## 2022-10-19 NOTE — Telephone Encounter (Signed)
Patient advised. She agrees to this plan.

## 2022-10-20 ENCOUNTER — Other Ambulatory Visit: Payer: Federal, State, Local not specified - PPO

## 2022-10-25 ENCOUNTER — Other Ambulatory Visit: Payer: Federal, State, Local not specified - PPO

## 2022-10-26 ENCOUNTER — Other Ambulatory Visit: Payer: Federal, State, Local not specified - PPO

## 2022-10-26 DIAGNOSIS — R140 Abdominal distension (gaseous): Secondary | ICD-10-CM | POA: Diagnosis not present

## 2022-10-26 DIAGNOSIS — R195 Other fecal abnormalities: Secondary | ICD-10-CM | POA: Diagnosis not present

## 2022-10-27 LAB — OVA AND PARASITE EXAMINATION
CONCENTRATE RESULT:: NONE SEEN
MICRO NUMBER:: 14360561
SPECIMEN QUALITY:: ADEQUATE
TRICHROME RESULT:: NONE SEEN

## 2022-10-28 DIAGNOSIS — F4310 Post-traumatic stress disorder, unspecified: Secondary | ICD-10-CM | POA: Diagnosis not present

## 2022-11-05 DIAGNOSIS — G4733 Obstructive sleep apnea (adult) (pediatric): Secondary | ICD-10-CM | POA: Diagnosis not present

## 2022-11-11 DIAGNOSIS — F4310 Post-traumatic stress disorder, unspecified: Secondary | ICD-10-CM | POA: Diagnosis not present

## 2022-11-11 DIAGNOSIS — G4733 Obstructive sleep apnea (adult) (pediatric): Secondary | ICD-10-CM | POA: Diagnosis not present

## 2022-11-16 DIAGNOSIS — M791 Myalgia, unspecified site: Secondary | ICD-10-CM | POA: Diagnosis not present

## 2022-11-16 DIAGNOSIS — Z79899 Other long term (current) drug therapy: Secondary | ICD-10-CM | POA: Diagnosis not present

## 2022-11-16 DIAGNOSIS — M1990 Unspecified osteoarthritis, unspecified site: Secondary | ICD-10-CM | POA: Diagnosis not present

## 2022-11-16 DIAGNOSIS — E559 Vitamin D deficiency, unspecified: Secondary | ICD-10-CM | POA: Diagnosis not present

## 2022-11-18 DIAGNOSIS — F4310 Post-traumatic stress disorder, unspecified: Secondary | ICD-10-CM | POA: Diagnosis not present

## 2022-11-25 DIAGNOSIS — F4310 Post-traumatic stress disorder, unspecified: Secondary | ICD-10-CM | POA: Diagnosis not present

## 2022-12-02 DIAGNOSIS — F4310 Post-traumatic stress disorder, unspecified: Secondary | ICD-10-CM | POA: Diagnosis not present

## 2022-12-06 DIAGNOSIS — G4733 Obstructive sleep apnea (adult) (pediatric): Secondary | ICD-10-CM | POA: Diagnosis not present

## 2022-12-13 ENCOUNTER — Ambulatory Visit: Payer: Federal, State, Local not specified - PPO | Admitting: Internal Medicine

## 2022-12-16 DIAGNOSIS — F4310 Post-traumatic stress disorder, unspecified: Secondary | ICD-10-CM | POA: Diagnosis not present

## 2022-12-20 DIAGNOSIS — F5104 Psychophysiologic insomnia: Secondary | ICD-10-CM | POA: Diagnosis not present

## 2022-12-20 DIAGNOSIS — G4733 Obstructive sleep apnea (adult) (pediatric): Secondary | ICD-10-CM | POA: Diagnosis not present

## 2022-12-23 DIAGNOSIS — F4310 Post-traumatic stress disorder, unspecified: Secondary | ICD-10-CM | POA: Diagnosis not present

## 2022-12-27 DIAGNOSIS — F900 Attention-deficit hyperactivity disorder, predominantly inattentive type: Secondary | ICD-10-CM | POA: Diagnosis not present

## 2022-12-27 DIAGNOSIS — F411 Generalized anxiety disorder: Secondary | ICD-10-CM | POA: Diagnosis not present

## 2022-12-27 DIAGNOSIS — F4310 Post-traumatic stress disorder, unspecified: Secondary | ICD-10-CM | POA: Diagnosis not present

## 2022-12-27 DIAGNOSIS — F332 Major depressive disorder, recurrent severe without psychotic features: Secondary | ICD-10-CM | POA: Diagnosis not present

## 2023-01-04 DIAGNOSIS — G4733 Obstructive sleep apnea (adult) (pediatric): Secondary | ICD-10-CM | POA: Diagnosis not present

## 2023-01-04 DIAGNOSIS — F4310 Post-traumatic stress disorder, unspecified: Secondary | ICD-10-CM | POA: Diagnosis not present

## 2023-01-10 DIAGNOSIS — G4733 Obstructive sleep apnea (adult) (pediatric): Secondary | ICD-10-CM | POA: Diagnosis not present

## 2023-01-13 DIAGNOSIS — F4310 Post-traumatic stress disorder, unspecified: Secondary | ICD-10-CM | POA: Diagnosis not present

## 2023-01-19 DIAGNOSIS — K080 Exfoliation of teeth due to systemic causes: Secondary | ICD-10-CM | POA: Diagnosis not present

## 2023-01-20 DIAGNOSIS — F4310 Post-traumatic stress disorder, unspecified: Secondary | ICD-10-CM | POA: Diagnosis not present

## 2023-02-03 DIAGNOSIS — F4310 Post-traumatic stress disorder, unspecified: Secondary | ICD-10-CM | POA: Diagnosis not present

## 2023-02-10 DIAGNOSIS — F4310 Post-traumatic stress disorder, unspecified: Secondary | ICD-10-CM | POA: Diagnosis not present

## 2023-02-15 DIAGNOSIS — Z79899 Other long term (current) drug therapy: Secondary | ICD-10-CM | POA: Diagnosis not present

## 2023-02-15 DIAGNOSIS — M1990 Unspecified osteoarthritis, unspecified site: Secondary | ICD-10-CM | POA: Diagnosis not present

## 2023-02-15 DIAGNOSIS — M791 Myalgia, unspecified site: Secondary | ICD-10-CM | POA: Diagnosis not present

## 2023-02-15 DIAGNOSIS — M0609 Rheumatoid arthritis without rheumatoid factor, multiple sites: Secondary | ICD-10-CM | POA: Diagnosis not present

## 2023-02-24 DIAGNOSIS — F4310 Post-traumatic stress disorder, unspecified: Secondary | ICD-10-CM | POA: Diagnosis not present

## 2023-03-01 DIAGNOSIS — M0609 Rheumatoid arthritis without rheumatoid factor, multiple sites: Secondary | ICD-10-CM | POA: Diagnosis not present

## 2023-03-03 DIAGNOSIS — F4310 Post-traumatic stress disorder, unspecified: Secondary | ICD-10-CM | POA: Diagnosis not present

## 2023-03-08 DIAGNOSIS — M25562 Pain in left knee: Secondary | ICD-10-CM | POA: Diagnosis not present

## 2023-03-08 DIAGNOSIS — G5603 Carpal tunnel syndrome, bilateral upper limbs: Secondary | ICD-10-CM | POA: Diagnosis not present

## 2023-03-08 DIAGNOSIS — G5623 Lesion of ulnar nerve, bilateral upper limbs: Secondary | ICD-10-CM | POA: Diagnosis not present

## 2023-03-09 DIAGNOSIS — G5621 Lesion of ulnar nerve, right upper limb: Secondary | ICD-10-CM | POA: Diagnosis not present

## 2023-03-09 DIAGNOSIS — G5601 Carpal tunnel syndrome, right upper limb: Secondary | ICD-10-CM | POA: Diagnosis not present

## 2023-03-10 DIAGNOSIS — M25571 Pain in right ankle and joints of right foot: Secondary | ICD-10-CM | POA: Diagnosis not present

## 2023-03-10 DIAGNOSIS — M79671 Pain in right foot: Secondary | ICD-10-CM | POA: Diagnosis not present

## 2023-03-10 DIAGNOSIS — M25572 Pain in left ankle and joints of left foot: Secondary | ICD-10-CM | POA: Diagnosis not present

## 2023-03-10 DIAGNOSIS — M79672 Pain in left foot: Secondary | ICD-10-CM | POA: Diagnosis not present

## 2023-03-10 DIAGNOSIS — F4310 Post-traumatic stress disorder, unspecified: Secondary | ICD-10-CM | POA: Diagnosis not present

## 2023-03-15 DIAGNOSIS — L509 Urticaria, unspecified: Secondary | ICD-10-CM | POA: Diagnosis not present

## 2023-03-15 DIAGNOSIS — G5623 Lesion of ulnar nerve, bilateral upper limbs: Secondary | ICD-10-CM | POA: Diagnosis not present

## 2023-03-15 DIAGNOSIS — G5603 Carpal tunnel syndrome, bilateral upper limbs: Secondary | ICD-10-CM | POA: Diagnosis not present

## 2023-03-15 DIAGNOSIS — K219 Gastro-esophageal reflux disease without esophagitis: Secondary | ICD-10-CM | POA: Diagnosis not present

## 2023-03-15 DIAGNOSIS — J3089 Other allergic rhinitis: Secondary | ICD-10-CM | POA: Diagnosis not present

## 2023-03-15 DIAGNOSIS — J383 Other diseases of vocal cords: Secondary | ICD-10-CM | POA: Diagnosis not present

## 2023-03-15 DIAGNOSIS — G5601 Carpal tunnel syndrome, right upper limb: Secondary | ICD-10-CM | POA: Diagnosis not present

## 2023-03-17 DIAGNOSIS — F4310 Post-traumatic stress disorder, unspecified: Secondary | ICD-10-CM | POA: Diagnosis not present

## 2023-03-21 DIAGNOSIS — M170 Bilateral primary osteoarthritis of knee: Secondary | ICD-10-CM | POA: Diagnosis not present

## 2023-03-21 DIAGNOSIS — M7052 Other bursitis of knee, left knee: Secondary | ICD-10-CM | POA: Diagnosis not present

## 2023-03-21 DIAGNOSIS — M25562 Pain in left knee: Secondary | ICD-10-CM | POA: Diagnosis not present

## 2023-03-21 DIAGNOSIS — M7051 Other bursitis of knee, right knee: Secondary | ICD-10-CM | POA: Diagnosis not present

## 2023-03-29 DIAGNOSIS — M0609 Rheumatoid arthritis without rheumatoid factor, multiple sites: Secondary | ICD-10-CM | POA: Diagnosis not present

## 2023-03-31 DIAGNOSIS — F4310 Post-traumatic stress disorder, unspecified: Secondary | ICD-10-CM | POA: Diagnosis not present

## 2023-04-04 DIAGNOSIS — M170 Bilateral primary osteoarthritis of knee: Secondary | ICD-10-CM | POA: Diagnosis not present

## 2023-04-12 DIAGNOSIS — G4733 Obstructive sleep apnea (adult) (pediatric): Secondary | ICD-10-CM | POA: Diagnosis not present

## 2023-04-14 DIAGNOSIS — F4310 Post-traumatic stress disorder, unspecified: Secondary | ICD-10-CM | POA: Diagnosis not present

## 2023-04-19 DIAGNOSIS — M7661 Achilles tendinitis, right leg: Secondary | ICD-10-CM | POA: Diagnosis not present

## 2023-04-19 DIAGNOSIS — M170 Bilateral primary osteoarthritis of knee: Secondary | ICD-10-CM | POA: Diagnosis not present

## 2023-04-21 DIAGNOSIS — M791 Myalgia, unspecified site: Secondary | ICD-10-CM | POA: Diagnosis not present

## 2023-04-21 DIAGNOSIS — M0609 Rheumatoid arthritis without rheumatoid factor, multiple sites: Secondary | ICD-10-CM | POA: Diagnosis not present

## 2023-04-21 DIAGNOSIS — M1990 Unspecified osteoarthritis, unspecified site: Secondary | ICD-10-CM | POA: Diagnosis not present

## 2023-04-21 DIAGNOSIS — F4310 Post-traumatic stress disorder, unspecified: Secondary | ICD-10-CM | POA: Diagnosis not present

## 2023-04-21 DIAGNOSIS — Z79899 Other long term (current) drug therapy: Secondary | ICD-10-CM | POA: Diagnosis not present

## 2023-04-24 DIAGNOSIS — M7661 Achilles tendinitis, right leg: Secondary | ICD-10-CM | POA: Diagnosis not present

## 2023-04-24 DIAGNOSIS — M1711 Unilateral primary osteoarthritis, right knee: Secondary | ICD-10-CM | POA: Diagnosis not present

## 2023-04-24 DIAGNOSIS — M25562 Pain in left knee: Secondary | ICD-10-CM | POA: Diagnosis not present

## 2023-04-26 DIAGNOSIS — M170 Bilateral primary osteoarthritis of knee: Secondary | ICD-10-CM | POA: Diagnosis not present

## 2023-04-26 DIAGNOSIS — M7661 Achilles tendinitis, right leg: Secondary | ICD-10-CM | POA: Diagnosis not present

## 2023-04-28 DIAGNOSIS — F4310 Post-traumatic stress disorder, unspecified: Secondary | ICD-10-CM | POA: Diagnosis not present

## 2023-05-01 DIAGNOSIS — M7661 Achilles tendinitis, right leg: Secondary | ICD-10-CM | POA: Diagnosis not present

## 2023-05-01 DIAGNOSIS — M170 Bilateral primary osteoarthritis of knee: Secondary | ICD-10-CM | POA: Diagnosis not present

## 2023-05-05 DIAGNOSIS — F4310 Post-traumatic stress disorder, unspecified: Secondary | ICD-10-CM | POA: Diagnosis not present

## 2023-05-12 DIAGNOSIS — F4310 Post-traumatic stress disorder, unspecified: Secondary | ICD-10-CM | POA: Diagnosis not present

## 2023-05-17 DIAGNOSIS — F4310 Post-traumatic stress disorder, unspecified: Secondary | ICD-10-CM | POA: Diagnosis not present

## 2023-05-17 DIAGNOSIS — M791 Myalgia, unspecified site: Secondary | ICD-10-CM | POA: Diagnosis not present

## 2023-05-17 DIAGNOSIS — F411 Generalized anxiety disorder: Secondary | ICD-10-CM | POA: Diagnosis not present

## 2023-05-17 DIAGNOSIS — M1990 Unspecified osteoarthritis, unspecified site: Secondary | ICD-10-CM | POA: Diagnosis not present

## 2023-05-17 DIAGNOSIS — M0609 Rheumatoid arthritis without rheumatoid factor, multiple sites: Secondary | ICD-10-CM | POA: Diagnosis not present

## 2023-05-17 DIAGNOSIS — F900 Attention-deficit hyperactivity disorder, predominantly inattentive type: Secondary | ICD-10-CM | POA: Diagnosis not present

## 2023-05-17 DIAGNOSIS — Z79899 Other long term (current) drug therapy: Secondary | ICD-10-CM | POA: Diagnosis not present

## 2023-05-17 DIAGNOSIS — F332 Major depressive disorder, recurrent severe without psychotic features: Secondary | ICD-10-CM | POA: Diagnosis not present

## 2023-05-19 DIAGNOSIS — F4310 Post-traumatic stress disorder, unspecified: Secondary | ICD-10-CM | POA: Diagnosis not present

## 2023-05-24 DIAGNOSIS — M0609 Rheumatoid arthritis without rheumatoid factor, multiple sites: Secondary | ICD-10-CM | POA: Diagnosis not present

## 2023-05-26 DIAGNOSIS — F4310 Post-traumatic stress disorder, unspecified: Secondary | ICD-10-CM | POA: Diagnosis not present

## 2023-05-31 ENCOUNTER — Encounter (INDEPENDENT_AMBULATORY_CARE_PROVIDER_SITE_OTHER): Payer: Self-pay

## 2023-06-02 DIAGNOSIS — F4310 Post-traumatic stress disorder, unspecified: Secondary | ICD-10-CM | POA: Diagnosis not present

## 2023-06-07 DIAGNOSIS — M0609 Rheumatoid arthritis without rheumatoid factor, multiple sites: Secondary | ICD-10-CM | POA: Diagnosis not present

## 2023-06-08 DIAGNOSIS — Z6828 Body mass index (BMI) 28.0-28.9, adult: Secondary | ICD-10-CM | POA: Diagnosis not present

## 2023-06-08 DIAGNOSIS — Z1231 Encounter for screening mammogram for malignant neoplasm of breast: Secondary | ICD-10-CM | POA: Diagnosis not present

## 2023-06-08 DIAGNOSIS — Z01419 Encounter for gynecological examination (general) (routine) without abnormal findings: Secondary | ICD-10-CM | POA: Diagnosis not present

## 2023-06-08 LAB — HM MAMMOGRAPHY

## 2023-06-15 ENCOUNTER — Ambulatory Visit: Payer: Federal, State, Local not specified - PPO | Admitting: Neurology

## 2023-06-15 ENCOUNTER — Encounter: Payer: Self-pay | Admitting: Neurology

## 2023-06-15 VITALS — BP 153/104 | HR 99 | Ht 67.0 in | Wt 180.0 lb

## 2023-06-15 DIAGNOSIS — R208 Other disturbances of skin sensation: Secondary | ICD-10-CM

## 2023-06-15 DIAGNOSIS — M2550 Pain in unspecified joint: Secondary | ICD-10-CM | POA: Diagnosis not present

## 2023-06-15 DIAGNOSIS — F418 Other specified anxiety disorders: Secondary | ICD-10-CM | POA: Diagnosis not present

## 2023-06-15 DIAGNOSIS — M0600 Rheumatoid arthritis without rheumatoid factor, unspecified site: Secondary | ICD-10-CM

## 2023-06-15 MED ORDER — LAMOTRIGINE 25 MG PO TABS: ORAL_TABLET | ORAL | 5 refills | Status: DC

## 2023-06-15 NOTE — Progress Notes (Signed)
[facility]  PATIENT: Pamela Novak DOB: [birth year]  REFERRING DOCTOR OR PCP: Cheryll Cockayne, MD; Toni Arthurs, MD SOURCE: Patient, notes from primary care, lab results,  _________________________________   HISTORICAL  CHIEF COMPLAINT:  Chief Complaint  Patient presents with   New Patient (Initial Visit)    Pt in room 11. New patient here for neuropathy. Pt reports pain knee and ankles, numbness in toes, tingling,swelling.    HISTORY OF PRESENT ILLNESS:  I had the pleasure of seeing your patient, Pamela Novak, at [facility] for neurologic consultation regarding her dysesthesias, myalgias and joint pain.  She is a 52 yo woman who was diagnosed with seronegative RA in 2022.  She is on leflunomide and just started Cimzia.   She had briefly been on methotrexate.  She noted that when the leflunomide dose was cut in half due to low WBC,  that the pain in her legs started.     The worse pain is wrist and elbow but she also feel pain in her muscles,other joints and skin.  She notes allodynia in her skin.  Different parts of her skin seem more affected at different times.  Pain in her heel is worse with movements a certain way.   She saw Orthopedics and ankle pain was felt to possibly be Achilles tendon but PT did not help.  This pain improved with leflunomide.   She is on low dose prednisone and felt pain was better.  She was on gabepentin and felt it helped some of her pain better.  She ison duloxetine and ithelped back pain in past  She wa started on HRT by gynecologists last week.  She sees psychiatry for anxiety.    She feels better since HRT patch started.   She notes a history of white coat syndrome.    Studies: She had a NCV/EMG at [facility] (Dr. Bufford Buttner) 03/08/2023-- The report showed moderate right CTS and mild right ulnar  neuropathy at the elbow  Labs: Hemoglobin A1c has been within normal limits several times over the last few  years  REVIEW OF SYSTEMS: Constitutional: No fevers, chills, sweats, or change in appetite Eyes: No visual changes, double vision, eye pain Ear, nose and throat: No hearing loss, ear pain, nasal congestion, sore throat Cardiovascular: No chest pain, palpitations Respiratory:  No shortness of breath at rest or with exertion.   No wheezes GastrointestinaI: No nausea, vomiting, diarrhea, abdominal pain, fecal incontinence Genitourinary:  No dysuria, urinary retention or frequency.  No nocturia. Musculoskeletal: Multiple joint pain.   Integumentary: No rash, pruritus, skin lesions Neurological: as above Psychiatric: Notes anxiety Endocrine: No palpitations, diaphoresis, change in appetite, change in weigh or increased thirst Hematologic/Lymphatic:  No anemia, purpura, petechiae. Allergic/Immunologic: No itchy/runny eyes, nasal congestion, recent allergic reactions, rashes  ALLERGIES: Allergies  Allergen Reactions   Capsaicin Cough, Other (See Comments), Diarrhea and Nausea Only        Piper Other (See Comments)   Black Pepper-Turmeric Other (See Comments)   Methotrexate    Nsaids     Stomach pain   Other Other (See Comments)    ANY PEPPER - flu like symptoms     Rosuvastatin Other (See Comments)   Atorvastatin Other (See Comments)    Muscle, joint pain   Flagyl [Metronidazole] Rash   Prednisone Anxiety and Other (See Comments)    HOME MEDICATIONS:  Current Outpatient Medications:    Acetaminophen 500 MG capsule, , Disp: , Rfl:    amphetamine-dextroamphetamine (ADDERALL)  20 MG tablet, TAKE 1 TABLET BY MOUTH EVERY AM AND ONE TABLET IN AFTERNOON TO EXTEND EFFECT OF THE VYVANSE (Patient not taking: Reported on 06/24/2022), Disp: , Rfl:    baclofen (LIORESAL) 10 MG tablet, , Disp: , Rfl:    cloNIDine (CATAPRES - DOSED IN MG/24 HR) 0.2 mg/24hr patch, , Disp: , Rfl:    clotrimazole (MYCELEX) 10 MG troche, Take 1 tablet (10 mg total) by mouth 4 (four) times daily as needed., Disp: 70  Troche, Rfl: 0   cyclobenzaprine (FLEXERIL) 5 MG tablet, , Disp: , Rfl:    dexamethasone (DECADRON) 2 MG tablet, , Disp: , Rfl:    DULoxetine (CYMBALTA) 60 MG capsule, , Disp: , Rfl:    EPINEPHrine 0.3 mg/0.3 mL IJ SOAJ injection, Inject 0.3 mg into the muscle as needed for anaphylaxis., Disp: , Rfl:    estradiol (ESTRACE) 0.1 MG/GM vaginal cream, SMARTSIG:0.5 Gram(s) Vaginal 2-3 Times Weekly, Disp: , Rfl:    hydrocortisone (ANUSOL-HC) 25 MG suppository, , Disp: , Rfl:    ibuprofen (ADVIL) 600 MG tablet, Take 600 mg by mouth every 6 (six) hours as needed., Disp: , Rfl:    leflunomide (ARAVA) 20 MG tablet, Take 20 mg by mouth daily., Disp: , Rfl:    LINZESS 72 MCG capsule, Take 1 capsule (72 mcg total) by mouth daily., Disp: 90 capsule, Rfl: 3   LORazepam (ATIVAN) 1 MG tablet, , Disp: , Rfl:    meloxicam (MOBIC) 15 MG tablet, , Disp: , Rfl:    ondansetron (ZOFRAN-ODT) 4 MG disintegrating tablet, , Disp: , Rfl:    pramoxine-hydrocortisone (ANALPRAM HC) cream, Apply topically daily as needed., Disp: 30 g, Rfl: 1   traZODone (DESYREL) 50 MG tablet, Take 50-100 mg by mouth at bedtime., Disp: , Rfl:    triamcinolone cream (KENALOG) 0.1 %, Apply 1 application topically 2 (two) times daily as needed., Disp: , Rfl:    Vitamin D, Ergocalciferol, (DRISDOL) 1.25 MG (50000 UNIT) CAPS capsule, Take 50,000 Units by mouth once a week., Disp: , Rfl:    zolmitriptan (ZOMIG) 5 MG nasal solution, PLACE 1 SPRAY INTO THE NOSE AS NEEDED FOR MIGRAINE., Disp: 6 each, Rfl: 5  PAST MEDICAL HISTORY: Past Medical History:  Diagnosis Date   ADHD    Allergy    Anxiety    severe   Arthritis    Bruxism (teeth grinding) 06/23/2021   WEARS  NIGHT GUARD WILL BRING DOS   Complication of anesthesia    "woke up during colonoscopy" age 37 years old   Constipation    Depression    Diverticulosis    Folliculitis 06/23/2021   ACNE PER PT   GERD (gastroesophageal reflux disease)    occ hiccup   Hemorrhoids    History of  colon polyps    Hyperlipidemia    IBS (irritable bowel syndrome)    Migraines    Pre-diabetes    PTSD (post-traumatic stress disorder)    severe per psychiatry note dr Phillip Heal 06-18-2021   Restless legs syndrome (RLS)    Sciatica    Right side   Varicose veins of lower extremity    in vagina also and both legs   Wears glasses 06/23/2021    PAST SURGICAL HISTORY: Past Surgical History:  Procedure Laterality Date   66 HOUR PH STUDY N/A 04/01/2019   Procedure: 24 HOUR PH STUDY;  Surgeon: Napoleon Form, MD;  Location: [facility];  Service: Endoscopy;  Laterality: N/A;   ABLATION ON ENDOMETRIOSIS  10/04/2012   Procedure: ABLATION ON ENDOMETRIOSIS;  Surgeon: Mitchel Honour, DO;  Location: [facility];  Service: Gynecology;  Laterality: N/A;   ANTERIOR AND POSTERIOR REPAIR WITH SACROSPINOUS FIXATION N/A 06/29/2021   Procedure: ANTERIOR AND POSTERIOR REPAIR;  Surgeon: Mitchel Honour, DO;  Location: [facility];  Service: Gynecology;  Laterality: N/A;   bravo test  03/11/2021   COLONOSCOPY  2019   49yr f/u   CONDYLOMA EXCISION/FULGURATION  10/04/2012   Procedure: CONDYLOMA REMOVAL;  Surgeon: Mitchel Honour, DO;  Location: [facility];  Service: Gynecology;  Laterality: N/A;   CYSTOSCOPY N/A 06/29/2021   Procedure: CYSTOSCOPY;  Surgeon: Mitchel Honour, DO;  Location: [facility];  Service: Gynecology;  Laterality: N/A;   DILITATION & CURRETTAGE/HYSTROSCOPY WITH NOVASURE ABLATION  10/04/2012   Procedure: DILATATION & CURETTAGE/HYSTEROSCOPY WITH NOVASURE ABLATION;  Surgeon: Mitchel Honour, DO;  Location: [facility];  Service: Gynecology;  Laterality: N/A;   ESOPHAGEAL MANOMETRY N/A 04/01/2019   Procedure: ESOPHAGEAL MANOMETRY (EM);  Surgeon: Napoleon Form, MD;  Location: [facility];  Service: Endoscopy;  Laterality: N/A;   HEMORRHOID BANDING     multiple times yrs ago   LAPAROSCOPIC NISSEN FUNDOPLICATION N/A 09/05/2019   Procedure: LAPAROSCOPIC HIATAL HERNIA REPAIR  AND NISSEN FUNDOPLICATION;  Surgeon: Gaynelle Adu, MD;  Location: [facility];  Service: General;  Laterality: N/A;   LAPAROSCOPIC VAGINAL HYSTERECTOMY WITH SALPINGECTOMY Bilateral 06/29/2021   Procedure: LAPAROSCOPIC ASSISTED VAGINAL HYSTERECTOMY WITH BIILATERAL SALPINGECTOMY; CAUTERY OF VULVAR HEMANGIOMAS AND EXCISION OF LESION AT INTROITUS;  Surgeon: Mitchel Honour, DO;  Location: [facility];  Service: Gynecology;  Laterality: Bilateral;   LAPAROSCOPY  10/04/2012   Procedure: LAPAROSCOPY OPERATIVE;  Surgeon: Mitchel Honour, DO;  Location: [facility];  Service: Gynecology;  Laterality: N/A;   MASS EXCISION Bilateral 09/05/2019   Procedure: EXCISION BILATERAL SOFT TISSUE MASSES, THIGH;  Surgeon: Gaynelle Adu, MD;  Location: [facility];  Service: General;  Laterality: Bilateral;   RADIOFREQUENCY ABLATION NERVES     sciatic nerve last 3 to 4 vertebrate lower and spine and sciatic nerve both legs   UPPER GI ENDOSCOPY  04/01/2019    FAMILY HISTORY: Family History  Problem Relation Age of Onset   Heart disease Mother    Arthritis Mother    Hyperlipidemia Mother    Hypertension Mother    Heart attack Mother    Colon polyps Father    Hashimoto's thyroiditis Sister    Diabetes Sister    Thyroid disease Sister    Colon cancer Paternal Grandfather    Thyroid disease Sister     SOCIAL HISTORY: Social History   Socioeconomic History   Marital status: Married    Spouse name: [name]   Number of children: 2   Years of education: 12+   Highest education level: Not on file  Occupational History   Occupation: adm Teaching laboratory technician: [employer]  Tobacco Use   Smoking status: Never   Smokeless tobacco: Never  Vaping Use   Vaping status: Never Used  Substance and Sexual Activity   Alcohol use: Not Currently   Drug use: No   Sexual activity: Yes  Other Topics Concern   Not on file  Social History Narrative   Regular exercise-no   Caffeine Use-yes   Social Determinants of  Health   Financial Resource Strain: Not on file  Food Insecurity: Not on file  Transportation Needs: Not on file  Physical Activity: Not on file  Stress: Not on file  Social  Connections: Not on file  Intimate Partner Violence: Not on file       PHYSICAL EXAM  Vitals:   06/15/23 1029  Weight: 180 lb (81.6 kg)  Height: 5\' 7"  (1.702 m)    Body mass index is 28.19 kg/m.   General: The patient is well-developed and well-nourished and in no acute distress  HEENT:  Head is [place]/AT.  Sclera are anicteric.   Neck: No carotid bruits are noted.  The neck is just mildly tender in the paraspinal muscles.  Cardiovascular: The heart has a regular rate and rhythm with a normal S1 and S2. There were no murmurs, gallops or rubs.    Skin: Extremities are without rash or  edema.  Musculoskeletal: She has mild tenderness in the upper chest muscles.  No tenderness or warmth over joints.  Neurologic Exam  Mental status: The patient is alert and oriented x 3 at the time of the examination. The patient has apparent normal recent and remote memory, with an apparently normal attention span and concentration ability.   Speech is normal.  Cranial nerves: Extraocular movements are full. Pupils are equal, round, and reactive to light and accomodation.  Visual fields are full.  Facial symmetry is present. There is good facial sensation to soft touch bilaterally.Facial strength is normal.  Trapezius and sternocleidomastoid strength is normal. No dysarthria is noted.  The tongue is midline, and the patient has symmetric elevation of the soft palate. No obvious hearing deficits are noted.  Motor:  Muscle bulk is normal.   Tone is normal. Strength is  5 / 5 in all 4 extremities.   Sensory: Sensory testing is normal in all 4 extremities to vibration and pinprick  Coordination: Cerebellar testing reveals good finger-nose-finger and heel-to-shin bilaterally.  Gait and station: Station is normal.   Gait is  normal. Tandem gait is slightly wide. Romberg is negative.   Reflexes: Deep tendon reflexes are symmetric and normal bilaterally.   Plantar responses are flexor.    DIAGNOSTIC DATA (LABS, IMAGING, TESTING) - I reviewed patient records, labs, notes, testing and imaging myself where available.  Lab Results  Component Value Date   WBC 7.9 06/21/2022   HGB 13.3 06/21/2022   HCT 39.5 06/21/2022   MCV 86.9 06/21/2022   PLT 238.0 06/21/2022      Component Value Date/Time   NA 139 06/21/2022 0910   K 4.4 06/21/2022 0910   CL 102 06/21/2022 0910   CO2 29 06/21/2022 0910   GLUCOSE 113 (H) 06/21/2022 0910   BUN 16 06/21/2022 0910   CREATININE 0.84 06/21/2022 0910   CREATININE 1.04 05/26/2020 1136   CALCIUM 9.2 06/21/2022 0910   PROT 6.3 06/21/2022 0910   ALBUMIN 4.1 06/21/2022 0910   AST 14 06/21/2022 0910   ALT 14 06/21/2022 0910   ALKPHOS 59 06/21/2022 0910   BILITOT 0.4 06/21/2022 0910   GFRNONAA 59 (L) 06/30/2021 0621   GFRAA >60 09/06/2019 0257   Lab Results  Component Value Date   CHOL 285 (H) 06/21/2022   HDL 64.20 06/21/2022   LDLCALC 189 (H) 06/21/2022   LDLDIRECT 114.0 01/18/2022   TRIG 157.0 (H) 06/21/2022   CHOLHDL 4 06/21/2022   Lab Results  Component Value Date   HGBA1C 6.1 06/21/2022   No results found for: "VITAMINB12" Lab Results  Component Value Date   TSH 0.38 06/21/2022       ASSESSMENT AND PLAN  Dysesthesia - Plan: Multiple Myeloma Panel (SPEP&IFE w/QIG)  Allodynia  Depression with  anxiety  Seronegative rheumatoid arthritis (HCC)  Multiple joint pain - Plan: Multiple Myeloma Panel (SPEP&IFE w/QIG)   In summary, Ms. Guilliams is a 52 year old woman who reports joint pain, myalgias and dysesthesias in the limbs.  She does not have evidence of any significant polyneuropathy on her examination.  Some people with rheumatoid arthritis do develop a small fiber polyneuropathy.  However, she did not have any evidence of polyneuropathy on her  examination.  I will go ahead and check SPEP/IEF to look at other autoimmune close.  Since she has subjective pain including dysesthesia and allodynia, I will have her try lamotrigine and titrate up to 50 mg p.o. twice daily.  She had not tolerated gabapentin well in the past.  She is already on duloxetine.  The lamotrigine could also help as a mood stabilizer.  She already had a nerve conduction study of the arms showing moderate right carpal tunnel syndrome and mild right ulnar neuropathy.  Carpal tunnel release could be considered if symptoms worsen.  There was no evidence of polyneuropathy on that exam.  We discussed that if she tolerates the dose going up to 50 mg twice a day that she could call and we could increase the dose.  I did not schedule follow-up but could see her back if she has significant new or worsening neurologic symptoms.  Thank you for asked me to see Ms. Quaranta.  Please let me know if I can be of further assistance with her or other patients in the future.   A. Epimenio Foot, MD, [facility] 06/15/2023, 10:37 AM Certified in Neurology, Clinical Neurophysiology, Sleep Medicine and Neuroimaging  [facility] [address] [place], [place] [address] [phone]

## 2023-06-15 NOTE — Patient Instructions (Signed)
The pharmacy has the prescription of lamotrigine 25 mg. Please take it as follows: For 1 week take 1 pill once a day. The second week, take 1 pill twice a day. The third week, take 1 pill in the morning and 2 at bedtime or evening. The fourth week take 2 pills twice a day.  Most people tolerate lamotrigine very well. Some will get a rash.  If you do get a significant rash stop the medicine immediately and let us know.  

## 2023-06-16 DIAGNOSIS — F4310 Post-traumatic stress disorder, unspecified: Secondary | ICD-10-CM | POA: Diagnosis not present

## 2023-06-17 ENCOUNTER — Encounter: Payer: Self-pay | Admitting: Internal Medicine

## 2023-06-17 DIAGNOSIS — G5600 Carpal tunnel syndrome, unspecified upper limb: Secondary | ICD-10-CM | POA: Insufficient documentation

## 2023-06-20 DIAGNOSIS — G4733 Obstructive sleep apnea (adult) (pediatric): Secondary | ICD-10-CM | POA: Diagnosis not present

## 2023-06-20 LAB — MULTIPLE MYELOMA PANEL, SERUM
Albumin SerPl Elph-Mcnc: 3.8 g/dL (ref 2.9–4.4)
Albumin/Glob SerPl: 1.5 (ref 0.7–1.7)
Alpha 1: 0.2 g/dL (ref 0.0–0.4)
Alpha2 Glob SerPl Elph-Mcnc: 0.8 g/dL (ref 0.4–1.0)
B-Globulin SerPl Elph-Mcnc: 1.2 g/dL (ref 0.7–1.3)
Gamma Glob SerPl Elph-Mcnc: 0.5 g/dL (ref 0.4–1.8)
Globulin, Total: 2.7 g/dL (ref 2.2–3.9)
IgA/Immunoglobulin A, Serum: 52 mg/dL — ABNORMAL LOW (ref 87–352)
IgG (Immunoglobin G), Serum: 444 mg/dL — ABNORMAL LOW (ref 586–1602)
IgM (Immunoglobulin M), Srm: 64 mg/dL (ref 26–217)
Total Protein: 6.5 g/dL (ref 6.0–8.5)

## 2023-06-21 DIAGNOSIS — M0609 Rheumatoid arthritis without rheumatoid factor, multiple sites: Secondary | ICD-10-CM | POA: Diagnosis not present

## 2023-06-22 ENCOUNTER — Encounter: Payer: Self-pay | Admitting: Internal Medicine

## 2023-06-22 DIAGNOSIS — F4310 Post-traumatic stress disorder, unspecified: Secondary | ICD-10-CM | POA: Diagnosis not present

## 2023-06-22 NOTE — Patient Instructions (Addendum)
Gyn - Pamela Sorrow, NP Obstetrics/Gynecology   Blood work was ordered.   The lab is on the first floor.    Medications changes include :   none     Return in about 6 months (around 12/24/2023) for follow up.    Health Maintenance, Female Adopting a healthy lifestyle and getting preventive care are important in promoting health and wellness. Ask your health care provider about: The right schedule for you to have regular tests and exams. Things you can do on your own to prevent diseases and keep yourself healthy. What should I know about diet, weight, and exercise? Eat a healthy diet  Eat a diet that includes plenty of vegetables, fruits, low-fat dairy products, and lean protein. Do not eat a lot of foods that are high in solid fats, added sugars, or sodium. Maintain a healthy weight Body mass index (BMI) is used to identify weight problems. It estimates body fat based on height and weight. Your health care provider can help determine your BMI and help you achieve or maintain a healthy weight. Get regular exercise Get regular exercise. This is one of the most important things you can do for your health. Most adults should: Exercise for at least 150 minutes each week. The exercise should increase your heart rate and make you sweat (moderate-intensity exercise). Do strengthening exercises at least twice a week. This is in addition to the moderate-intensity exercise. Spend less time sitting. Even light physical activity can be beneficial. Watch cholesterol and blood lipids Have your blood tested for lipids and cholesterol at 54 years of age, then have this test every 5 years. Have your cholesterol levels checked more often if: Your lipid or cholesterol levels are high. You are older than 54 years of age. You are at high risk for heart disease. What should I know about cancer screening? Depending on your health history and family history, you may need to have cancer  screening at various ages. This may include screening for: Breast cancer. Cervical cancer. Colorectal cancer. Skin cancer. Lung cancer. What should I know about heart disease, diabetes, and high blood pressure? Blood pressure and heart disease High blood pressure causes heart disease and increases the risk of stroke. This is more likely to develop in people who have high blood pressure readings or are overweight. Have your blood pressure checked: Every 3-5 years if you are 16-58 years of age. Every year if you are 55 years old or older. Diabetes Have regular diabetes screenings. This checks your fasting blood sugar level. Have the screening done: Once every three years after age 71 if you are at a normal weight and have a low risk for diabetes. More often and at a younger age if you are overweight or have a high risk for diabetes. What should I know about preventing infection? Hepatitis B If you have a higher risk for hepatitis B, you should be screened for this virus. Talk with your health care provider to find out if you are at risk for hepatitis B infection. Hepatitis C Testing is recommended for: Everyone born from [birth year] through 1967. Anyone with known risk factors for hepatitis C. Sexually transmitted infections (STIs) Get screened for STIs, including gonorrhea and chlamydia, if: You are sexually active and are younger than 54 years of age. You are older than 54 years of age and your health care provider tells you that you are at risk for this type of infection. Your sexual activity has changed since you  were last screened, and you are at increased risk for chlamydia or gonorrhea. Ask your health care provider if you are at risk. Ask your health care provider about whether you are at high risk for HIV. Your health care provider may recommend a prescription medicine to help prevent HIV infection. If you choose to take medicine to prevent HIV, you should first get tested for HIV. You  should then be tested every 3 months for as long as you are taking the medicine. Pregnancy If you are about to stop having your period (premenopausal) and you may become pregnant, seek counseling before you get pregnant. Take 400 to 800 micrograms (mcg) of folic acid every day if you become pregnant. Ask for birth control (contraception) if you want to prevent pregnancy. Osteoporosis and menopause Osteoporosis is a disease in which the bones lose minerals and strength with aging. This can result in bone fractures. If you are 65 years old or older, or if you are at risk for osteoporosis and fractures, ask your health care provider if you should: Be screened for bone loss. Take a calcium or vitamin D supplement to lower your risk of fractures. Be given hormone replacement therapy (HRT) to treat symptoms of menopause. Follow these instructions at home: Alcohol use Do not drink alcohol if: Your health care provider tells you not to drink. You are pregnant, may be pregnant, or are planning to become pregnant. If you drink alcohol: Limit how much you have to: 0-1 drink a day. Know how much alcohol is in your drink. In the [place] one drink equals one 12 oz bottle of beer (355 mL), one 5 oz glass of wine (148 mL), or one 1 oz glass of hard liquor (44 mL). Lifestyle Do not use any products that contain nicotine or tobacco. These products include cigarettes, chewing tobacco, and vaping devices, such as e-cigarettes. If you need help quitting, ask your health care provider. Do not use street drugs. Do not share needles. Ask your health care provider for help if you need support or information about quitting drugs. General instructions Schedule regular health, dental, and eye exams. Stay current with your vaccines. Tell your health care provider if: You often feel depressed. You have ever been abused or do not feel safe at home. Summary Adopting a healthy lifestyle and getting preventive care are  important in promoting health and wellness. Follow your health care provider's instructions about healthy diet, exercising, and getting tested or screened for diseases. Follow your health care provider's instructions on monitoring your cholesterol and blood pressure. This information is not intended to replace advice given to you by your health care provider. Make sure you discuss any questions you have with your health care provider. Document Revised: 03/08/2021 Document Reviewed: 03/08/2021 Elsevier Patient Education  2024 [employer].

## 2023-06-22 NOTE — Progress Notes (Signed)
Subjective:    Patient ID: Pamela Novak, female    DOB: 03/19/1971, 52 y.o.   MRN: [id number]      HPI Channel is here for a Physical exam and her chronic medical problems.   RA not ideally controlled - meds being adjusted  Just started on HRT - feeling better.     Medications and allergies reviewed with patient and updated if appropriate.  Current Outpatient Medications on File Prior to Visit  Medication Sig Dispense Refill   clotrimazole (MYCELEX) 10 MG troche Take 1 tablet (10 mg total) by mouth 4 (four) times daily as needed. 70 Troche 0   cyclobenzaprine (FLEXERIL) 5 MG tablet      dexamethasone (DECADRON) 2 MG tablet      DULoxetine (CYMBALTA) 60 MG capsule      EPINEPHrine 0.3 mg/0.3 mL IJ SOAJ injection Inject 0.3 mg into the muscle as needed for anaphylaxis.     lamoTRIgine (LAMICTAL) 25 MG tablet Take one pill daily x 1 week, then one pill twice daily x 1 week, then one pill three times daily x 1 week, then 2 pills twice daily x 1 week, then start other prescription 120 tablet 5   leflunomide (ARAVA) 20 MG tablet Take 20 mg by mouth daily.     LORazepam (ATIVAN) 1 MG tablet      meloxicam (MOBIC) 15 MG tablet      ondansetron (ZOFRAN-ODT) 4 MG disintegrating tablet      zolmitriptan (ZOMIG) 5 MG nasal solution PLACE 1 SPRAY INTO THE NOSE AS NEEDED FOR MIGRAINE. 6 each 5   No current facility-administered medications on file prior to visit.    Review of Systems  Constitutional:  Negative for fever.  Eyes:  Positive for visual disturbance (blurry vision).  Respiratory:  Positive for cough (mild x couple of days). Negative for shortness of breath and wheezing.   Cardiovascular:  Positive for chest pain (non exertional - maybe anxiety). Negative for palpitations and leg swelling.  Gastrointestinal:  Negative for abdominal pain, blood in stool, constipation and diarrhea.       Some gerd  Genitourinary:  Negative for dysuria.  Musculoskeletal:  Positive for  arthralgias.  Skin:  Negative for rash.  Neurological:  Positive for headaches. Negative for light-headedness.  Psychiatric/Behavioral:  Positive for dysphoric mood. The patient is nervous/anxious.        Objective:   Vitals:   06/23/23 0753  BP: (!) 140/90  Pulse: 79  Temp: 98.3 F (36.8 C)  SpO2: 97%   Filed Weights   06/23/23 0753  Weight: 184 lb (83.5 kg)   Body mass index is 28.82 kg/m.  BP Readings from Last 3 Encounters:  06/23/23 (!) 140/90  06/15/23 (!) 153/104  06/24/22 110/80    Wt Readings from Last 3 Encounters:  06/23/23 184 lb (83.5 kg)  06/15/23 180 lb (81.6 kg)  06/24/22 171 lb (77.6 kg)       Physical Exam Constitutional: She appears well-developed and well-nourished. No distress.  HENT:  Head: Normocephalic and atraumatic.  Right Ear: External ear normal. Normal ear canal and TM Left Ear: External ear normal.  Normal ear canal and TM Mouth/Throat: Oropharynx is clear and moist.  Eyes: Conjunctivae normal.  Neck: Neck supple. No tracheal deviation present. No thyromegaly present.  No carotid bruit  Cardiovascular: Normal rate, regular rhythm and normal heart sounds.   No murmur heard.  No edema. Pulmonary/Chest: Effort normal and breath sounds normal. No respiratory distress.  She has no wheezes. She has no rales.  Breast: deferred   Abdominal: Soft. She exhibits no distension. There is no tenderness.  Lymphadenopathy: She has no cervical adenopathy.  Skin: Skin is warm and dry. She is not diaphoretic.  Psychiatric: She has a normal mood and affect. Her behavior is normal.     Lab Results  Component Value Date   WBC 7.9 06/21/2022   HGB 13.3 06/21/2022   HCT 39.5 06/21/2022   PLT 238.0 06/21/2022   GLUCOSE 113 (H) 06/21/2022   CHOL 285 (H) 06/21/2022   TRIG 157.0 (H) 06/21/2022   HDL 64.20 06/21/2022   LDLDIRECT 114.0 01/18/2022   LDLCALC 189 (H) 06/21/2022   ALT 14 06/21/2022   AST 14 06/21/2022   NA 139 06/21/2022   K 4.4  06/21/2022   CL 102 06/21/2022   CREATININE 0.84 06/21/2022   BUN 16 06/21/2022   CO2 29 06/21/2022   TSH 0.38 06/21/2022   HGBA1C 6.1 06/21/2022    The 10-year ASCVD risk score (Arnett DK, et al., 2019) is: 2.5%   Values used to calculate the score:     Age: 26 years     Sex: Female     Is Non-Hispanic African American: No     Diabetic: No     Tobacco smoker: No     Systolic Blood Pressure: 140 mmHg     Is BP treated: No     HDL Cholesterol: 64.2 mg/dL     Total Cholesterol: 285 mg/dL      Assessment & Plan:   Physical exam: Screening blood work  ordered Exercise none Weight has gained weight unfortunately due to steroids has not been with exercise due to pain-ultimately wants to be able to work on weight loss Substance abuse  none   Reviewed recommended immunizations.   Health Maintenance  Topic Date Due   COVID-19 Vaccine (6 - 2023-24 season) 07/01/2022   MAMMOGRAM  11/26/2022   INFLUENZA VACCINE  06/01/2023   Colonoscopy  07/18/2023   PAP SMEAR-Modifier  11/27/2023   DTaP/Tdap/Td (2 - Td or Tdap) 01/17/2026   Hepatitis C Screening  Completed   HIV Screening  Completed   HPV VACCINES  Aged Out   Zoster Vaccines- Shingrix  Discontinued     Mammogram up-to-date-will get report.     See Problem List for Assessment and Plan of chronic medical problems.

## 2023-06-23 ENCOUNTER — Ambulatory Visit (INDEPENDENT_AMBULATORY_CARE_PROVIDER_SITE_OTHER): Payer: Federal, State, Local not specified - PPO | Admitting: Internal Medicine

## 2023-06-23 VITALS — BP 138/82 | HR 79 | Temp 98.3°F | Ht 67.0 in | Wt 184.0 lb

## 2023-06-23 DIAGNOSIS — E7849 Other hyperlipidemia: Secondary | ICD-10-CM

## 2023-06-23 DIAGNOSIS — M0609 Rheumatoid arthritis without rheumatoid factor, multiple sites: Secondary | ICD-10-CM

## 2023-06-23 DIAGNOSIS — E559 Vitamin D deficiency, unspecified: Secondary | ICD-10-CM

## 2023-06-23 DIAGNOSIS — R208 Other disturbances of skin sensation: Secondary | ICD-10-CM

## 2023-06-23 DIAGNOSIS — G43001 Migraine without aura, not intractable, with status migrainosus: Secondary | ICD-10-CM | POA: Diagnosis not present

## 2023-06-23 DIAGNOSIS — R030 Elevated blood-pressure reading, without diagnosis of hypertension: Secondary | ICD-10-CM

## 2023-06-23 DIAGNOSIS — Z0000 Encounter for general adult medical examination without abnormal findings: Secondary | ICD-10-CM

## 2023-06-23 DIAGNOSIS — R7303 Prediabetes: Secondary | ICD-10-CM

## 2023-06-23 DIAGNOSIS — F411 Generalized anxiety disorder: Secondary | ICD-10-CM

## 2023-06-23 DIAGNOSIS — F3289 Other specified depressive episodes: Secondary | ICD-10-CM

## 2023-06-23 LAB — CBC WITH DIFFERENTIAL/PLATELET
Basophils Absolute: 0.1 10*3/uL (ref 0.0–0.1)
Basophils Relative: 0.6 % (ref 0.0–3.0)
Eosinophils Absolute: 0.1 10*3/uL (ref 0.0–0.7)
Eosinophils Relative: 1.3 % (ref 0.0–5.0)
HCT: 42.7 % (ref 36.0–46.0)
Hemoglobin: 13.8 g/dL (ref 12.0–15.0)
Lymphocytes Relative: 13.2 % (ref 12.0–46.0)
Lymphs Abs: 1.3 10*3/uL (ref 0.7–4.0)
MCHC: 32.4 g/dL (ref 30.0–36.0)
MCV: 88.6 fl (ref 78.0–100.0)
Monocytes Absolute: 0.8 10*3/uL (ref 0.1–1.0)
Monocytes Relative: 8.7 % (ref 3.0–12.0)
Neutro Abs: 7.4 10*3/uL (ref 1.4–7.7)
Neutrophils Relative %: 76.2 % (ref 43.0–77.0)
Platelets: 195 10*3/uL (ref 150.0–400.0)
RBC: 4.82 Mil/uL (ref 3.87–5.11)
RDW: 14.4 % (ref 11.5–15.5)
WBC: 9.6 10*3/uL (ref 4.0–10.5)

## 2023-06-23 LAB — COMPREHENSIVE METABOLIC PANEL
ALT: 25 U/L (ref 0–35)
AST: 21 U/L (ref 0–37)
Albumin: 4.1 g/dL (ref 3.5–5.2)
Alkaline Phosphatase: 59 U/L (ref 39–117)
BUN: 21 mg/dL (ref 6–23)
CO2: 31 mEq/L (ref 19–32)
Calcium: 9.4 mg/dL (ref 8.4–10.5)
Chloride: 101 mEq/L (ref 96–112)
Creatinine, Ser: 0.9 mg/dL (ref 0.40–1.20)
GFR: 72.85 mL/min (ref >60.00)
Glucose, Bld: 103 mg/dL — ABNORMAL HIGH (ref 70–99)
Potassium: 3.8 mEq/L (ref 3.5–5.1)
Sodium: 140 mEq/L (ref 135–145)
Total Bilirubin: 0.4 mg/dL (ref 0.2–1.2)
Total Protein: 6.7 g/dL (ref 6.0–8.3)

## 2023-06-23 LAB — LIPID PANEL
Cholesterol: 357 mg/dL — ABNORMAL HIGH (ref 0–200)
HDL: 78.1 mg/dL (ref >39.00)
LDL Cholesterol: 246 mg/dL — ABNORMAL HIGH (ref 0–99)
NonHDL: 278.82
Total CHOL/HDL Ratio: 5
Triglycerides: 165 mg/dL — ABNORMAL HIGH (ref 0.0–149.0)
VLDL: 33 mg/dL (ref 0.0–40.0)

## 2023-06-23 LAB — HEMOGLOBIN A1C: Hgb A1c MFr Bld: 6.1 % (ref 4.6–6.5)

## 2023-06-23 LAB — VITAMIN D 25 HYDROXY (VIT D DEFICIENCY, FRACTURES): VITD: 39.76 ng/mL (ref 30.00–100.00)

## 2023-06-23 LAB — TSH: TSH: 0.95 u[IU]/mL (ref 0.35–5.50)

## 2023-06-23 NOTE — Assessment & Plan Note (Signed)
Chronic Check a1c Low sugar / carb diet Stressed regular exercise  

## 2023-06-23 NOTE — Assessment & Plan Note (Signed)
Chronic Management per psychiatry On duloxetine, lorazepam

## 2023-06-23 NOTE — Assessment & Plan Note (Signed)
Chronic Regular exercise and healthy diet encouraged Check lipid panel, CMP Did not tolerate Crestor-caused muscle pain ASCVD risk below Continue lifestyle control

## 2023-06-23 NOTE — Assessment & Plan Note (Addendum)
Chronic Following with rheumatology Not controlled On arava - dose decrease On cimzia - started 6 weeks On dexamethasone

## 2023-06-23 NOTE — Assessment & Plan Note (Signed)
Chronic Migraines controlled Continue Zomig 5 mg nasal spray as needed, Zofran 4 mg ODT as needed

## 2023-06-23 NOTE — Assessment & Plan Note (Addendum)
Chronic Management per psychiatry On duloxetine, also recently started on Lamictal which may help as well

## 2023-06-23 NOTE — Assessment & Plan Note (Addendum)
Feet painful tingling, burning, sensitivity to warm water Recently started on Lamictal Possible small fiber polyneuropathy

## 2023-06-23 NOTE — Assessment & Plan Note (Signed)
Chronic Taking vitamin D daily Check vitamin D level  

## 2023-07-07 ENCOUNTER — Ambulatory Visit: Payer: Federal, State, Local not specified - PPO

## 2023-07-07 ENCOUNTER — Ambulatory Visit
Admission: EM | Admit: 2023-07-07 | Discharge: 2023-07-07 | Disposition: A | Discharge status: Home / Self Care | Payer: Federal, State, Local not specified - PPO | Attending: Urgent Care | Admitting: Urgent Care

## 2023-07-07 DIAGNOSIS — M069 Rheumatoid arthritis, unspecified: Secondary | ICD-10-CM | POA: Insufficient documentation

## 2023-07-07 DIAGNOSIS — Z7969 Long term (current) use of other immunomodulators and immunosuppressants: Secondary | ICD-10-CM | POA: Diagnosis not present

## 2023-07-07 DIAGNOSIS — J069 Acute upper respiratory infection, unspecified: Secondary | ICD-10-CM | POA: Insufficient documentation

## 2023-07-07 DIAGNOSIS — F332 Major depressive disorder, recurrent severe without psychotic features: Secondary | ICD-10-CM | POA: Diagnosis not present

## 2023-07-07 DIAGNOSIS — R0789 Other chest pain: Secondary | ICD-10-CM | POA: Diagnosis not present

## 2023-07-07 DIAGNOSIS — F4310 Post-traumatic stress disorder, unspecified: Secondary | ICD-10-CM | POA: Diagnosis not present

## 2023-07-07 DIAGNOSIS — R000 Tachycardia, unspecified: Secondary | ICD-10-CM | POA: Insufficient documentation

## 2023-07-07 DIAGNOSIS — Z1152 Encounter for screening for COVID-19: Secondary | ICD-10-CM | POA: Diagnosis not present

## 2023-07-07 DIAGNOSIS — R059 Cough, unspecified: Secondary | ICD-10-CM | POA: Diagnosis not present

## 2023-07-07 DIAGNOSIS — R5383 Other fatigue: Secondary | ICD-10-CM | POA: Diagnosis not present

## 2023-07-07 LAB — POCT MONO SCREEN (KUC): Mono, POC: NEGATIVE

## 2023-07-07 MED ORDER — AZITHROMYCIN 250 MG PO TABS: 250.0000 mg | ORAL_TABLET | Freq: Every day | ORAL | 0 refills | Status: DC

## 2023-07-07 MED ORDER — DEXAMETHASONE 6 MG PO TABS: 6.0000 mg | ORAL_TABLET | Freq: Every day | ORAL | 0 refills | Status: AC

## 2023-07-07 NOTE — ED Provider Notes (Signed)
[facility]    CSN: [id number] Arrival date & time: 07/07/23  1050      History   Chief Complaint Chief Complaint  Patient presents with   Fatigue   Cough    HPI KIESHIA GALLIS is a 52 y.o. female.   52 year old female presents today due to concerns of fatigue and cough.  She states symptoms started roughly 2 to 3 days ago.  She does have a history of rheumatoid arthritis for which she takes a biologic.  She states she recently started a new biologic.  She is concerned about her decreased immune system.  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  States over the past 2 days she feels like she has been run over by a truck with severe fatigue.  Wants to sleep more than normal.  Reports some sinus pressure, but states that all of the drainage has been clear when she is flushed it.  Reports a mildly sore throat.  Her main concern at this point is a cough and pressure in her chest.  Pushing on her chest does not reproduce the symptoms.  She has been taking over-the-counter medications including a cough prescription that she had previously, with no improvement to her symptoms.  She denies dyspnea on exertion, wheezing, or chest pain. Reports severe anxiety and hx of rapid heart rate at the doctors.   Cough   Past Medical History:  Diagnosis Date   ADHD    Allergy    Anxiety    severe   Arthritis    Bruxism (teeth grinding) 06/23/2021   WEARS  NIGHT GUARD WILL BRING DOS   Complication of anesthesia    "woke up during colonoscopy" age 85 years old   Constipation    Depression    Diverticulosis    Folliculitis 06/23/2021   ACNE PER PT   GERD (gastroesophageal reflux disease)    occ hiccup   Hemorrhoids    History of colon polyps    Hyperlipidemia    IBS (irritable bowel syndrome)    Migraines    Pre-diabetes    PTSD (post-traumatic stress disorder)    severe per psychiatry note dr Phillip Heal 06-18-2021   Restless legs syndrome (RLS)    Sciatica     Right side   Varicose veins of lower extremity    in vagina also and both legs   Wears glasses 06/23/2021    Patient Active Problem List   Diagnosis Date Noted   Allodynia 06/23/2023   Elevated blood pressure reading 06/23/2023   Carpal tunnel syndrome 06/17/2023   Vitamin D deficiency 06/21/2022   Rheumatoid arthritis (HCC) - Dr Deanne Coffer 06/21/2022   Frequent headaches 05/08/2022   ADHD 12/03/2021   Prolapse of female pelvic organs 06/29/2021   S/P laparoscopic assisted vaginal hysterectomy (LAVH) 06/29/2021   S/P Nissen fundoplication (without gastrostomy tube) procedure 09/05/2019   Chronic back pain 08/22/2019   Chronic constipation 11/16/2018   Migraine without aura and with status migrainosus, not intractable 04/12/2018   Bilateral low back pain 03/21/2018   Prediabetes 03/23/2017   Seborrheic dermatitis of scalp 03/23/2017   Hyperlipidemia 01/21/2016   Depression 01/18/2016   PTSD (post-traumatic stress disorder) 01/18/2016   GERD (gastroesophageal reflux disease) 01/18/2016   Varicose veins 05/21/2013   GAD (generalized anxiety disorder) 11/02/2012    Past Surgical History:  Procedure Laterality Date   24 HOUR PH STUDY N/A 04/01/2019   Procedure: 24 HOUR PH STUDY;  Surgeon: Marsa Aris  V, MD;  Location: [facility];  Service: Endoscopy;  Laterality: N/A;   ABLATION ON ENDOMETRIOSIS  10/04/2012   Procedure: ABLATION ON ENDOMETRIOSIS;  Surgeon: Mitchel Honour, DO;  Location: [facility];  Service: Gynecology;  Laterality: N/A;   ANTERIOR AND POSTERIOR REPAIR WITH SACROSPINOUS FIXATION N/A 06/29/2021   Procedure: ANTERIOR AND POSTERIOR REPAIR;  Surgeon: Mitchel Honour, DO;  Location: [name];  Service: Gynecology;  Laterality: N/A;   bravo test  03/11/2021   COLONOSCOPY  2019   79yr f/u   CONDYLOMA EXCISION/FULGURATION  10/04/2012   Procedure: CONDYLOMA REMOVAL;  Surgeon: Mitchel Honour, DO;  Location: [facility];  Service: Gynecology;  Laterality: N/A;    CYSTOSCOPY N/A 06/29/2021   Procedure: CYSTOSCOPY;  Surgeon: Mitchel Honour, DO;  Location: [facility];  Service: Gynecology;  Laterality: N/A;   DILITATION & CURRETTAGE/HYSTROSCOPY WITH NOVASURE ABLATION  10/04/2012   Procedure: DILATATION & CURETTAGE/HYSTEROSCOPY WITH NOVASURE ABLATION;  Surgeon: Mitchel Honour, DO;  Location: [facility];  Service: Gynecology;  Laterality: N/A;   ESOPHAGEAL MANOMETRY N/A 04/01/2019   Procedure: ESOPHAGEAL MANOMETRY (EM);  Surgeon: Napoleon Form, MD;  Location: [facility];  Service: Endoscopy;  Laterality: N/A;   HEMORRHOID BANDING     multiple times yrs ago   LAPAROSCOPIC NISSEN FUNDOPLICATION N/A 09/05/2019   Procedure: LAPAROSCOPIC HIATAL HERNIA REPAIR AND NISSEN FUNDOPLICATION;  Surgeon: Gaynelle Adu, MD;  Location: [facility];  Service: General;  Laterality: N/A;   LAPAROSCOPIC VAGINAL HYSTERECTOMY WITH SALPINGECTOMY Bilateral 06/29/2021   Procedure: LAPAROSCOPIC ASSISTED VAGINAL HYSTERECTOMY WITH BIILATERAL SALPINGECTOMY; CAUTERY OF VULVAR HEMANGIOMAS AND EXCISION OF LESION AT INTROITUS;  Surgeon: Mitchel Honour, DO;  Location: [facility];  Service: Gynecology;  Laterality: Bilateral;   LAPAROSCOPY  10/04/2012   Procedure: LAPAROSCOPY OPERATIVE;  Surgeon: Mitchel Honour, DO;  Location: [facility];  Service: Gynecology;  Laterality: N/A;   MASS EXCISION Bilateral 09/05/2019   Procedure: EXCISION BILATERAL SOFT TISSUE MASSES, THIGH;  Surgeon: Gaynelle Adu, MD;  Location: [facility];  Service: General;  Laterality: Bilateral;   RADIOFREQUENCY ABLATION NERVES     sciatic nerve last 3 to 4 vertebrate lower and spine and sciatic nerve both legs   UPPER GI ENDOSCOPY  04/01/2019    OB History   No obstetric history on file.      Home Medications    Prior to Admission medications   Medication Sig Start Date End Date Taking? Authorizing Provider  azithromycin (ZITHROMAX) 250 MG tablet Take 1 tablet (250 mg total) by mouth daily. Take first  2 tablets together, then 1 every day until finished. 07/07/23  Yes Trenton Verne L, PA  dexamethasone (DECADRON) 6 MG tablet Take 1 tablet (6 mg total) by mouth daily for 5 days. 07/07/23 07/12/23 Yes Emme Rosenau L, PA  clotrimazole (MYCELEX) 10 MG troche Take 1 tablet (10 mg total) by mouth 4 (four) times daily as needed. 09/16/22   Napoleon Form, MD  cyclobenzaprine (FLEXERIL) 5 MG tablet  11/21/21   [provider]  dexamethasone (DECADRON) 2 MG tablet  04/02/22   [provider]  DULoxetine (CYMBALTA) 60 MG capsule  04/28/22   [provider]  EPINEPHrine 0.3 mg/0.3 mL IJ SOAJ injection Inject 0.3 mg into the muscle as needed for anaphylaxis.    [provider]  lamoTRIgine (LAMICTAL) 25 MG tablet Take one pill daily x 1 week, then one pill twice daily x 1 week, then one pill three times daily x 1 week, then 2 pills  twice daily x 1 week, then start other prescription 06/15/23   Asa Lente, MD  leflunomide (ARAVA) 20 MG tablet Take 10 mg by mouth daily. Patient takes 10 mg only 03/30/22   [provider]  LORazepam (ATIVAN) 1 MG tablet  04/28/22   [provider]  ondansetron (ZOFRAN-ODT) 4 MG disintegrating tablet  12/21/21   [provider]  traZODone (DESYREL) 50 MG tablet 1-2 tablet at bedtime as needed Orally Once a day    [provider]  zolmitriptan (ZOMIG) 5 MG nasal solution PLACE 1 SPRAY INTO THE NOSE AS NEEDED FOR MIGRAINE. 04/27/22   Pincus Sanes, MD    Family History Family History  Problem Relation Age of Onset   Heart disease Mother    Arthritis Mother    Hyperlipidemia Mother    Hypertension Mother    Heart attack Mother    Colon polyps Father    Hashimoto's thyroiditis Sister    Diabetes Sister    Thyroid disease Sister    Colon cancer Paternal Grandfather    Thyroid disease Sister     Social History Social History   Tobacco Use   Smoking status: Never   Smokeless tobacco: Never   Vaping Use   Vaping status: Never Used  Substance Use Topics   Alcohol use: Not Currently   Drug use: No     Allergies   Capsaicin, Piper, Black pepper-turmeric, Methotrexate, Nsaids, Other, Rosuvastatin, Atorvastatin, Flagyl [metronidazole], and Prednisone   Review of Systems Review of Systems  Respiratory:  Positive for cough.   As per HPI   Physical Exam Triage Vital Signs ED Triage Vitals  Encounter Vitals Group     BP 07/07/23 1145 (!) 133/97     Systolic BP Percentile --      Diastolic BP Percentile --      Pulse Rate 07/07/23 1145 (!) 128     Resp 07/07/23 1141 17     Temp 07/07/23 1141 98.7 F (37.1 C)     Temp Source 07/07/23 1141 Oral     SpO2 07/07/23 1141 95 %     Weight --      Height --      Head Circumference --      Peak Flow --      Pain Score 07/07/23 1140 2     Pain Loc --      Pain Education --      Exclude from Growth Chart --    No data found.  Updated Vital Signs BP (!) 133/97 (BP Location: Right Arm)   Pulse (!) 128   Temp 98.7 F (37.1 C) (Oral)   Resp 17   LMP 01/19/2020   SpO2 95%   Visual Acuity Right Eye Distance:   Left Eye Distance:   Bilateral Distance:    Right Eye Near:   Left Eye Near:    Bilateral Near:     Physical Exam Vitals and nursing note reviewed.  Constitutional:      General: She is not in acute distress.    Appearance: Normal appearance. She is normal weight. She is ill-appearing. She is not toxic-appearing or diaphoretic.  HENT:     Head: Normocephalic and atraumatic.     Right Ear: Tympanic membrane, ear canal and external ear normal. There is no impacted cerumen.     Left Ear: Tympanic membrane, ear canal and external ear normal. There is no impacted cerumen.     Nose: Congestion present. No rhinorrhea.  Mouth/Throat:     Mouth: Mucous membranes are moist.     Pharynx: Oropharynx is clear. No oropharyngeal exudate or posterior oropharyngeal erythema.  Eyes:     General: No scleral icterus.        Right eye: No discharge.        Left eye: No discharge.     Extraocular Movements: Extraocular movements intact.     Conjunctiva/sclera: Conjunctivae normal.     Pupils: Pupils are equal, round, and reactive to light.  Cardiovascular:     Rate and Rhythm: Regular rhythm. Tachycardia present.     Pulses: Normal pulses.     Heart sounds: Normal heart sounds.     No friction rub. No gallop.  Pulmonary:     Effort: Pulmonary effort is normal. No respiratory distress.     Breath sounds: No stridor. Rhonchi (RUL posteriorly only) present. No wheezing or rales.  Musculoskeletal:        General: Normal range of motion.     Cervical back: Normal range of motion and neck supple. No rigidity or tenderness.  Lymphadenopathy:     Cervical: No cervical adenopathy.  Skin:    General: Skin is warm and dry.     Coloration: Skin is not jaundiced.     Findings: No erythema or rash.  Neurological:     General: No focal deficit present.     Mental Status: She is alert and oriented to person, place, and time.     Motor: No weakness.      UC Treatments / Results  Labs (all labs ordered are listed, but only abnormal results are displayed) Labs Reviewed  SARS CORONAVIRUS 2 (TAT 6-24 HRS)  POCT MONO SCREEN [facility])    EKG   Radiology DG Chest 2 View  Result Date: 07/07/2023 CLINICAL DATA:  Cough, chest discomfort, tachycardia EXAM: CHEST - 2 VIEW COMPARISON:  Chest radiograph 12/27/2001 FINDINGS: The cardiomediastinal silhouette is normal There is no focal consolidation or pulmonary edema. There is no pleural effusion or pneumothorax There is no acute osseous abnormality. IMPRESSION: No radiographic evidence of acute cardiopulmonary process. Electronically Signed   By: Lesia Hausen M.D.   On: 07/07/2023 14:22    Procedures Procedures (including critical care time)  Medications Ordered in UC Medications - No data to display  Initial Impression / Assessment and Plan / UC Course  I have  reviewed the triage vital signs and the nursing notes.  Pertinent labs & imaging results that were available during my care of the patient were reviewed by me and considered in my medical decision making (see chart for details).     URI - viral. Highly suspicious for covid. PCR swab obtained and sent for assessment. Pt will be candidate for antiviral if positive. Given URI sx, will start tx with azithromycin and 6mg  dexamethasone to cover for possible secondary bacterial infection.  Fatigue - secondary to #1. Monospot negative. If sx persist or worsen, follow up with PCP for additional workup.   Final Clinical Impressions(s) / UC Diagnoses   Final diagnoses:  Acute upper respiratory infection     Discharge Instructions      We are still awaiting the radiologist read on your xray. I do not see any evidence of pneumonia but will call if any abnormalities are noted by radiology.  For now, we will stop your current dexamethasone tab and increase to 6mg  once daily for 5 days. Then, resume your normal 2mg  tab.  Start taking Azithromycin per pack  instructions.  We will receive the results of your covid test within 24 hours. If positive, we will start the antiviral therapy. REST! Drink plenty of water.   If you develop severe chest pain, shortness of breath or any new/worsening symptoms, please head to the ER.     ED Prescriptions     Medication Sig Dispense Auth. Provider   dexamethasone (DECADRON) 6 MG tablet Take 1 tablet (6 mg total) by mouth daily for 5 days. 5 tablet Schwanda Zima L, PA   azithromycin (ZITHROMAX) 250 MG tablet Take 1 tablet (250 mg total) by mouth daily. Take first 2 tablets together, then 1 every day until finished. 6 tablet Dagmawi Venable L, [place]      PDMP not reviewed this encounter.   Maretta Bees, [place] 07/07/23 1511

## 2023-07-07 NOTE — Discharge Instructions (Addendum)
We are still awaiting the radiologist read on your xray. I do not see any evidence of pneumonia but will call if any abnormalities are noted by radiology.  For now, we will stop your current dexamethasone tab and increase to 6mg  once daily for 5 days. Then, resume your normal 2mg  tab.  Start taking Azithromycin per pack instructions.  We will receive the results of your covid test within 24 hours. If positive, we will start the antiviral therapy. REST! Drink plenty of water.   If you develop severe chest pain, shortness of breath or any new/worsening symptoms, please head to the ER.

## 2023-07-07 NOTE — ED Triage Notes (Signed)
Pt presents with a productive cough x 2 days. States her throat has been sore, has had a nasal drainage.   Home interventions: zyrtec, nasal rinsing

## 2023-07-08 LAB — SARS CORONAVIRUS 2 (TAT 6-24 HRS): SARS Coronavirus 2: NEGATIVE

## 2023-07-12 ENCOUNTER — Encounter: Payer: Self-pay | Admitting: Neurology

## 2023-07-12 MED ORDER — LAMOTRIGINE 25 MG PO TABS: 50.0000 mg | ORAL_TABLET | Freq: Two times a day (BID) | ORAL | 4 refills | Status: DC

## 2023-07-12 NOTE — Telephone Encounter (Signed)
Spoke to pt,  she will continue with lamotrigine 25mg  tablets taking 2 tabs twice daily.  New prescription to pharmacy.  Cancelled previous script.  (Pt wanted label on the bottle).  I relayed not problem.  She did understand plan.  Appreciated call back

## 2023-07-13 DIAGNOSIS — G4733 Obstructive sleep apnea (adult) (pediatric): Secondary | ICD-10-CM | POA: Diagnosis not present

## 2023-07-14 DIAGNOSIS — F332 Major depressive disorder, recurrent severe without psychotic features: Secondary | ICD-10-CM | POA: Diagnosis not present

## 2023-07-14 DIAGNOSIS — F4310 Post-traumatic stress disorder, unspecified: Secondary | ICD-10-CM | POA: Diagnosis not present

## 2023-07-19 DIAGNOSIS — M0609 Rheumatoid arthritis without rheumatoid factor, multiple sites: Secondary | ICD-10-CM | POA: Diagnosis not present

## 2023-07-28 DIAGNOSIS — Z79899 Other long term (current) drug therapy: Secondary | ICD-10-CM | POA: Diagnosis not present

## 2023-07-28 DIAGNOSIS — M1990 Unspecified osteoarthritis, unspecified site: Secondary | ICD-10-CM | POA: Diagnosis not present

## 2023-07-28 DIAGNOSIS — M797 Fibromyalgia: Secondary | ICD-10-CM | POA: Diagnosis not present

## 2023-07-28 DIAGNOSIS — M0609 Rheumatoid arthritis without rheumatoid factor, multiple sites: Secondary | ICD-10-CM | POA: Diagnosis not present

## 2023-08-11 DIAGNOSIS — F4310 Post-traumatic stress disorder, unspecified: Secondary | ICD-10-CM | POA: Diagnosis not present

## 2023-08-11 DIAGNOSIS — F332 Major depressive disorder, recurrent severe without psychotic features: Secondary | ICD-10-CM | POA: Diagnosis not present

## 2023-08-18 DIAGNOSIS — F4310 Post-traumatic stress disorder, unspecified: Secondary | ICD-10-CM | POA: Diagnosis not present

## 2023-08-18 DIAGNOSIS — F332 Major depressive disorder, recurrent severe without psychotic features: Secondary | ICD-10-CM | POA: Diagnosis not present

## 2023-08-25 DIAGNOSIS — F332 Major depressive disorder, recurrent severe without psychotic features: Secondary | ICD-10-CM | POA: Diagnosis not present

## 2023-08-25 DIAGNOSIS — F4310 Post-traumatic stress disorder, unspecified: Secondary | ICD-10-CM | POA: Diagnosis not present

## 2023-09-01 DIAGNOSIS — F332 Major depressive disorder, recurrent severe without psychotic features: Secondary | ICD-10-CM | POA: Diagnosis not present

## 2023-09-01 DIAGNOSIS — F4310 Post-traumatic stress disorder, unspecified: Secondary | ICD-10-CM | POA: Diagnosis not present

## 2023-09-05 ENCOUNTER — Encounter: Payer: Self-pay | Admitting: Gastroenterology

## 2023-09-08 DIAGNOSIS — F4310 Post-traumatic stress disorder, unspecified: Secondary | ICD-10-CM | POA: Diagnosis not present

## 2023-09-08 DIAGNOSIS — F332 Major depressive disorder, recurrent severe without psychotic features: Secondary | ICD-10-CM | POA: Diagnosis not present

## 2023-09-15 DIAGNOSIS — F4310 Post-traumatic stress disorder, unspecified: Secondary | ICD-10-CM | POA: Diagnosis not present

## 2023-09-15 DIAGNOSIS — F332 Major depressive disorder, recurrent severe without psychotic features: Secondary | ICD-10-CM | POA: Diagnosis not present

## 2023-09-19 DIAGNOSIS — M0609 Rheumatoid arthritis without rheumatoid factor, multiple sites: Secondary | ICD-10-CM | POA: Diagnosis not present

## 2023-09-19 DIAGNOSIS — Z79899 Other long term (current) drug therapy: Secondary | ICD-10-CM | POA: Diagnosis not present

## 2023-09-19 DIAGNOSIS — M1990 Unspecified osteoarthritis, unspecified site: Secondary | ICD-10-CM | POA: Diagnosis not present

## 2023-09-19 DIAGNOSIS — M797 Fibromyalgia: Secondary | ICD-10-CM | POA: Diagnosis not present

## 2023-09-20 DIAGNOSIS — M0609 Rheumatoid arthritis without rheumatoid factor, multiple sites: Secondary | ICD-10-CM | POA: Diagnosis not present

## 2023-09-22 DIAGNOSIS — F332 Major depressive disorder, recurrent severe without psychotic features: Secondary | ICD-10-CM | POA: Diagnosis not present

## 2023-09-22 DIAGNOSIS — F4310 Post-traumatic stress disorder, unspecified: Secondary | ICD-10-CM | POA: Diagnosis not present

## 2023-10-06 DIAGNOSIS — F4310 Post-traumatic stress disorder, unspecified: Secondary | ICD-10-CM | POA: Diagnosis not present

## 2023-10-06 DIAGNOSIS — F332 Major depressive disorder, recurrent severe without psychotic features: Secondary | ICD-10-CM | POA: Diagnosis not present

## 2023-10-12 DIAGNOSIS — F4310 Post-traumatic stress disorder, unspecified: Secondary | ICD-10-CM | POA: Diagnosis not present

## 2023-10-13 DIAGNOSIS — F332 Major depressive disorder, recurrent severe without psychotic features: Secondary | ICD-10-CM | POA: Diagnosis not present

## 2023-10-13 DIAGNOSIS — F4310 Post-traumatic stress disorder, unspecified: Secondary | ICD-10-CM | POA: Diagnosis not present

## 2023-10-19 DIAGNOSIS — M0609 Rheumatoid arthritis without rheumatoid factor, multiple sites: Secondary | ICD-10-CM | POA: Diagnosis not present

## 2023-10-20 DIAGNOSIS — F332 Major depressive disorder, recurrent severe without psychotic features: Secondary | ICD-10-CM | POA: Diagnosis not present

## 2023-10-20 DIAGNOSIS — F4310 Post-traumatic stress disorder, unspecified: Secondary | ICD-10-CM | POA: Diagnosis not present

## 2023-11-10 DIAGNOSIS — F4310 Post-traumatic stress disorder, unspecified: Secondary | ICD-10-CM | POA: Diagnosis not present

## 2023-11-10 DIAGNOSIS — F332 Major depressive disorder, recurrent severe without psychotic features: Secondary | ICD-10-CM | POA: Diagnosis not present

## 2023-11-20 DIAGNOSIS — M0609 Rheumatoid arthritis without rheumatoid factor, multiple sites: Secondary | ICD-10-CM | POA: Diagnosis not present

## 2023-12-21 DIAGNOSIS — Z79899 Other long term (current) drug therapy: Secondary | ICD-10-CM | POA: Diagnosis not present

## 2023-12-21 DIAGNOSIS — M797 Fibromyalgia: Secondary | ICD-10-CM | POA: Diagnosis not present

## 2023-12-21 DIAGNOSIS — M1990 Unspecified osteoarthritis, unspecified site: Secondary | ICD-10-CM | POA: Diagnosis not present

## 2023-12-21 DIAGNOSIS — M0609 Rheumatoid arthritis without rheumatoid factor, multiple sites: Secondary | ICD-10-CM | POA: Diagnosis not present

## 2023-12-24 ENCOUNTER — Encounter: Payer: Self-pay | Admitting: Internal Medicine

## 2023-12-24 NOTE — Patient Instructions (Addendum)
      Blood work was ordered.       Medications changes include :   None   A CT scan of your heart was ordered.     Return in about 6 months (around 06/25/2024) for Physical Exam.

## 2023-12-24 NOTE — Progress Notes (Unsigned)
 Subjective:    Patient ID: Pamela Novak, female    DOB: [birth year], 53 y.o.   MRN: [id number]     HPI Pamela Novak is here for follow up of her chronic medical problems.  ? Try different statin or zetia, simvastatin  ? Ct cac ?  She is in a lot of pain from her RA.  She is having increased stress.  She is fatigued today.   Medications and allergies reviewed with patient and updated if appropriate.  Current Outpatient Medications on File Prior to Visit  Medication Sig Dispense Refill   clotrimazole (MYCELEX) 10 MG troche Take 1 tablet (10 mg total) by mouth 4 (four) times daily as needed. 70 Troche 0   cyclobenzaprine (FLEXERIL) 5 MG tablet      dexamethasone (DECADRON) 2 MG tablet      DULoxetine (CYMBALTA) 60 MG capsule      EPINEPHrine 0.3 mg/0.3 mL IJ SOAJ injection Inject 0.3 mg into the muscle as needed for anaphylaxis.     lamoTRIgine (LAMICTAL) 25 MG tablet Take 2 tablets (50 mg total) by mouth 2 (two) times daily. 120 tablet 4   leflunomide (ARAVA) 20 MG tablet Take 10 mg by mouth daily. Patient takes 10 mg only     LORazepam (ATIVAN) 1 MG tablet      ondansetron (ZOFRAN-ODT) 4 MG disintegrating tablet      traZODone (DESYREL) 50 MG tablet 1-2 tablet at bedtime as needed Orally Once a day     zolmitriptan (ZOMIG) 5 MG nasal solution PLACE 1 SPRAY INTO THE NOSE AS NEEDED FOR MIGRAINE. 6 each 5   No current facility-administered medications on file prior to visit.     Review of Systems  Constitutional:  Negative for fever.  Respiratory:  Negative for cough, shortness of breath and wheezing.   Cardiovascular:  Positive for chest pain (anxiety related) and palpitations (anxiety related). Negative for leg swelling.  Musculoskeletal:  Positive for arthralgias and myalgias.  Neurological:  Positive for headaches. Negative for light-headedness.       Objective:   Vitals:   12/27/23 0747  BP: 118/82  Pulse: 100  Temp: 97.6 F (36.4 C)  SpO2: 97%   BP Readings  from Last 3 Encounters:  12/27/23 118/82  07/07/23 (!) 133/97  06/23/23 138/82   Wt Readings from Last 3 Encounters:  12/27/23 194 lb (88 kg)  06/23/23 184 lb (83.5 kg)  06/15/23 180 lb (81.6 kg)   Body mass index is 30.38 kg/m.    Physical Exam Constitutional:      General: She is not in acute distress.    Appearance: Normal appearance.  HENT:     Head: Normocephalic and atraumatic.  Eyes:     Conjunctiva/sclera: Conjunctivae normal.  Cardiovascular:     Rate and Rhythm: Normal rate and regular rhythm.     Heart sounds: Normal heart sounds.  Pulmonary:     Effort: Pulmonary effort is normal. No respiratory distress.     Breath sounds: Normal breath sounds. No wheezing.  Musculoskeletal:     Cervical back: Neck supple.     Right lower leg: No edema.     Left lower leg: No edema.  Lymphadenopathy:     Cervical: No cervical adenopathy.  Skin:    General: Skin is warm and dry.     Findings: No rash.  Neurological:     Mental Status: She is alert. Mental status is at baseline.  Psychiatric:  Mood and Affect: Mood normal.        Behavior: Behavior normal.        Lab Results  Component Value Date   WBC 9.6 06/23/2023   HGB 13.8 06/23/2023   HCT 42.7 06/23/2023   PLT 195.0 06/23/2023   GLUCOSE 103 (H) 06/23/2023   CHOL 357 (H) 06/23/2023   TRIG 165.0 (H) 06/23/2023   HDL 78.10 06/23/2023   LDLDIRECT 114.0 01/18/2022   LDLCALC 246 (H) 06/23/2023   ALT 25 06/23/2023   AST 21 06/23/2023   NA 140 06/23/2023   K 3.8 06/23/2023   CL 101 06/23/2023   CREATININE 0.90 06/23/2023   BUN 21 06/23/2023   CO2 31 06/23/2023   TSH 0.95 06/23/2023   HGBA1C 6.1 06/23/2023     Assessment & Plan:    See Problem List for Assessment and Plan of chronic medical problems.

## 2023-12-27 ENCOUNTER — Ambulatory Visit: Payer: Federal, State, Local not specified - PPO | Admitting: Internal Medicine

## 2023-12-27 VITALS — BP 118/82 | HR 100 | Temp 97.6°F | Ht 67.0 in | Wt 194.0 lb

## 2023-12-27 DIAGNOSIS — Z23 Encounter for immunization: Secondary | ICD-10-CM | POA: Diagnosis not present

## 2023-12-27 DIAGNOSIS — M0609 Rheumatoid arthritis without rheumatoid factor, multiple sites: Secondary | ICD-10-CM | POA: Diagnosis not present

## 2023-12-27 DIAGNOSIS — F3289 Other specified depressive episodes: Secondary | ICD-10-CM

## 2023-12-27 DIAGNOSIS — E7849 Other hyperlipidemia: Secondary | ICD-10-CM

## 2023-12-27 DIAGNOSIS — Z136 Encounter for screening for cardiovascular disorders: Secondary | ICD-10-CM

## 2023-12-27 DIAGNOSIS — R7303 Prediabetes: Secondary | ICD-10-CM

## 2023-12-27 DIAGNOSIS — F411 Generalized anxiety disorder: Secondary | ICD-10-CM

## 2023-12-27 DIAGNOSIS — G43001 Migraine without aura, not intractable, with status migrainosus: Secondary | ICD-10-CM

## 2023-12-27 LAB — LIPID PANEL
Cholesterol: 311 mg/dL — ABNORMAL HIGH (ref 0–200)
HDL: 47.2 mg/dL (ref >39.00)
LDL Cholesterol: 203 mg/dL — ABNORMAL HIGH (ref 0–99)
NonHDL: 263.66
Total CHOL/HDL Ratio: 7
Triglycerides: 303 mg/dL — ABNORMAL HIGH (ref 0.0–149.0)
VLDL: 60.6 mg/dL — ABNORMAL HIGH (ref 0.0–40.0)

## 2023-12-27 LAB — COMPREHENSIVE METABOLIC PANEL
ALT: 14 U/L (ref 0–35)
AST: 19 U/L (ref 0–37)
Albumin: 4.3 g/dL (ref 3.5–5.2)
Alkaline Phosphatase: 55 U/L (ref 39–117)
BUN: 15 mg/dL (ref 6–23)
CO2: 28 meq/L (ref 19–32)
Calcium: 9.2 mg/dL (ref 8.4–10.5)
Chloride: 105 meq/L (ref 96–112)
Creatinine, Ser: 0.91 mg/dL (ref 0.40–1.20)
GFR: 71.63 mL/min (ref >60.00)
Glucose, Bld: 111 mg/dL — ABNORMAL HIGH (ref 70–99)
Potassium: 3.8 meq/L (ref 3.5–5.1)
Sodium: 142 meq/L (ref 135–145)
Total Bilirubin: 0.5 mg/dL (ref 0.2–1.2)
Total Protein: 6.8 g/dL (ref 6.0–8.3)

## 2023-12-27 LAB — HEMOGLOBIN A1C: Hgb A1c MFr Bld: 5.9 % (ref 4.6–6.5)

## 2023-12-27 NOTE — Assessment & Plan Note (Addendum)
 Chronic Regular exercise and healthy diet encouraged Check lipid panel, CMP Did not tolerate Crestor-caused muscle pain LDL very high and should be on medication Discussed trying a different statin, Zetia, getting a CT CAC Ct cac ordered

## 2023-12-27 NOTE — Assessment & Plan Note (Signed)
Chronic Migraines controlled Continue Zomig 5 mg nasal spray as needed, Zofran 4 mg ODT as needed

## 2023-12-27 NOTE — Assessment & Plan Note (Signed)
 Chronic Management per psychiatry On duloxetine, also on Lamictal which may help as well

## 2023-12-27 NOTE — Assessment & Plan Note (Signed)
 Chronic Lab Results  Component Value Date   HGBA1C 6.1 06/23/2023   Check a1c Low sugar / carb diet Stressed regular exercise

## 2023-12-27 NOTE — Assessment & Plan Note (Signed)
Chronic Management per psychiatry On duloxetine, lorazepam

## 2023-12-27 NOTE — Addendum Note (Signed)
 Addended by: Pincus Sanes on: 12/27/2023 08:48 AM   Modules accepted: Level of Service

## 2023-12-27 NOTE — Addendum Note (Signed)
 Addended by: Trudee Kuster on: 12/27/2023 08:45 AM   Modules accepted: Orders

## 2023-12-27 NOTE — Assessment & Plan Note (Addendum)
 Chronic Following with rheumatology On arava  On dexamethasone

## 2023-12-28 ENCOUNTER — Encounter: Payer: Self-pay | Admitting: Internal Medicine

## 2024-01-02 DIAGNOSIS — M0609 Rheumatoid arthritis without rheumatoid factor, multiple sites: Secondary | ICD-10-CM | POA: Diagnosis not present

## 2024-01-04 ENCOUNTER — Ambulatory Visit (HOSPITAL_COMMUNITY)
Admission: RE | Admit: 2024-01-04 | Discharge: 2024-01-04 | Disposition: A | Discharge status: Home / Self Care | Payer: Self-pay | Source: Ambulatory Visit | Attending: Internal Medicine | Admitting: Internal Medicine

## 2024-01-04 DIAGNOSIS — Z136 Encounter for screening for cardiovascular disorders: Secondary | ICD-10-CM | POA: Insufficient documentation

## 2024-01-07 ENCOUNTER — Encounter: Payer: Self-pay | Admitting: Internal Medicine

## 2024-01-16 DIAGNOSIS — M0609 Rheumatoid arthritis without rheumatoid factor, multiple sites: Secondary | ICD-10-CM | POA: Diagnosis not present

## 2024-01-17 ENCOUNTER — Ambulatory Visit: Payer: Federal, State, Local not specified - PPO

## 2024-01-17 VITALS — Ht 67.0 in | Wt 191.0 lb

## 2024-01-17 DIAGNOSIS — Z860100 Personal history of colon polyps, unspecified: Secondary | ICD-10-CM

## 2024-01-17 MED ORDER — NA SULFATE-K SULFATE-MG SULF 17.5-3.13-1.6 GM/177ML PO SOLN
1.0000 | Freq: Once | ORAL | 0 refills | Status: AC
Start: 2024-01-17 — End: 2024-01-17

## 2024-01-17 NOTE — Patient Instructions (Signed)
 [facility] has implemented a new process for scheduling procedures.  Please note your arrival time for the [facility] is your appointment time that is shown on your written instructions.  Please do not arrive one hour prior to the time listed in your instructions.  Please ignore any outside notifications to arrive one hour early.  We apologize for any confusion and look forward to seeing you for your procedure.

## 2024-01-17 NOTE — Progress Notes (Signed)
 No egg or soy allergy known to patient  No issues known to pt with past sedation with any surgeries or procedures Patient denies ever being told they had issues or difficulty with intubation  No FH of Malignant Hyperthermia Pt is not on diet pills Pt is not on  home 02  Pt is not on blood thinners  Pt denies issues with constipation  No A fib or A flutter. Hx of tachycardia when anxious.  Have any cardiac testing pending--no  LOA: independent  Prep: suprep  Patient's chart reviewed by Cathlyn Parsons CNRA prior to previsit and patient appropriate for the [facility].  Previsit completed and red dot placed by patient's name on their procedure day (on provider's schedule).     PV completed with patient. Prep instructions sent via mychart and home address.

## 2024-01-23 ENCOUNTER — Encounter: Payer: Self-pay | Admitting: Gastroenterology

## 2024-01-30 DIAGNOSIS — M0609 Rheumatoid arthritis without rheumatoid factor, multiple sites: Secondary | ICD-10-CM | POA: Diagnosis not present

## 2024-02-06 ENCOUNTER — Encounter: Payer: Self-pay | Admitting: Gastroenterology

## 2024-02-06 ENCOUNTER — Ambulatory Visit: Payer: Federal, State, Local not specified - PPO | Admitting: Gastroenterology

## 2024-02-06 VITALS — BP 118/77 | HR 109 | Temp 98.2°F | Resp 16 | Ht 67.0 in | Wt 191.0 lb

## 2024-02-06 DIAGNOSIS — D123 Benign neoplasm of transverse colon: Secondary | ICD-10-CM | POA: Diagnosis not present

## 2024-02-06 DIAGNOSIS — Z860101 Personal history of adenomatous and serrated colon polyps: Secondary | ICD-10-CM

## 2024-02-06 DIAGNOSIS — K635 Polyp of colon: Secondary | ICD-10-CM | POA: Diagnosis not present

## 2024-02-06 DIAGNOSIS — Z1211 Encounter for screening for malignant neoplasm of colon: Secondary | ICD-10-CM | POA: Diagnosis not present

## 2024-02-06 DIAGNOSIS — Z860100 Personal history of colon polyps, unspecified: Secondary | ICD-10-CM

## 2024-02-06 DIAGNOSIS — D122 Benign neoplasm of ascending colon: Secondary | ICD-10-CM

## 2024-02-06 DIAGNOSIS — K648 Other hemorrhoids: Secondary | ICD-10-CM | POA: Diagnosis not present

## 2024-02-06 MED ORDER — SODIUM CHLORIDE 0.9 % IV SOLN: 500.0000 mL | Freq: Once | INTRAVENOUS | Status: DC

## 2024-02-06 NOTE — Op Note (Signed)
 [facility] Patient Name: Pamela Novak Procedure Date: 02/06/2024 10:36 AM MRN: [id number] Endoscopist: Napoleon Form , MD, [id number] Age: 53 Referring MD:  Date of Birth: [birth year] Gender: Female Account #: [id number] Procedure:                Colonoscopy Indications:              Colon cancer screening in patient with 1st-degree                            relative having advanced adenoma of the colon                            before age 76, High risk colon cancer surveillance:                            Personal history of colonic polyps Medicines:                Monitored Anesthesia Care Procedure:                Pre-Anesthesia Assessment:                           - Prior to the procedure, a History and Physical                            was performed, and patient medications and                            allergies were reviewed. The patient's tolerance of                            previous anesthesia was also reviewed. The risks                            and benefits of the procedure and the sedation                            options and risks were discussed with the patient.                            All questions were answered, and informed consent                            was obtained. Prior Anticoagulants: The patient has                            taken no anticoagulant or antiplatelet agents. ASA                            Grade Assessment: II - A patient with mild systemic                            disease. After reviewing the risks and benefits,  the patient was deemed in satisfactory condition to                            undergo the procedure.                           After obtaining informed consent, the colonoscope                            was passed under direct vision. Throughout the                            procedure, the patient's blood pressure, pulse, and                            oxygen saturations  were monitored continuously. The                            Olympus Scope SN: [id number] was introduced through                            the anus and advanced to the the cecum, identified                            by appendiceal orifice and ileocecal valve. The                            colonoscopy was performed without difficulty. The                            patient tolerated the procedure well. The quality                            of the bowel preparation was good. The ileocecal                            valve, appendiceal orifice, and rectum were                            photographed. Scope In: 10:44:48 AM Scope Out: 10:56:44 AM Scope Withdrawal Time: 0 hours 9 minutes 2 seconds  Total Procedure Duration: 0 hours 11 minutes 56 seconds  Findings:                 The perianal and digital rectal examinations were                            normal.                           A 3 mm polyp was found in the ascending colon. The                            polyp was sessile. The polyp was removed with a  cold snare. Resection and retrieval were complete.                           A less than 1 mm polyp was found in the transverse                            colon. The polyp was sessile. The polyp was removed                            with a cold biopsy forceps. Resection and retrieval                            were complete.                           Non-bleeding internal hemorrhoids were found during                            retroflexion. The hemorrhoids were small. Complications:            No immediate complications. Estimated Blood Loss:     Estimated blood loss was minimal. Impression:               - One 3 mm polyp in the ascending colon, removed                            with a cold snare. Resected and retrieved.                           - One less than 1 mm polyp in the transverse colon,                            removed with a cold biopsy  forceps. Resected and                            retrieved.                           - Non-bleeding internal hemorrhoids. Recommendation:           - Patient has a contact number available for                            emergencies. The signs and symptoms of potential                            delayed complications were discussed with the                            patient. Return to normal activities tomorrow.                            Written discharge instructions were provided to the  patient.                           - Resume previous diet.                           - Continue present medications.                           - Await pathology results.                           - Repeat colonoscopy in 5 years for surveillance                            based on pathology results. Napoleon Form, MD 02/06/2024 11:03:05 AM This report has been signed electronically.

## 2024-02-06 NOTE — Patient Instructions (Signed)

## 2024-02-06 NOTE — Progress Notes (Signed)
 Called to room to assist during endoscopic procedure.  Patient ID and intended procedure confirmed with present staff. Received instructions for my participation in the procedure from the performing physician.

## 2024-02-06 NOTE — Progress Notes (Signed)
 Sedate, gd SR, tolerated procedure well, VSS, report to RN

## 2024-02-06 NOTE — Progress Notes (Signed)
 [facility] History and Physical   Primary Care Physician:  Pincus Sanes, MD   Reason for Procedure:  History of adenomatous colon polyps  Plan:    Surveillance colonoscopy with possible interventions as needed     HPI: Pamela Novak is a very pleasant 53 y.o. female here for surveillance colonoscopy. Denies any nausea, vomiting, abdominal pain, melena or bright red blood per rectum  The risks and benefits as well as alternatives of endoscopic procedure(s) have been discussed and reviewed. All questions answered. The patient agrees to proceed.    Past Medical History:  Diagnosis Date   ADHD    Allergy    Anxiety    severe   Arthritis    Bruxism (teeth grinding) 06/23/2021   WEARS  NIGHT GUARD WILL BRING DOS   Complication of anesthesia    "woke up during colonoscopy" age 47 years old   Constipation    Depression    Diverticulosis    Folliculitis 06/23/2021   ACNE PER PT   GERD (gastroesophageal reflux disease)    occ hiccup   Hemorrhoids    History of colon polyps    Hyperlipidemia    IBS (irritable bowel syndrome)    Migraines    Pre-diabetes    PTSD (post-traumatic stress disorder)    severe per psychiatry note dr Phillip Heal 06-18-2021   Restless legs syndrome (RLS)    Sciatica    Right side   Varicose veins of lower extremity    in vagina also and both legs   Wears glasses 06/23/2021    Past Surgical History:  Procedure Laterality Date   24 HOUR PH STUDY N/A 04/01/2019   Procedure: 24 HOUR PH STUDY;  Surgeon: Napoleon Form, MD;  Location: [facility];  Service: Endoscopy;  Laterality: N/A;   ABLATION ON ENDOMETRIOSIS  10/04/2012   Procedure: ABLATION ON ENDOMETRIOSIS;  Surgeon: Mitchel Honour, DO;  Location: [facility];  Service: Gynecology;  Laterality: N/A;   ANTERIOR AND POSTERIOR REPAIR WITH SACROSPINOUS FIXATION N/A 06/29/2021   Procedure: ANTERIOR AND POSTERIOR REPAIR;  Surgeon: Mitchel Honour, DO;  Location: [facility];  Service: Gynecology;  Laterality: N/A;   bravo test  03/11/2021   COLONOSCOPY  2019   16yr f/u   CONDYLOMA EXCISION/FULGURATION  10/04/2012   Procedure: CONDYLOMA REMOVAL;  Surgeon: Mitchel Honour, DO;  Location: [facility];  Service: Gynecology;  Laterality: N/A;   CYSTOSCOPY N/A 06/29/2021   Procedure: CYSTOSCOPY;  Surgeon: Mitchel Honour, DO;  Location: [facility];  Service: Gynecology;  Laterality: N/A;   DILITATION & CURRETTAGE/HYSTROSCOPY WITH NOVASURE ABLATION  10/04/2012   Procedure: DILATATION & CURETTAGE/HYSTEROSCOPY WITH NOVASURE ABLATION;  Surgeon: Mitchel Honour, DO;  Location: [facility];  Service: Gynecology;  Laterality: N/A;   ESOPHAGEAL MANOMETRY N/A 04/01/2019   Procedure: ESOPHAGEAL MANOMETRY (EM);  Surgeon: Napoleon Form, MD;  Location: [facility];  Service: Endoscopy;  Laterality: N/A;   HEMORRHOID BANDING     multiple times yrs ago   LAPAROSCOPIC NISSEN FUNDOPLICATION N/A 09/05/2019   Procedure: LAPAROSCOPIC HIATAL HERNIA REPAIR AND NISSEN FUNDOPLICATION;  Surgeon: Gaynelle Adu, MD;  Location: [facility];  Service: General;  Laterality: N/A;   LAPAROSCOPIC VAGINAL HYSTERECTOMY WITH SALPINGECTOMY Bilateral 06/29/2021   Procedure: LAPAROSCOPIC ASSISTED VAGINAL HYSTERECTOMY WITH BIILATERAL SALPINGECTOMY; CAUTERY OF VULVAR HEMANGIOMAS AND EXCISION OF LESION AT INTROITUS;  Surgeon: Mitchel Honour, DO;  Location: [facility];  Service: Gynecology;  Laterality: Bilateral;   LAPAROSCOPY  10/04/2012  Procedure: LAPAROSCOPY OPERATIVE;  Surgeon: Mitchel Honour, DO;  Location: [facility];  Service: Gynecology;  Laterality: N/A;   MASS EXCISION Bilateral 09/05/2019   Procedure: EXCISION BILATERAL SOFT TISSUE MASSES, THIGH;  Surgeon: Gaynelle Adu, MD;  Location: [facility];  Service: General;  Laterality: Bilateral;   RADIOFREQUENCY ABLATION NERVES     sciatic nerve last 3 to 4 vertebrate lower and spine and sciatic nerve both legs   UPPER GI ENDOSCOPY  04/01/2019     Prior to Admission medications   Medication Sig Start Date End Date Taking? Authorizing Provider  Abatacept (ORENCIA IV) Inject into the vein. Every two weeks for loading doses and then transition once a month   Yes [provider]  cetirizine (ZYRTEC) 10 MG tablet Take 10 mg by mouth daily.   Yes [provider]  cyclobenzaprine (FLEXERIL) 5 MG tablet Take 5 mg by mouth as needed. 11/21/21  Yes [provider]  DULoxetine (CYMBALTA) 60 MG capsule Take 90 mg by mouth daily. 04/28/22  Yes [provider]  estradiol (CLIMARA - DOSED IN MG/24 HR) 0.05 mg/24hr patch Place 0.05 mg onto the skin once a week. 12/20/23  Yes [provider]  lamoTRIgine (LAMICTAL) 25 MG tablet Take 2 tablets (50 mg total) by mouth 2 (two) times daily. 07/12/23  Yes Sater, Pearletha Furl, MD  leflunomide (ARAVA) 20 MG tablet Take 10 mg by mouth daily. Patient takes 10 mg only 03/30/22  Yes [provider]  LORazepam (ATIVAN) 1 MG tablet Take 1 mg by mouth as needed for anxiety. 04/28/22  Yes [provider]  clotrimazole (MYCELEX) 10 MG troche Take 1 tablet (10 mg total) by mouth 4 (four) times daily as needed. Patient taking differently: Take 10 mg by mouth as needed. 09/16/22   Napoleon Form, MD  dexamethasone (DECADRON) 2 MG tablet Take 2 mg by mouth as needed (RA). Patient not taking: Reported on 01/17/2024 04/02/22   [provider]  EPINEPHrine 0.3 mg/0.3 mL IJ SOAJ injection Inject 0.3 mg into the muscle as needed for anaphylaxis.    [provider]  ondansetron (ZOFRAN-ODT) 4 MG disintegrating tablet Take 4 mg by mouth as needed for nausea or vomiting. Patient not taking: Reported on 01/17/2024 12/21/21   [provider]  traZODone (DESYREL) 50 MG tablet 1-2 tablet at bedtime as needed Orally Once a day    [provider]  zolmitriptan (ZOMIG) 5 MG nasal solution PLACE 1 SPRAY INTO THE NOSE AS NEEDED FOR MIGRAINE. 04/27/22    Pincus Sanes, MD    Current Outpatient Medications  Medication Sig Dispense Refill   Abatacept (ORENCIA IV) Inject into the vein. Every two weeks for loading doses and then transition once a month     cetirizine (ZYRTEC) 10 MG tablet Take 10 mg by mouth daily.     cyclobenzaprine (FLEXERIL) 5 MG tablet Take 5 mg by mouth as needed.     DULoxetine (CYMBALTA) 60 MG capsule Take 90 mg by mouth daily.     estradiol (CLIMARA - DOSED IN MG/24 HR) 0.05 mg/24hr patch Place 0.05 mg onto the skin once a week.     lamoTRIgine (LAMICTAL) 25 MG tablet Take 2 tablets (50 mg total) by mouth 2 (two) times daily. 120 tablet 4   leflunomide (ARAVA) 20 MG tablet Take 10 mg by mouth daily. Patient takes 10 mg only     LORazepam (ATIVAN) 1 MG tablet Take 1 mg by mouth as needed for anxiety.  clotrimazole (MYCELEX) 10 MG troche Take 1 tablet (10 mg total) by mouth 4 (four) times daily as needed. (Patient taking differently: Take 10 mg by mouth as needed.) 70 Troche 0   dexamethasone (DECADRON) 2 MG tablet Take 2 mg by mouth as needed (RA). (Patient not taking: Reported on 01/17/2024)     EPINEPHrine 0.3 mg/0.3 mL IJ SOAJ injection Inject 0.3 mg into the muscle as needed for anaphylaxis.     ondansetron (ZOFRAN-ODT) 4 MG disintegrating tablet Take 4 mg by mouth as needed for nausea or vomiting. (Patient not taking: Reported on 01/17/2024)     traZODone (DESYREL) 50 MG tablet 1-2 tablet at bedtime as needed Orally Once a day     zolmitriptan (ZOMIG) 5 MG nasal solution PLACE 1 SPRAY INTO THE NOSE AS NEEDED FOR MIGRAINE. 6 each 5   Current Facility-Administered Medications  Medication Dose Route Frequency Provider Last Rate Last Admin   0.9 %  sodium chloride infusion  500 mL Intravenous Once Napoleon Form, MD        Allergies as of 02/06/2024 - Review Complete 02/06/2024  Allergen Reaction Noted   Capsaicin Cough, Other (See Comments), Diarrhea, and Nausea Only 03/31/2006   Piper Other (See Comments)  05/05/2022   Nsaids  08/30/2019   Atorvastatin Other (See Comments) 12/03/2021   Black pepper-turmeric Other (See Comments) 11/23/2021   Flagyl [metronidazole] Rash 09/11/2012   Methotrexate Other (See Comments) 05/05/2022   Other Other (See Comments) 09/19/2018   Prednisone Anxiety and Other (See Comments) 03/17/2022   Rosuvastatin Other (See Comments) 12/29/2021    Family History  Problem Relation Age of Onset   Heart disease Mother    Arthritis Mother    Hyperlipidemia Mother    Hypertension Mother    Heart attack Mother    Colon polyps Father    Colon polyps Sister    Hashimoto's thyroiditis Sister    Diabetes Sister    Thyroid disease Sister    Thyroid disease Sister    Colon cancer Paternal Grandfather    Rectal cancer Neg Hx    Stomach cancer Neg Hx     Social History   Socioeconomic History   Marital status: Married    Spouse name: [name]   Number of children: 2   Years of education: 12+   Highest education level: Some college, no degree  Occupational History   Occupation: adm Teaching laboratory technician: [employer]  Tobacco Use   Smoking status: Never   Smokeless tobacco: Never  Vaping Use   Vaping status: Never Used  Substance and Sexual Activity   Alcohol use: Not Currently   Drug use: No   Sexual activity: Yes  Other Topics Concern   Not on file  Social History Narrative   Regular exercise-no   Caffeine Use-yes      Right handed   Wear glasses   Social Drivers of Health   Financial Resource Strain: Patient Declined (12/27/2023)   Overall Financial Resource Strain (CARDIA)    Difficulty of Paying Living Expenses: Patient declined  Food Insecurity: Patient Declined (12/27/2023)   Hunger Vital Sign    Worried About Running Out of Food in the Last Year: Patient declined    Ran Out of Food in the Last Year: Patient declined  Transportation Needs: No Transportation Needs (12/27/2023)   PRAPARE - Administrator, Civil Service  (Medical): No    Lack of Transportation (Non-Medical): No  Physical Activity: Unknown (12/27/2023)  Exercise Vital Sign    Days of Exercise per Week: 0 days    Minutes of Exercise per Session: Not on file  Stress: No Stress Concern Present (12/27/2023)   [employer] of Occupational Health - Occupational Stress Questionnaire    Feeling of Stress : Only a little  Social Connections: Unknown (12/27/2023)   Social Connection and Isolation Panel [NHANES]    Frequency of Communication with Friends and Family: Once a week    Frequency of Social Gatherings with Friends and Family: Never    Attends Religious Services: Patient declined    Database administrator or Organizations: No    Attends Engineer, structural: Not on file    Marital Status: Married  Catering manager Violence: Not on file    Review of Systems:  All other review of systems negative except as mentioned in the HPI.  Physical Exam: Vital signs in last 24 hours: BP (!) 142/100   Pulse (!) 117   Temp 98.2 F (36.8 C)   Ht 5\' 7"  (1.702 m)   Wt 191 lb (86.6 kg)   LMP 01/19/2020   SpO2 98%   BMI 29.91 kg/m  General:   Alert, NAD Lungs:  Clear .   Heart:  Regular rate and rhythm Abdomen:  Soft, nontender and nondistended. Neuro/Psych:  Alert and cooperative. Normal mood and affect. A and O x 3  Reviewed labs, radiology imaging, old records and pertinent past GI work up  Patient is appropriate for planned procedure(s) and anesthesia in an ambulatory setting   K. Scherry Ran , MD [phone]

## 2024-02-07 ENCOUNTER — Telehealth: Payer: Self-pay

## 2024-02-07 NOTE — Telephone Encounter (Signed)
  Follow up Call-     02/06/2024   10:06 AM  Call back number  Post procedure Call Back phone  # [phone]  Permission to leave phone message Yes     Patient questions:  Do you have a fever, pain , or abdominal swelling? No. Pain Score  0 *  Have you tolerated food without any problems? Yes.    Have you been able to return to your normal activities? Yes.    Do you have any questions about your discharge instructions: Diet   No. Medications  No. Follow up visit  No.  Do you have questions or concerns about your Care? No.  Actions: * If pain score is 4 or above: No action needed, pain <4.

## 2024-02-09 ENCOUNTER — Encounter: Payer: Self-pay | Admitting: Internal Medicine

## 2024-02-09 DIAGNOSIS — Z860101 Personal history of adenomatous and serrated colon polyps: Secondary | ICD-10-CM | POA: Insufficient documentation

## 2024-02-09 LAB — SURGICAL PATHOLOGY

## 2024-02-27 DIAGNOSIS — M0609 Rheumatoid arthritis without rheumatoid factor, multiple sites: Secondary | ICD-10-CM | POA: Diagnosis not present

## 2024-03-14 ENCOUNTER — Ambulatory Visit: Payer: Self-pay | Admitting: Gastroenterology

## 2024-03-26 DIAGNOSIS — M0609 Rheumatoid arthritis without rheumatoid factor, multiple sites: Secondary | ICD-10-CM | POA: Diagnosis not present

## 2024-03-28 DIAGNOSIS — M1990 Unspecified osteoarthritis, unspecified site: Secondary | ICD-10-CM | POA: Diagnosis not present

## 2024-03-28 DIAGNOSIS — Z79899 Other long term (current) drug therapy: Secondary | ICD-10-CM | POA: Diagnosis not present

## 2024-03-28 DIAGNOSIS — M797 Fibromyalgia: Secondary | ICD-10-CM | POA: Diagnosis not present

## 2024-03-28 DIAGNOSIS — M0609 Rheumatoid arthritis without rheumatoid factor, multiple sites: Secondary | ICD-10-CM | POA: Diagnosis not present

## 2024-04-23 DIAGNOSIS — M0609 Rheumatoid arthritis without rheumatoid factor, multiple sites: Secondary | ICD-10-CM | POA: Diagnosis not present

## 2024-05-04 ENCOUNTER — Other Ambulatory Visit: Payer: Self-pay | Admitting: Neurology

## 2024-05-06 DIAGNOSIS — M25562 Pain in left knee: Secondary | ICD-10-CM | POA: Diagnosis not present

## 2024-05-06 NOTE — Telephone Encounter (Signed)
 Last seen on 06/15/23 No follow up scheduled   We discussed that if she tolerates the dose going up to 50 mg twice a day that she could call and we could increase the dose. I did not schedule follow-up but could see her back if she has significant new or worsening neurologic symptoms.

## 2024-05-17 ENCOUNTER — Telehealth: Payer: Self-pay | Admitting: Neurology

## 2024-05-17 ENCOUNTER — Encounter: Payer: Self-pay | Admitting: Advanced Practice Midwife

## 2024-05-17 ENCOUNTER — Telehealth: Payer: Self-pay

## 2024-05-17 ENCOUNTER — Other Ambulatory Visit: Payer: Self-pay

## 2024-05-17 DIAGNOSIS — E7849 Other hyperlipidemia: Secondary | ICD-10-CM

## 2024-05-17 DIAGNOSIS — R7303 Prediabetes: Secondary | ICD-10-CM

## 2024-05-17 NOTE — Telephone Encounter (Signed)
 Copied from CRM [id number]. Topic: Clinical - Request for Lab/Test Order >> May 17, 2024  9:35 AM Robinson DEL wrote: Reason for CRM: Patient has an appointment for her physical on 8/25 at 1:20 would like to have labs done prior since appointment is later in day.  Josue [phone]

## 2024-05-17 NOTE — Telephone Encounter (Signed)
 Pt has called to inquire about a refill on the medication, the message was relayed to her from this entry, pt states she feels stable and the medication provides a lot of relief, she would like to know if Dr Vear based on that will refill or if she needs to reach out to her PCP, please call.

## 2024-05-20 NOTE — Telephone Encounter (Signed)
 Sent mychart message

## 2024-05-21 DIAGNOSIS — M0609 Rheumatoid arthritis without rheumatoid factor, multiple sites: Secondary | ICD-10-CM | POA: Diagnosis not present

## 2024-05-21 NOTE — Telephone Encounter (Signed)
 Error

## 2024-05-25 DIAGNOSIS — M25562 Pain in left knee: Secondary | ICD-10-CM | POA: Diagnosis not present

## 2024-06-05 DIAGNOSIS — M25562 Pain in left knee: Secondary | ICD-10-CM | POA: Diagnosis not present

## 2024-06-18 DIAGNOSIS — M0609 Rheumatoid arthritis without rheumatoid factor, multiple sites: Secondary | ICD-10-CM | POA: Diagnosis not present

## 2024-06-19 ENCOUNTER — Other Ambulatory Visit (INDEPENDENT_AMBULATORY_CARE_PROVIDER_SITE_OTHER)

## 2024-06-19 DIAGNOSIS — R7303 Prediabetes: Secondary | ICD-10-CM

## 2024-06-19 DIAGNOSIS — E7849 Other hyperlipidemia: Secondary | ICD-10-CM

## 2024-06-19 LAB — COMPREHENSIVE METABOLIC PANEL WITH GFR
ALT: 15 U/L (ref 0–35)
AST: 18 U/L (ref 0–37)
Albumin: 4.4 g/dL (ref 3.5–5.2)
Alkaline Phosphatase: 69 U/L (ref 39–117)
BUN: 13 mg/dL (ref 6–23)
CO2: 29 meq/L (ref 19–32)
Calcium: 9.2 mg/dL (ref 8.4–10.5)
Chloride: 104 meq/L (ref 96–112)
Creatinine, Ser: 0.87 mg/dL (ref 0.40–1.20)
GFR: 75.35 mL/min (ref >60.00)
Glucose, Bld: 104 mg/dL — ABNORMAL HIGH (ref 70–99)
Potassium: 4.4 meq/L (ref 3.5–5.1)
Sodium: 140 meq/L (ref 135–145)
Total Bilirubin: 0.5 mg/dL (ref 0.2–1.2)
Total Protein: 6.9 g/dL (ref 6.0–8.3)

## 2024-06-19 LAB — CBC WITH DIFFERENTIAL/PLATELET
Basophils Absolute: 0.1 K/uL (ref 0.0–0.1)
Basophils Relative: 1.2 % (ref 0.0–3.0)
Eosinophils Absolute: 0.2 K/uL (ref 0.0–0.7)
Eosinophils Relative: 4.3 % (ref 0.0–5.0)
HCT: 43.3 % (ref 36.0–46.0)
Hemoglobin: 14.2 g/dL (ref 12.0–15.0)
Lymphocytes Relative: 14.9 % (ref 12.0–46.0)
Lymphs Abs: 0.7 K/uL (ref 0.7–4.0)
MCHC: 32.7 g/dL (ref 30.0–36.0)
MCV: 85.7 fl (ref 78.0–100.0)
Monocytes Absolute: 0.5 K/uL (ref 0.1–1.0)
Monocytes Relative: 9.4 % (ref 3.0–12.0)
Neutro Abs: 3.5 K/uL (ref 1.4–7.7)
Neutrophils Relative %: 70.2 % (ref 43.0–77.0)
Platelets: 256 K/uL (ref 150.0–400.0)
RBC: 5.06 Mil/uL (ref 3.87–5.11)
RDW: 13.8 % (ref 11.5–15.5)
WBC: 5 K/uL (ref 4.0–10.5)

## 2024-06-19 LAB — TSH: TSH: 1.29 u[IU]/mL (ref 0.35–5.50)

## 2024-06-19 LAB — LIPID PANEL
Cholesterol: 269 mg/dL — ABNORMAL HIGH (ref 0–200)
HDL: 56.5 mg/dL (ref >39.00)
LDL Cholesterol: 190 mg/dL — ABNORMAL HIGH (ref 0–99)
NonHDL: 212.51
Total CHOL/HDL Ratio: 5
Triglycerides: 115 mg/dL (ref 0.0–149.0)
VLDL: 23 mg/dL (ref 0.0–40.0)

## 2024-06-19 LAB — HEMOGLOBIN A1C: Hgb A1c MFr Bld: 6.2 % (ref 4.6–6.5)

## 2024-06-23 ENCOUNTER — Encounter: Payer: Self-pay | Admitting: Internal Medicine

## 2024-06-23 NOTE — Progress Notes (Unsigned)
 Subjective:    Patient ID: Pamela Novak, female    DOB: Jan 03, 1971, 53 y.o.   MRN: [id number]      HPI Pamela Novak is here for a Physical exam and her chronic medical problems.   Had labs done.  CT CAC 0, LDL 190  Left knee meniscus tear.  Had an injection which helped.  Her pain is better.  She would like to avoid surgery.  She is frustrated because she was walking regularly and then when the pain started and she has not been able to exercise.   Medications and allergies reviewed with patient and updated if appropriate.  Current Outpatient Medications on File Prior to Visit  Medication Sig Dispense Refill   Abatacept  (ORENCIA  IV) Inject into the vein. Every two weeks for loading doses and then transition once a month     cetirizine (ZYRTEC) 10 MG tablet Take 10 mg by mouth daily.     cyclobenzaprine  (FLEXERIL ) 5 MG tablet Take 5 mg by mouth as needed.     DULoxetine  (CYMBALTA ) 60 MG capsule Take 90 mg by mouth daily.     estradiol  (CLIMARA  - DOSED IN MG/24 HR) 0.05 mg/24hr patch Place 0.05 mg onto the skin once a week.     leflunomide (ARAVA) 20 MG tablet Take 10 mg by mouth daily. Patient takes 10 mg only     LORazepam  (ATIVAN ) 1 MG tablet Take 1 mg by mouth as needed for anxiety.     zolmitriptan  (ZOMIG ) 5 MG nasal solution PLACE 1 SPRAY INTO THE NOSE AS NEEDED FOR MIGRAINE. 6 each 5   No current facility-administered medications on file prior to visit.    Review of Systems  Constitutional:  Positive for fatigue (waves - ? related to RA or medication). Negative for fever.  Eyes:  Negative for visual disturbance.  Respiratory:  Negative for cough, shortness of breath and wheezing.   Cardiovascular:  Positive for chest pain (occ with anxiety). Negative for palpitations and leg swelling.  Gastrointestinal:  Positive for constipation and diarrhea (alternating BM). Negative for abdominal pain and blood in stool.       Occ reflux  Genitourinary:  Negative for dysuria.   Musculoskeletal:  Positive for arthralgias (left knee pain) and back pain (occasional - improved after hysterectomy).       Occ muscle aches, spasms  Skin:  Negative for rash.  Neurological:  Positive for headaches (migraines - controlled). Negative for light-headedness.  Psychiatric/Behavioral:  Positive for dysphoric mood. The patient is nervous/anxious.        Objective:   Vitals:   06/24/24 1314  BP: 120/76  Pulse: 87  Temp: 98.1 F (36.7 C)  SpO2: 97%   Filed Weights   06/24/24 1314  Weight: 187 lb (84.8 kg)   Body mass index is 29.29 kg/m.  BP Readings from Last 3 Encounters:  06/24/24 120/76  02/06/24 118/77  12/27/23 118/82    Wt Readings from Last 3 Encounters:  06/24/24 187 lb (84.8 kg)  02/06/24 191 lb (86.6 kg)  01/17/24 191 lb (86.6 kg)       Physical Exam Constitutional: She appears well-developed and well-nourished. No distress.  HENT:  Head: Normocephalic and atraumatic.  Right Ear: External ear normal. Normal ear canal and TM Left Ear: External ear normal.  Normal ear canal and TM Mouth/Throat: Oropharynx is clear and moist.  Eyes: Conjunctivae normal.  Neck: Neck supple. No tracheal deviation present. No thyromegaly present.  No carotid bruit  Cardiovascular:  Normal rate, regular rhythm and normal heart sounds.   No murmur heard.  No edema. Pulmonary/Chest: Effort normal and breath sounds normal. No respiratory distress. She has no wheezes. She has no rales.  Breast: deferred   Abdominal: Soft. She exhibits no distension. There is no tenderness.  Lymphadenopathy: She has no cervical adenopathy.  Skin: Skin is warm and dry. She is not diaphoretic.  Psychiatric: She has a normal mood and affect. Her behavior is normal.     Lab Results  Component Value Date   WBC 5.0 06/19/2024   HGB 14.2 06/19/2024   HCT 43.3 06/19/2024   PLT 256.0 06/19/2024   GLUCOSE 104 (H) 06/19/2024   CHOL 269 (H) 06/19/2024   TRIG 115.0 06/19/2024   HDL  56.50 06/19/2024   LDLDIRECT 114.0 01/18/2022   LDLCALC 190 (H) 06/19/2024   ALT 15 06/19/2024   AST 18 06/19/2024   NA 140 06/19/2024   K 4.4 06/19/2024   CL 104 06/19/2024   CREATININE 0.87 06/19/2024   BUN 13 06/19/2024   CO2 29 06/19/2024   TSH 1.29 06/19/2024   HGBA1C 6.2 06/19/2024         Assessment & Plan:   Physical exam: Screening blood work  ordered Exercise  not able to exercise now related to knee pain - torn meniscus - will start walking when able Weight  overweight - working on weight loss Substance abuse  none   Reviewed recommended immunizations.   Health Maintenance  Topic Date Due   Hepatitis B Vaccines 19-59 Average Risk (1 of 3 - 19+ 3-dose series) Never done   COVID-19 Vaccine (6 - 2024-25 season) 07/02/2023   INFLUENZA VACCINE  05/31/2024   MAMMOGRAM  06/07/2025   DTaP/Tdap/Td (2 - Td or Tdap) 01/17/2026   Colonoscopy  02/05/2029   Pneumococcal Vaccine: 50+ Years  Completed   Hepatitis C Screening  Completed   HIV Screening  Completed   HPV VACCINES  Aged Out   Meningococcal B Vaccine  Aged Out   Zoster Vaccines- Shingrix  Discontinued          See Problem List for Assessment and Plan of chronic medical problems.

## 2024-06-23 NOTE — Patient Instructions (Addendum)
 Medications changes include :   None     Return in about 6 months (around 12/25/2024) for follow up.     Health Maintenance, Female Adopting a healthy lifestyle and getting preventive care are important in promoting health and wellness. Ask your health care provider about: The right schedule for you to have regular tests and exams. Things you can do on your own to prevent diseases and keep yourself healthy. What should I know about diet, weight, and exercise? Eat a healthy diet  Eat a diet that includes plenty of vegetables, fruits, low-fat dairy products, and lean protein. Do not eat a lot of foods that are high in solid fats, added sugars, or sodium. Maintain a healthy weight Body mass index (BMI) is used to identify weight problems. It estimates body fat based on height and weight. Your health care provider can help determine your BMI and help you achieve or maintain a healthy weight. Get regular exercise Get regular exercise. This is one of the most important things you can do for your health. Most adults should: Exercise for at least 150 minutes each week. The exercise should increase your heart rate and make you sweat (moderate-intensity exercise). Do strengthening exercises at least twice a week. This is in addition to the moderate-intensity exercise. Spend less time sitting. Even light physical activity can be beneficial. Watch cholesterol and blood lipids Have your blood tested for lipids and cholesterol at 55 years of age, then have this test every 5 years. Have your cholesterol levels checked more often if: Your lipid or cholesterol levels are high. You are older than 55 years of age. You are at high risk for heart disease. What should I know about cancer screening? Depending on your health history and family history, you may need to have cancer screening at various ages. This may include screening for: Breast cancer. Cervical cancer. Colorectal cancer. Skin  cancer. Lung cancer. What should I know about heart disease, diabetes, and high blood pressure? Blood pressure and heart disease High blood pressure causes heart disease and increases the risk of stroke. This is more likely to develop in people who have high blood pressure readings or are overweight. Have your blood pressure checked: Every 3-5 years if you are 62-21 years of age. Every year if you are 89 years old or older. Diabetes Have regular diabetes screenings. This checks your fasting blood sugar level. Have the screening done: Once every three years after age 30 if you are at a normal weight and have a low risk for diabetes. More often and at a younger age if you are overweight or have a high risk for diabetes. What should I know about preventing infection? Hepatitis B If you have a higher risk for hepatitis B, you should be screened for this virus. Talk with your health care provider to find out if you are at risk for hepatitis B infection. Hepatitis C Testing is recommended for: Everyone born from [birth year] through 1967. Anyone with known risk factors for hepatitis C. Sexually transmitted infections (STIs) Get screened for STIs, including gonorrhea and chlamydia, if: You are sexually active and are younger than 55 years of age. You are older than 55 years of age and your health care provider tells you that you are at risk for this type of infection. Your sexual activity has changed since you were last screened, and you are at increased risk for chlamydia or gonorrhea. Ask your health care provider if you are  at risk. Ask your health care provider about whether you are at high risk for HIV. Your health care provider may recommend a prescription medicine to help prevent HIV infection. If you choose to take medicine to prevent HIV, you should first get tested for HIV. You should then be tested every 3 months for as long as you are taking the medicine. Pregnancy If you are about to stop  having your period (premenopausal) and you may become pregnant, seek counseling before you get pregnant. Take 400 to 800 micrograms (mcg) of folic acid every day if you become pregnant. Ask for birth control (contraception) if you want to prevent pregnancy. Osteoporosis and menopause Osteoporosis is a disease in which the bones lose minerals and strength with aging. This can result in bone fractures. If you are 63 years old or older, or if you are at risk for osteoporosis and fractures, ask your health care provider if you should: Be screened for bone loss. Take a calcium  or vitamin D  supplement to lower your risk of fractures. Be given hormone replacement therapy (HRT) to treat symptoms of menopause. Follow these instructions at home: Alcohol use Do not drink alcohol if: Your health care provider tells you not to drink. You are pregnant, may be pregnant, or are planning to become pregnant. If you drink alcohol: Limit how much you have to: 0-1 drink a day. Know how much alcohol is in your drink. In the [place] one drink equals one 12 oz bottle of beer (355 mL), one 5 oz glass of wine (148 mL), or one 1 oz glass of hard liquor (44 mL). Lifestyle Do not use any products that contain nicotine or tobacco. These products include cigarettes, chewing tobacco, and vaping devices, such as e-cigarettes. If you need help quitting, ask your health care provider. Do not use street drugs. Do not share needles. Ask your health care provider for help if you need support or information about quitting drugs. General instructions Schedule regular health, dental, and eye exams. Stay current with your vaccines. Tell your health care provider if: You often feel depressed. You have ever been abused or do not feel safe at home. Summary Adopting a healthy lifestyle and getting preventive care are important in promoting health and wellness. Follow your health care provider's instructions about healthy diet,  exercising, and getting tested or screened for diseases. Follow your health care provider's instructions on monitoring your cholesterol and blood pressure. This information is not intended to replace advice given to you by your health care provider. Make sure you discuss any questions you have with your health care provider. Document Revised: 03/08/2021 Document Reviewed: 03/08/2021 Elsevier Patient Education  2024 [employer].

## 2024-06-24 ENCOUNTER — Ambulatory Visit: Admitting: Internal Medicine

## 2024-06-24 VITALS — BP 120/76 | HR 87 | Temp 98.1°F | Ht 67.0 in | Wt 187.0 lb

## 2024-06-24 DIAGNOSIS — R208 Other disturbances of skin sensation: Secondary | ICD-10-CM

## 2024-06-24 DIAGNOSIS — E559 Vitamin D deficiency, unspecified: Secondary | ICD-10-CM

## 2024-06-24 DIAGNOSIS — F4310 Post-traumatic stress disorder, unspecified: Secondary | ICD-10-CM

## 2024-06-24 DIAGNOSIS — F3289 Other specified depressive episodes: Secondary | ICD-10-CM

## 2024-06-24 DIAGNOSIS — R7303 Prediabetes: Secondary | ICD-10-CM

## 2024-06-24 DIAGNOSIS — M791 Myalgia, unspecified site: Secondary | ICD-10-CM

## 2024-06-24 DIAGNOSIS — G43001 Migraine without aura, not intractable, with status migrainosus: Secondary | ICD-10-CM | POA: Diagnosis not present

## 2024-06-24 DIAGNOSIS — Z0000 Encounter for general adult medical examination without abnormal findings: Secondary | ICD-10-CM

## 2024-06-24 DIAGNOSIS — E7849 Other hyperlipidemia: Secondary | ICD-10-CM

## 2024-06-24 DIAGNOSIS — F411 Generalized anxiety disorder: Secondary | ICD-10-CM

## 2024-06-24 DIAGNOSIS — M0609 Rheumatoid arthritis without rheumatoid factor, multiple sites: Secondary | ICD-10-CM

## 2024-06-24 MED ORDER — LAMOTRIGINE 25 MG PO TABS: 50.0000 mg | ORAL_TABLET | Freq: Two times a day (BID) | ORAL | 5 refills | Status: AC

## 2024-06-24 MED ORDER — CYCLOBENZAPRINE HCL 5 MG PO TABS: 5.0000 mg | ORAL_TABLET | Freq: Every evening | ORAL | 0 refills | Status: AC | PRN

## 2024-06-24 NOTE — Assessment & Plan Note (Addendum)
 Chronic Regular exercise and healthy diet encouraged CT CAC 0, LDL 190 Did not tolerate Crestor -caused muscle pain LDL very high and should be on medication LDL is slightly better-was exercising regularly until she injured her knee-Will recheck in 6 months and hopefully she will be able to resume exercise in that time and work on weight loss

## 2024-06-24 NOTE — Assessment & Plan Note (Signed)
 Chronic, intermittent Related to certain activities ? Fibromyalgia Continue flexeril  5 mg daily prn - does not take often

## 2024-06-24 NOTE — Assessment & Plan Note (Signed)
Chronic Migraines controlled Continue Zomig 5 mg nasal spray as needed, Zofran 4 mg ODT as needed

## 2024-06-24 NOTE — Assessment & Plan Note (Addendum)
 Following with psychiatry  Currently on duloxetine , lorazepam  and trazodone  for sleep

## 2024-06-24 NOTE — Assessment & Plan Note (Addendum)
 Chronic Has seen neurology and started on lamictal  which helped No longer needs to see neurology, so I will manage Will restart lamictal  50 mg bid-Will start 25 mg and increase to 50 mg twice daily

## 2024-06-24 NOTE — Assessment & Plan Note (Addendum)
 Chronic Lab Results  Component Value Date   HGBA1C 6.2 06/19/2024   Low sugar / carb diet Stressed regular exercise

## 2024-06-24 NOTE — Assessment & Plan Note (Addendum)
 Chronic Management per psychiatry On duloxetine , also on Lamictal  which was helping

## 2024-06-24 NOTE — Assessment & Plan Note (Signed)
 Chronic Following with rheumatology On arava, Orencia  On dexamethasone  as needed for flares

## 2024-06-24 NOTE — Assessment & Plan Note (Signed)
Chronic Management per psychiatry On duloxetine, lorazepam

## 2024-06-24 NOTE — Assessment & Plan Note (Signed)
 Chronic Taking vitamin D  daily  Last vitamin D  Lab Results  Component Value Date   VD25OH 39.76 06/23/2023

## 2024-06-26 DIAGNOSIS — M25562 Pain in left knee: Secondary | ICD-10-CM | POA: Diagnosis not present

## 2024-06-26 DIAGNOSIS — M6281 Muscle weakness (generalized): Secondary | ICD-10-CM | POA: Diagnosis not present

## 2024-06-26 DIAGNOSIS — M25662 Stiffness of left knee, not elsewhere classified: Secondary | ICD-10-CM | POA: Diagnosis not present

## 2024-06-28 DIAGNOSIS — M1990 Unspecified osteoarthritis, unspecified site: Secondary | ICD-10-CM | POA: Diagnosis not present

## 2024-06-28 DIAGNOSIS — M797 Fibromyalgia: Secondary | ICD-10-CM | POA: Diagnosis not present

## 2024-06-28 DIAGNOSIS — M0609 Rheumatoid arthritis without rheumatoid factor, multiple sites: Secondary | ICD-10-CM | POA: Diagnosis not present

## 2024-06-28 DIAGNOSIS — Z79899 Other long term (current) drug therapy: Secondary | ICD-10-CM | POA: Diagnosis not present

## 2024-07-04 DIAGNOSIS — F4310 Post-traumatic stress disorder, unspecified: Secondary | ICD-10-CM | POA: Diagnosis not present

## 2024-07-04 DIAGNOSIS — F900 Attention-deficit hyperactivity disorder, predominantly inattentive type: Secondary | ICD-10-CM | POA: Diagnosis not present

## 2024-07-04 DIAGNOSIS — F411 Generalized anxiety disorder: Secondary | ICD-10-CM | POA: Diagnosis not present

## 2024-07-04 DIAGNOSIS — F332 Major depressive disorder, recurrent severe without psychotic features: Secondary | ICD-10-CM | POA: Diagnosis not present

## 2024-07-16 DIAGNOSIS — M0609 Rheumatoid arthritis without rheumatoid factor, multiple sites: Secondary | ICD-10-CM | POA: Diagnosis not present

## 2024-08-13 DIAGNOSIS — M0609 Rheumatoid arthritis without rheumatoid factor, multiple sites: Secondary | ICD-10-CM | POA: Diagnosis not present

## 2024-09-10 DIAGNOSIS — M0609 Rheumatoid arthritis without rheumatoid factor, multiple sites: Secondary | ICD-10-CM | POA: Diagnosis not present

## 2024-09-30 DIAGNOSIS — F4310 Post-traumatic stress disorder, unspecified: Secondary | ICD-10-CM | POA: Diagnosis not present

## 2024-09-30 DIAGNOSIS — F332 Major depressive disorder, recurrent severe without psychotic features: Secondary | ICD-10-CM | POA: Diagnosis not present

## 2024-09-30 DIAGNOSIS — F900 Attention-deficit hyperactivity disorder, predominantly inattentive type: Secondary | ICD-10-CM | POA: Diagnosis not present

## 2024-09-30 DIAGNOSIS — F411 Generalized anxiety disorder: Secondary | ICD-10-CM | POA: Diagnosis not present

## 2024-10-21 DIAGNOSIS — M797 Fibromyalgia: Secondary | ICD-10-CM | POA: Diagnosis not present

## 2024-10-21 DIAGNOSIS — M0609 Rheumatoid arthritis without rheumatoid factor, multiple sites: Secondary | ICD-10-CM | POA: Diagnosis not present

## 2024-10-21 DIAGNOSIS — M1990 Unspecified osteoarthritis, unspecified site: Secondary | ICD-10-CM | POA: Diagnosis not present

## 2024-10-21 DIAGNOSIS — Z79899 Other long term (current) drug therapy: Secondary | ICD-10-CM | POA: Diagnosis not present

## 2024-12-25 ENCOUNTER — Ambulatory Visit: Admitting: Internal Medicine
